# Patient Record
Sex: Female | Born: 1950 | Race: White | Hispanic: No | Smoking: Former smoker
Health system: Southern US, Community
[De-identification: ages and names within clinical notes are randomized; demographics above are authoritative.]

## PROBLEM LIST (undated history)

## (undated) DIAGNOSIS — C719 Malignant neoplasm of brain, unspecified: Secondary | ICD-10-CM

## (undated) DIAGNOSIS — IMO0001 Reserved for inherently not codable concepts without codable children: Secondary | ICD-10-CM

## (undated) DIAGNOSIS — C801 Malignant (primary) neoplasm, unspecified: Secondary | ICD-10-CM

## (undated) DIAGNOSIS — F319 Bipolar disorder, unspecified: Secondary | ICD-10-CM

## (undated) DIAGNOSIS — IMO0002 Reserved for concepts with insufficient information to code with codable children: Secondary | ICD-10-CM

## (undated) DIAGNOSIS — R569 Unspecified convulsions: Secondary | ICD-10-CM

## (undated) HISTORY — DX: Reserved for inherently not codable concepts without codable children: IMO0001

## (undated) HISTORY — DX: Reserved for concepts with insufficient information to code with codable children: IMO0002

---

## 2001-03-09 ENCOUNTER — Emergency Department (HOSPITAL_COMMUNITY): Admission: EM | Admit: 2001-03-09 | Discharge: 2001-03-09 | Payer: Self-pay | Admitting: Emergency Medicine

## 2004-08-21 ENCOUNTER — Emergency Department (HOSPITAL_COMMUNITY): Admission: EM | Admit: 2004-08-21 | Discharge: 2004-08-21 | Payer: Self-pay | Admitting: Emergency Medicine

## 2004-11-14 ENCOUNTER — Emergency Department (HOSPITAL_COMMUNITY): Admission: EM | Admit: 2004-11-14 | Discharge: 2004-11-14 | Payer: Self-pay | Admitting: Emergency Medicine

## 2004-11-16 ENCOUNTER — Emergency Department (HOSPITAL_COMMUNITY): Admission: EM | Admit: 2004-11-16 | Discharge: 2004-11-17 | Payer: Self-pay | Admitting: Emergency Medicine

## 2004-11-19 ENCOUNTER — Emergency Department (HOSPITAL_COMMUNITY): Admission: EM | Admit: 2004-11-19 | Discharge: 2004-11-19 | Payer: Self-pay | Admitting: *Deleted

## 2004-11-29 ENCOUNTER — Ambulatory Visit: Payer: Self-pay | Admitting: Internal Medicine

## 2004-11-30 ENCOUNTER — Ambulatory Visit: Payer: Self-pay | Admitting: Internal Medicine

## 2004-12-03 ENCOUNTER — Ambulatory Visit: Payer: Self-pay | Admitting: *Deleted

## 2004-12-16 ENCOUNTER — Emergency Department (HOSPITAL_COMMUNITY): Admission: EM | Admit: 2004-12-16 | Discharge: 2004-12-16 | Payer: Self-pay | Admitting: Emergency Medicine

## 2004-12-17 ENCOUNTER — Emergency Department (HOSPITAL_COMMUNITY): Admission: EM | Admit: 2004-12-17 | Discharge: 2004-12-17 | Payer: Self-pay | Admitting: Emergency Medicine

## 2004-12-28 ENCOUNTER — Ambulatory Visit: Payer: Self-pay | Admitting: Internal Medicine

## 2005-02-12 ENCOUNTER — Ambulatory Visit (HOSPITAL_COMMUNITY): Admission: RE | Admit: 2005-02-12 | Discharge: 2005-02-12 | Payer: Self-pay | Admitting: *Deleted

## 2006-08-20 ENCOUNTER — Ambulatory Visit: Payer: Self-pay | Admitting: Internal Medicine

## 2006-12-16 ENCOUNTER — Encounter: Admission: RE | Admit: 2006-12-16 | Discharge: 2006-12-16 | Payer: Self-pay | Admitting: Sports Medicine

## 2006-12-17 ENCOUNTER — Encounter (INDEPENDENT_AMBULATORY_CARE_PROVIDER_SITE_OTHER): Payer: Self-pay | Admitting: Sports Medicine

## 2006-12-17 ENCOUNTER — Encounter (INDEPENDENT_AMBULATORY_CARE_PROVIDER_SITE_OTHER): Payer: Self-pay | Admitting: *Deleted

## 2006-12-19 ENCOUNTER — Ambulatory Visit: Payer: Self-pay | Admitting: Sports Medicine

## 2006-12-19 DIAGNOSIS — F319 Bipolar disorder, unspecified: Secondary | ICD-10-CM | POA: Insufficient documentation

## 2006-12-19 DIAGNOSIS — C3490 Malignant neoplasm of unspecified part of unspecified bronchus or lung: Secondary | ICD-10-CM

## 2006-12-19 DIAGNOSIS — F17200 Nicotine dependence, unspecified, uncomplicated: Secondary | ICD-10-CM

## 2006-12-19 DIAGNOSIS — I10 Essential (primary) hypertension: Secondary | ICD-10-CM | POA: Insufficient documentation

## 2006-12-19 LAB — CONVERTED CEMR LAB
ALT: 15 units/L (ref 0–35)
AST: 15 units/L (ref 0–37)
Albumin: 4.1 g/dL (ref 3.5–5.2)
Alkaline Phosphatase: 94 units/L (ref 39–117)
BUN: 13 mg/dL (ref 6–23)
Chloride: 104 meq/L (ref 96–112)
Glucose, Bld: 107 mg/dL — ABNORMAL HIGH (ref 70–99)
Sodium: 139 meq/L (ref 135–145)
TSH: 1.361 microintl units/mL (ref 0.350–5.50)
Total Bilirubin: 0.6 mg/dL (ref 0.3–1.2)
Total Protein: 8 g/dL (ref 6.0–8.3)

## 2006-12-26 ENCOUNTER — Ambulatory Visit: Payer: Self-pay | Admitting: Internal Medicine

## 2007-01-02 ENCOUNTER — Ambulatory Visit: Payer: Self-pay | Admitting: Internal Medicine

## 2007-01-02 ENCOUNTER — Ambulatory Visit: Admission: RE | Admit: 2007-01-02 | Discharge: 2007-01-02 | Payer: Self-pay | Admitting: Internal Medicine

## 2007-01-02 ENCOUNTER — Encounter (INDEPENDENT_AMBULATORY_CARE_PROVIDER_SITE_OTHER): Payer: Self-pay | Admitting: Specialist

## 2007-01-06 ENCOUNTER — Telehealth (INDEPENDENT_AMBULATORY_CARE_PROVIDER_SITE_OTHER): Payer: Self-pay | Admitting: *Deleted

## 2007-01-08 ENCOUNTER — Ambulatory Visit: Payer: Self-pay | Admitting: Hematology & Oncology

## 2007-01-13 LAB — CBC WITH DIFFERENTIAL/PLATELET
BASO%: 0.3 % (ref 0.0–2.0)
Basophils Absolute: 0 10*3/uL (ref 0.0–0.1)
EOS%: 1.2 % (ref 0.0–7.0)
HCT: 45.5 % (ref 34.8–46.6)
HGB: 15.8 g/dL (ref 11.6–15.9)
MCH: 28.4 pg (ref 26.0–34.0)
MCHC: 34.8 g/dL (ref 32.0–36.0)
MCV: 81.4 fL (ref 81.0–101.0)
MONO%: 5.2 % (ref 0.0–13.0)
NEUT%: 62.7 % (ref 39.6–76.8)
RDW: 14.8 % — ABNORMAL HIGH (ref 11.3–14.5)
lymph#: 2.4 10*3/uL (ref 0.9–3.3)

## 2007-01-13 LAB — COMPREHENSIVE METABOLIC PANEL
ALT: 17 U/L (ref 0–35)
AST: 15 U/L (ref 0–37)
Alkaline Phosphatase: 96 U/L (ref 39–117)
BUN: 14 mg/dL (ref 6–23)
Chloride: 109 mEq/L (ref 96–112)
Creatinine, Ser: 0.81 mg/dL (ref 0.40–1.20)

## 2007-01-22 ENCOUNTER — Ambulatory Visit (HOSPITAL_COMMUNITY): Admission: RE | Admit: 2007-01-22 | Discharge: 2007-01-22 | Payer: Self-pay | Admitting: Hematology & Oncology

## 2007-02-10 ENCOUNTER — Ambulatory Visit (HOSPITAL_COMMUNITY): Admission: RE | Admit: 2007-02-10 | Discharge: 2007-02-10 | Payer: Self-pay | Admitting: Hematology & Oncology

## 2007-02-13 ENCOUNTER — Ambulatory Visit: Admission: RE | Admit: 2007-02-13 | Discharge: 2007-04-30 | Payer: Self-pay | Admitting: Radiation Oncology

## 2007-02-24 ENCOUNTER — Ambulatory Visit: Payer: Self-pay | Admitting: Hematology & Oncology

## 2007-02-24 LAB — CBC WITH DIFFERENTIAL/PLATELET
Basophils Absolute: 0 10*3/uL (ref 0.0–0.1)
EOS%: 0.5 % (ref 0.0–7.0)
Eosinophils Absolute: 0.1 10*3/uL (ref 0.0–0.5)
HCT: 36.6 % (ref 34.8–46.6)
HGB: 12.5 g/dL (ref 11.6–15.9)
LYMPH%: 18.3 % (ref 14.0–48.0)
MCH: 28 pg (ref 26.0–34.0)
MCV: 81.7 fL (ref 81.0–101.0)
MONO%: 1.2 % (ref 0.0–13.0)
NEUT#: 9.3 10*3/uL — ABNORMAL HIGH (ref 1.5–6.5)
NEUT%: 79.9 % — ABNORMAL HIGH (ref 39.6–76.8)
Platelets: 120 10*3/uL — ABNORMAL LOW (ref 145–400)

## 2007-02-24 LAB — COMPREHENSIVE METABOLIC PANEL
Albumin: 3.7 g/dL (ref 3.5–5.2)
Alkaline Phosphatase: 96 U/L (ref 39–117)
BUN: 13 mg/dL (ref 6–23)
CO2: 26 mEq/L (ref 19–32)
Calcium: 8.9 mg/dL (ref 8.4–10.5)
Glucose, Bld: 85 mg/dL (ref 70–99)
Potassium: 4.1 mEq/L (ref 3.5–5.3)

## 2007-03-04 ENCOUNTER — Encounter: Payer: Self-pay | Admitting: Family Medicine

## 2007-03-04 LAB — CBC WITH DIFFERENTIAL/PLATELET
Basophils Absolute: 0.1 10*3/uL (ref 0.0–0.1)
Eosinophils Absolute: 0 10*3/uL (ref 0.0–0.5)
HGB: 13.7 g/dL (ref 11.6–15.9)
MCV: 79.8 fL — ABNORMAL LOW (ref 81.0–101.0)
MONO#: 1.4 10*3/uL — ABNORMAL HIGH (ref 0.1–0.9)
NEUT#: 8.8 10*3/uL — ABNORMAL HIGH (ref 1.5–6.5)
RDW: 12.6 % (ref 11.3–14.5)
WBC: 13.7 10*3/uL — ABNORMAL HIGH (ref 3.9–10.0)
lymph#: 3.3 10*3/uL (ref 0.9–3.3)

## 2007-03-10 LAB — CBC WITH DIFFERENTIAL/PLATELET
Basophils Absolute: 0.1 10*3/uL (ref 0.0–0.1)
Eosinophils Absolute: 0 10*3/uL (ref 0.0–0.5)
HCT: 38.7 % (ref 34.8–46.6)
HGB: 13.2 g/dL (ref 11.6–15.9)
LYMPH%: 9.2 % — ABNORMAL LOW (ref 14.0–48.0)
MCV: 82.1 fL (ref 81.0–101.0)
MONO#: 0.2 10*3/uL (ref 0.1–0.9)
MONO%: 0.7 % (ref 0.0–13.0)
NEUT#: 21.2 10*3/uL — ABNORMAL HIGH (ref 1.5–6.5)
Platelets: 373 10*3/uL (ref 145–400)
WBC: 23.6 10*3/uL — ABNORMAL HIGH (ref 3.9–10.0)

## 2007-03-16 ENCOUNTER — Encounter: Payer: Self-pay | Admitting: Family Medicine

## 2007-03-17 LAB — CBC WITH DIFFERENTIAL/PLATELET
BASO%: 0.1 % (ref 0.0–2.0)
Eosinophils Absolute: 0 10*3/uL (ref 0.0–0.5)
HCT: 34.6 % — ABNORMAL LOW (ref 34.8–46.6)
LYMPH%: 11 % — ABNORMAL LOW (ref 14.0–48.0)
MCHC: 34.3 g/dL (ref 32.0–36.0)
MCV: 83.2 fL (ref 81.0–101.0)
MONO#: 0.8 10*3/uL (ref 0.1–0.9)
MONO%: 6.2 % (ref 0.0–13.0)
NEUT#: 11.2 10*3/uL — ABNORMAL HIGH (ref 1.5–6.5)
NEUT%: 82.4 % — ABNORMAL HIGH (ref 39.6–76.8)
Platelets: 150 10*3/uL (ref 145–400)
RBC: 4.15 10*6/uL (ref 3.70–5.32)

## 2007-03-25 ENCOUNTER — Ambulatory Visit: Payer: Self-pay | Admitting: Hematology & Oncology

## 2007-03-25 LAB — COMPREHENSIVE METABOLIC PANEL
AST: 13 U/L (ref 0–37)
Albumin: 3.5 g/dL (ref 3.5–5.2)
Alkaline Phosphatase: 88 U/L (ref 39–117)
BUN: 9 mg/dL (ref 6–23)
Calcium: 8.9 mg/dL (ref 8.4–10.5)
Chloride: 99 mEq/L (ref 96–112)
Glucose, Bld: 147 mg/dL — ABNORMAL HIGH (ref 70–99)
Potassium: 4.4 mEq/L (ref 3.5–5.3)
Sodium: 135 mEq/L (ref 135–145)
Total Protein: 6.9 g/dL (ref 6.0–8.3)

## 2007-03-25 LAB — CBC WITH DIFFERENTIAL/PLATELET
Basophils Absolute: 0.1 10*3/uL (ref 0.0–0.1)
Eosinophils Absolute: 0.2 10*3/uL (ref 0.0–0.5)
HGB: 12.8 g/dL (ref 11.6–15.9)
MCV: 83 fL (ref 81.0–101.0)
NEUT#: 5.8 10*3/uL (ref 1.5–6.5)
RDW: 13.7 % (ref 11.3–14.5)
lymph#: 1 10*3/uL (ref 0.9–3.3)

## 2007-03-31 LAB — CBC WITH DIFFERENTIAL/PLATELET
BASO%: 0.1 % (ref 0.0–2.0)
EOS%: 0.5 % (ref 0.0–7.0)
MCH: 29 pg (ref 26.0–34.0)
MCV: 82.6 fL (ref 81.0–101.0)
MONO%: 0.9 % (ref 0.0–13.0)
RBC: 4.18 10*6/uL (ref 3.70–5.32)
RDW: 16.6 % — ABNORMAL HIGH (ref 11.3–14.5)
lymph#: 0.7 10*3/uL — ABNORMAL LOW (ref 0.9–3.3)

## 2007-04-15 LAB — COMPREHENSIVE METABOLIC PANEL
Albumin: 3.8 g/dL (ref 3.5–5.2)
Alkaline Phosphatase: 102 U/L (ref 39–117)
CO2: 23 mEq/L (ref 19–32)
Calcium: 9.1 mg/dL (ref 8.4–10.5)
Chloride: 103 mEq/L (ref 96–112)
Glucose, Bld: 106 mg/dL — ABNORMAL HIGH (ref 70–99)
Potassium: 4.3 mEq/L (ref 3.5–5.3)
Sodium: 137 mEq/L (ref 135–145)
Total Protein: 6.7 g/dL (ref 6.0–8.3)

## 2007-04-15 LAB — CBC WITH DIFFERENTIAL/PLATELET
Eosinophils Absolute: 0.1 10*3/uL (ref 0.0–0.5)
HGB: 11.9 g/dL (ref 11.6–15.9)
MONO#: 0.5 10*3/uL (ref 0.1–0.9)
MONO%: 11.7 % (ref 0.0–13.0)
NEUT#: 2.8 10*3/uL (ref 1.5–6.5)
RBC: 4.06 10*6/uL (ref 3.70–5.32)
RDW: 15.5 % — ABNORMAL HIGH (ref 11.3–14.5)
WBC: 4.2 10*3/uL (ref 3.9–10.0)
lymph#: 0.8 10*3/uL — ABNORMAL LOW (ref 0.9–3.3)

## 2007-04-21 ENCOUNTER — Encounter: Payer: Self-pay | Admitting: Family Medicine

## 2007-06-08 ENCOUNTER — Ambulatory Visit: Payer: Self-pay | Admitting: Hematology & Oncology

## 2007-06-10 LAB — COMPREHENSIVE METABOLIC PANEL
AST: 11 U/L (ref 0–37)
Albumin: 4 g/dL (ref 3.5–5.2)
Alkaline Phosphatase: 91 U/L (ref 39–117)
Potassium: 4.1 mEq/L (ref 3.5–5.3)
Sodium: 135 mEq/L (ref 135–145)
Total Protein: 7 g/dL (ref 6.0–8.3)

## 2007-06-10 LAB — CBC WITH DIFFERENTIAL/PLATELET
EOS%: 3.6 % (ref 0.0–7.0)
Eosinophils Absolute: 0.2 10*3/uL (ref 0.0–0.5)
MCH: 30.9 pg (ref 26.0–34.0)
MCV: 87.9 fL (ref 81.0–101.0)
MONO%: 8.2 % (ref 0.0–13.0)
NEUT#: 3.4 10*3/uL (ref 1.5–6.5)
RBC: 4.05 10*6/uL (ref 3.70–5.32)
RDW: 16.5 % — ABNORMAL HIGH (ref 11.3–14.5)

## 2007-06-11 ENCOUNTER — Ambulatory Visit (HOSPITAL_COMMUNITY): Admission: RE | Admit: 2007-06-11 | Discharge: 2007-06-11 | Payer: Self-pay | Admitting: Hematology & Oncology

## 2007-06-29 ENCOUNTER — Encounter: Payer: Self-pay | Admitting: Family Medicine

## 2007-07-07 ENCOUNTER — Ambulatory Visit: Admission: RE | Admit: 2007-07-07 | Discharge: 2007-08-18 | Payer: Self-pay | Admitting: Radiation Oncology

## 2007-07-08 ENCOUNTER — Encounter: Payer: Self-pay | Admitting: Family Medicine

## 2007-08-18 ENCOUNTER — Ambulatory Visit: Payer: Self-pay | Admitting: Hematology & Oncology

## 2007-08-20 ENCOUNTER — Encounter: Payer: Self-pay | Admitting: Family Medicine

## 2007-08-20 LAB — COMPREHENSIVE METABOLIC PANEL
Albumin: 3.9 g/dL (ref 3.5–5.2)
BUN: 13 mg/dL (ref 6–23)
CO2: 25 mEq/L (ref 19–32)
Calcium: 8.8 mg/dL (ref 8.4–10.5)
Chloride: 103 mEq/L (ref 96–112)
Glucose, Bld: 101 mg/dL — ABNORMAL HIGH (ref 70–99)
Potassium: 4.3 mEq/L (ref 3.5–5.3)
Sodium: 140 mEq/L (ref 135–145)
Total Protein: 6.9 g/dL (ref 6.0–8.3)

## 2007-08-20 LAB — CBC WITH DIFFERENTIAL/PLATELET
Basophils Absolute: 0 10*3/uL (ref 0.0–0.1)
Eosinophils Absolute: 0.1 10*3/uL (ref 0.0–0.5)
HGB: 12.9 g/dL (ref 11.6–15.9)
MCV: 83.8 fL (ref 81.0–101.0)
MONO#: 0.4 10*3/uL (ref 0.1–0.9)
MONO%: 7 % (ref 0.0–13.0)
NEUT#: 3.8 10*3/uL (ref 1.5–6.5)
RDW: 14.3 % (ref 11.3–14.5)
WBC: 5.1 10*3/uL (ref 3.9–10.0)

## 2007-09-14 ENCOUNTER — Encounter: Payer: Self-pay | Admitting: Family Medicine

## 2007-09-15 ENCOUNTER — Ambulatory Visit (HOSPITAL_COMMUNITY): Admission: RE | Admit: 2007-09-15 | Discharge: 2007-09-15 | Payer: Self-pay | Admitting: Hematology & Oncology

## 2007-09-29 ENCOUNTER — Ambulatory Visit: Payer: Self-pay | Admitting: Hematology & Oncology

## 2007-10-01 LAB — CBC WITH DIFFERENTIAL/PLATELET
Basophils Absolute: 0 10*3/uL (ref 0.0–0.1)
Eosinophils Absolute: 0.1 10*3/uL (ref 0.0–0.5)
HGB: 13.1 g/dL (ref 11.6–15.9)
MONO%: 7.2 % (ref 0.0–13.0)
NEUT#: 5.2 10*3/uL (ref 1.5–6.5)
RBC: 4.53 10*6/uL (ref 3.70–5.32)
RDW: 15 % — ABNORMAL HIGH (ref 11.3–14.5)
WBC: 6.9 10*3/uL (ref 3.9–10.0)
lymph#: 1.1 10*3/uL (ref 0.9–3.3)

## 2007-10-07 ENCOUNTER — Encounter: Payer: Self-pay | Admitting: Family Medicine

## 2007-12-09 ENCOUNTER — Ambulatory Visit: Payer: Self-pay | Admitting: Hematology & Oncology

## 2007-12-11 LAB — CBC WITH DIFFERENTIAL/PLATELET
BASO%: 0.4 % (ref 0.0–2.0)
EOS%: 1.3 % (ref 0.0–7.0)
HGB: 13.6 g/dL (ref 11.6–15.9)
MCH: 29.3 pg (ref 26.0–34.0)
MCHC: 34.8 g/dL (ref 32.0–36.0)
MCV: 84.2 fL (ref 81.0–101.0)
MONO%: 6.5 % (ref 0.0–13.0)
RBC: 4.65 10*6/uL (ref 3.70–5.32)
RDW: 14.1 % (ref 11.3–14.5)
lymph#: 1.4 10*3/uL (ref 0.9–3.3)

## 2007-12-11 LAB — COMPREHENSIVE METABOLIC PANEL
ALT: 17 U/L (ref 0–35)
AST: 14 U/L (ref 0–37)
Albumin: 3.7 g/dL (ref 3.5–5.2)
Alkaline Phosphatase: 86 U/L (ref 39–117)
Calcium: 9 mg/dL (ref 8.4–10.5)
Chloride: 106 mEq/L (ref 96–112)
Potassium: 4.1 mEq/L (ref 3.5–5.3)
Sodium: 136 mEq/L (ref 135–145)

## 2007-12-14 ENCOUNTER — Ambulatory Visit (HOSPITAL_COMMUNITY): Admission: RE | Admit: 2007-12-14 | Discharge: 2007-12-14 | Payer: Self-pay | Admitting: Hematology & Oncology

## 2008-03-15 ENCOUNTER — Ambulatory Visit (HOSPITAL_COMMUNITY): Admission: RE | Admit: 2008-03-15 | Discharge: 2008-03-15 | Payer: Self-pay | Admitting: Hematology & Oncology

## 2008-03-16 ENCOUNTER — Ambulatory Visit: Payer: Self-pay | Admitting: Hematology & Oncology

## 2008-03-21 LAB — COMPREHENSIVE METABOLIC PANEL
ALT: 10 U/L (ref 0–35)
Alkaline Phosphatase: 75 U/L (ref 39–117)
Creatinine, Ser: 0.84 mg/dL (ref 0.40–1.20)
Sodium: 135 mEq/L (ref 135–145)
Total Bilirubin: 0.4 mg/dL (ref 0.3–1.2)
Total Protein: 6.8 g/dL (ref 6.0–8.3)

## 2008-03-21 LAB — CBC WITH DIFFERENTIAL/PLATELET
BASO%: 0.4 % (ref 0.0–2.0)
HCT: 40 % (ref 34.8–46.6)
LYMPH%: 15.4 % (ref 14.0–48.0)
MCH: 29.8 pg (ref 26.0–34.0)
MCHC: 35 g/dL (ref 32.0–36.0)
MCV: 85 fL (ref 81.0–101.0)
MONO%: 5.9 % (ref 0.0–13.0)
NEUT%: 77.5 % — ABNORMAL HIGH (ref 39.6–76.8)
Platelets: 271 10*3/uL (ref 145–400)
RBC: 4.71 10*6/uL (ref 3.70–5.32)

## 2008-06-20 ENCOUNTER — Ambulatory Visit: Payer: Self-pay | Admitting: Hematology & Oncology

## 2008-06-29 ENCOUNTER — Ambulatory Visit (HOSPITAL_COMMUNITY): Admission: RE | Admit: 2008-06-29 | Discharge: 2008-06-29 | Payer: Self-pay | Admitting: Hematology & Oncology

## 2008-06-29 LAB — CBC WITH DIFFERENTIAL/PLATELET
BASO%: 0.4 % (ref 0.0–2.0)
EOS%: 0.6 % (ref 0.0–7.0)
LYMPH%: 23.1 % (ref 14.0–48.0)
MCH: 29.6 pg (ref 26.0–34.0)
MCHC: 34.6 g/dL (ref 32.0–36.0)
MONO#: 0.4 10*3/uL (ref 0.1–0.9)
Platelets: 246 10*3/uL (ref 145–400)
RBC: 4.83 10*6/uL (ref 3.70–5.32)
WBC: 6.4 10*3/uL (ref 3.9–10.0)

## 2008-06-29 LAB — COMPREHENSIVE METABOLIC PANEL
ALT: 11 U/L (ref 0–35)
AST: 14 U/L (ref 0–37)
Alkaline Phosphatase: 76 U/L (ref 39–117)
CO2: 26 mEq/L (ref 19–32)
Sodium: 138 mEq/L (ref 135–145)
Total Bilirubin: 0.7 mg/dL (ref 0.3–1.2)
Total Protein: 7 g/dL (ref 6.0–8.3)

## 2008-07-08 ENCOUNTER — Ambulatory Visit: Payer: Self-pay | Admitting: Hematology & Oncology

## 2008-07-26 ENCOUNTER — Ambulatory Visit (HOSPITAL_COMMUNITY): Admission: RE | Admit: 2008-07-26 | Discharge: 2008-07-26 | Payer: Self-pay | Admitting: Hematology & Oncology

## 2008-08-18 ENCOUNTER — Emergency Department (HOSPITAL_COMMUNITY): Admission: EM | Admit: 2008-08-18 | Discharge: 2008-08-18 | Payer: Self-pay | Admitting: Emergency Medicine

## 2008-11-03 ENCOUNTER — Ambulatory Visit: Payer: Self-pay | Admitting: Hematology & Oncology

## 2008-11-08 ENCOUNTER — Ambulatory Visit: Payer: Self-pay | Admitting: Hematology & Oncology

## 2008-11-08 ENCOUNTER — Ambulatory Visit (HOSPITAL_COMMUNITY): Admission: RE | Admit: 2008-11-08 | Discharge: 2008-11-08 | Payer: Self-pay | Admitting: Hematology & Oncology

## 2010-02-19 ENCOUNTER — Ambulatory Visit: Payer: Self-pay | Admitting: Internal Medicine

## 2010-02-28 ENCOUNTER — Ambulatory Visit (HOSPITAL_COMMUNITY): Admission: RE | Admit: 2010-02-28 | Discharge: 2010-02-28 | Payer: Self-pay | Admitting: Cardiology

## 2010-03-15 ENCOUNTER — Encounter: Admission: RE | Admit: 2010-03-15 | Discharge: 2010-03-15 | Payer: Self-pay | Admitting: Internal Medicine

## 2010-04-12 ENCOUNTER — Ambulatory Visit: Payer: Self-pay | Admitting: Internal Medicine

## 2010-04-12 LAB — CONVERTED CEMR LAB
BUN: 18 mg/dL (ref 6–23)
CO2: 22 meq/L (ref 19–32)
Calcium: 9.1 mg/dL (ref 8.4–10.5)
Chloride: 104 meq/L (ref 96–112)
Creatinine, Ser: 0.84 mg/dL (ref 0.40–1.20)

## 2010-09-30 ENCOUNTER — Encounter: Payer: Self-pay | Admitting: Internal Medicine

## 2010-09-30 ENCOUNTER — Encounter: Payer: Self-pay | Admitting: Cardiology

## 2010-09-30 ENCOUNTER — Encounter: Payer: Self-pay | Admitting: Hematology & Oncology

## 2011-01-25 NOTE — Assessment & Plan Note (Signed)
[facility]                             PULMONARY OFFICE NOTE   [name], COGAR                        MRN:          [id number]  DATE:12/26/2006                            DOB:          1951/02/21    REFERRING PHYSICIAN:  Melina Fiddler, MD   This is a consultation requested by Dr. Melina Fiddler.   HISTORY:  A 60 year old white female active smoker with a chronic cough  dating back years associated with recent voice loss to the point where  she is completely hoarse.  She fell and hit her back several weeks ago  and then developed painful coughing, note she was coughing prior to  hitting her back, and was seen for a chest x-ray, which showed a left  upper lobe density, which on chest CT scan indeed suggested bronchogenic  carcinoma and was, therefore, seen at Dr. Hoy Register request.   She was given a round of antibiotics and states she is feeling a little  bit better, but still has a very congested cough.  Her sputum is mucoid  with no purulent or bloody component.  She denies any overt sinus or  reflux symptoms.  She has lost maybe 20 pounds since the onset of her  illness.  Denies any unusual pains, exertional, pleuritic, or otherwise,  orthopnea, PND, or leg swelling.   PAST MEDICAL HISTORY:  Significant for sleep apnea and a remote tubal  ligation.   ALLERGIES:  TETRACYCLINE, POLYCILLIN.   MEDICATIONS:  Depakote, sleep aid, a few other medicines.  She also  uses albuterol p.r.n., but says it does not help.   SOCIAL HISTORY:  She smokes a pack per day and has done so most of her  adult life.  She has no unusual travel, pet, or hobby exposure.   FAMILY HISTORY:  Recorded in detail and significant for the fact that  her brother had emphysema and was a smoker.   REVIEW OF SYSTEMS:  Otherwise, unremarkable with no cancer in direct  relatives.  Taken in detail in the worksheet and negative, except as  outlined above.   PHYSICAL  EXAMINATION:  This is a depressed-appearing ambulatory white  female with a somewhat peculiar affect and attitude.  VITAL SIGNS:  Stable vital signs.  HEENT:  She has no teeth.  Oropharynx is clear.  She smells heavily of  cigarettes.  NECK:  Supple without cervical adenopathy or tenderness. Trachea is  midline.  No thyromegaly.  LUNGS:  She is quite hoarse with localized rhonchi to the right upper  lobe anteriorly more than posteriorly.  CARDIAC:  Regular rate and rhythm without murmur, gallop, or rub, or  increase in P2.  Negative Hoover sign.  ABDOMEN:  Soft and benign.  EXTREMITIES:  Warm without calf tenderness, cyanosis, clubbing, or  edema.   CT scan of the chest was reviewed on E-chart from April 11.  It  indicates a left hilar mass with possible postobstructive pneumonia  involving the anterior aspect of the left upper lobe medially.   ASSESSMENT:  Bronchogenic carcinoma until proven otherwise with  involvement of the AP window causing new-onset hoarseness.  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   RECOMMENDATIONS:  Proceed to bronchoscopy as soon as possible.  Tentatively, I have scheduled this for December 30, 2006 after discussing  risks, benefits, and alternatives to the patient.   Obviously, she needs to commit to stop smoking sooner rather than later,  but based on her somewhat peculiar affect and attitude, and the fact  that she is on Depakote, I did not discuss this specific issue of her  care today.     Charlaine Dalton. Sherene Sires, MD, [facility]  Electronically Signed    MBW/MedQ  DD: 12/26/2006  DT: 12/27/2006  Job #: [id number]   cc:   Melina Fiddler, MD

## 2011-01-25 NOTE — Op Note (Signed)
NAMEFRANCEEN, ERISMAN NO.:  [id number]   MEDICAL RECORD NO.:  [id number]          PATIENT TYPE:  AMB   LOCATION:  CARD                         FACILITY:  [facility]   PHYSICIAN:  Casimiro Needle B. Sherene Sires, MD, FCCPDATE OF BIRTH:  Nov 18, 1950   DATE OF PROCEDURE:  01/04/2007  DATE OF DISCHARGE:  01/02/2007                               OPERATIVE REPORT   PROCEDURE:  Fiberoptic bronchoscopy with a Wang biopsy and endobronchial  biopsy of the left upper lobe orifice.   Date of procedure: 01/02/2007   HISTORY AND INDICATIONS:  Please see dictated pulmonary consultation  note available in the echart record.  The patient agreed to procedure  after full discussion of risks, benefits, alternatives.   REFERRING PHYSICIAN:  Melina Fiddler, M.D.   DESCRIPTION OF PROCEDURE:  The procedure was performed in the  bronchoscopy suite with continuous monitoring by surface ECG and  oximetry.  She received a total of 5 mg of IV Versed and 50 grams IV  Demerol throughout the procedure for adequate sedation and cough  suppression.  Initially she received 1% lidocaine by updraft nebulizer.   Using a standard flexible fiberoptic bronchoscope, the right naris was  easily cannulated good visualization entire pharynx and larynx.  The  left true vocal cord was sluggish but appeared to adduct phonation to  the midline but not as briskly as the right.   Using additional 1% lidocaine as needed.  The entire tracheobronchial  tree explored bilaterally following findings.  Trachea and carina and  all major airways were normal bilaterally to the subsegmental level with  the exception of the very distal aspect of the left mainstem bronchus.  There was marked swelling in air divider between the left upper lobe and  left lower lobe especially along the lateral aspect but no significant  obstruction of the major airways was seen.  This mass appeared to extend  into the most anterior portion of the upper lobe  with partial  obstruction of the anterior segment of the upper lobe.   PROCEDURE:  Using a Wang technique two adequate biopsies were obtained.  Additionally, two endobronchial forceps biopsies were obtained.   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   RECOMMENDATIONS:  Await tissue confirmation.   The patient will be observed until she is alert and saturating well on  room air with follow-up as outpatient arranged.      [name]. Sherene Sires, MD, [facility]  Electronically Signed     MBW/MEDQ  D:  01/04/2007  T:  01/04/2007  Job:  [id number]   cc:   Melina Fiddler, MD

## 2011-06-10 LAB — GLUCOSE, CAPILLARY: Glucose-Capillary: 88

## 2011-06-11 LAB — CBC
HCT: 41.9
MCV: 86.7
Platelets: 254
RBC: 4.83
WBC: 5.8

## 2011-06-14 LAB — BASIC METABOLIC PANEL
BUN: 12 mg/dL (ref 6–23)
CO2: 33 mEq/L — ABNORMAL HIGH (ref 19–32)
Calcium: 9.6 mg/dL (ref 8.4–10.5)
Creatinine, Ser: 0.85 mg/dL (ref 0.4–1.2)
GFR calc Af Amer: >60 mL/min (ref >60)
Glucose, Bld: 134 mg/dL — ABNORMAL HIGH (ref 70–99)

## 2011-06-14 LAB — CBC
MCHC: 33.8 g/dL (ref 30.0–36.0)
MCV: 87.2 fL (ref 78.0–100.0)
Platelets: 270 10*3/uL (ref 150–400)
RDW: 14 % (ref 11.5–15.5)

## 2011-06-14 LAB — DIFFERENTIAL
Basophils Absolute: 0 10*3/uL (ref 0.0–0.1)
Basophils Relative: 0 % (ref 0–1)
Eosinophils Absolute: 0 10*3/uL (ref 0.0–0.7)
Neutro Abs: 5.3 10*3/uL (ref 1.7–7.7)
Neutrophils Relative %: 78 % — ABNORMAL HIGH (ref 43–77)

## 2011-06-27 LAB — CBC
MCHC: 34.4
Platelets: 352
RBC: 5.73 — ABNORMAL HIGH
WBC: 9.6

## 2014-06-17 ENCOUNTER — Emergency Department (HOSPITAL_COMMUNITY): Payer: Medicaid Other

## 2014-06-17 ENCOUNTER — Encounter (HOSPITAL_COMMUNITY): Payer: Self-pay | Admitting: Emergency Medicine

## 2014-06-17 ENCOUNTER — Inpatient Hospital Stay (HOSPITAL_COMMUNITY)
Admission: EM | Admit: 2014-06-17 | Discharge: 2014-06-28 | DRG: 025 | Disposition: A | Discharge status: Rehabilitation Facility | Payer: Medicaid Other | Attending: Neurosurgery | Admitting: Neurosurgery

## 2014-06-17 DIAGNOSIS — R1310 Dysphagia, unspecified: Secondary | ICD-10-CM | POA: Diagnosis present

## 2014-06-17 DIAGNOSIS — F319 Bipolar disorder, unspecified: Secondary | ICD-10-CM | POA: Diagnosis present

## 2014-06-17 DIAGNOSIS — D496 Neoplasm of unspecified behavior of brain: Secondary | ICD-10-CM

## 2014-06-17 DIAGNOSIS — Z85118 Personal history of other malignant neoplasm of bronchus and lung: Secondary | ICD-10-CM

## 2014-06-17 DIAGNOSIS — C7931 Secondary malignant neoplasm of brain: Principal | ICD-10-CM | POA: Diagnosis present

## 2014-06-17 DIAGNOSIS — Z9181 History of falling: Secondary | ICD-10-CM

## 2014-06-17 DIAGNOSIS — G936 Cerebral edema: Secondary | ICD-10-CM | POA: Diagnosis present

## 2014-06-17 DIAGNOSIS — Z923 Personal history of irradiation: Secondary | ICD-10-CM

## 2014-06-17 DIAGNOSIS — E119 Type 2 diabetes mellitus without complications: Secondary | ICD-10-CM | POA: Diagnosis present

## 2014-06-17 DIAGNOSIS — F17210 Nicotine dependence, cigarettes, uncomplicated: Secondary | ICD-10-CM | POA: Diagnosis present

## 2014-06-17 DIAGNOSIS — R471 Dysarthria and anarthria: Secondary | ICD-10-CM | POA: Diagnosis present

## 2014-06-17 DIAGNOSIS — I619 Nontraumatic intracerebral hemorrhage, unspecified: Secondary | ICD-10-CM

## 2014-06-17 DIAGNOSIS — W19XXXA Unspecified fall, initial encounter: Secondary | ICD-10-CM | POA: Diagnosis present

## 2014-06-17 HISTORY — DX: Bipolar disorder, unspecified: F31.9

## 2014-06-17 HISTORY — DX: Malignant (primary) neoplasm, unspecified: C80.1

## 2014-06-17 LAB — URINE MICROSCOPIC-ADD ON

## 2014-06-17 LAB — CBC WITH DIFFERENTIAL/PLATELET
Basophils Absolute: 0 10*3/uL (ref 0.0–0.1)
Basophils Relative: 0 % (ref 0–1)
EOS ABS: 0.1 10*3/uL (ref 0.0–0.7)
EOS PCT: 1 % (ref 0–5)
HCT: 43.5 % (ref 36.0–46.0)
HEMOGLOBIN: 15 g/dL (ref 12.0–15.0)
LYMPHS ABS: 2 10*3/uL (ref 0.7–4.0)
Lymphocytes Relative: 27 % (ref 12–46)
MCH: 29.5 pg (ref 26.0–34.0)
MCHC: 34.5 g/dL (ref 30.0–36.0)
MCV: 85.5 fL (ref 78.0–100.0)
MONOS PCT: 9 % (ref 3–12)
Monocytes Absolute: 0.7 10*3/uL (ref 0.1–1.0)
Neutro Abs: 4.5 10*3/uL (ref 1.7–7.7)
Neutrophils Relative %: 63 % (ref 43–77)
PLATELETS: 224 10*3/uL (ref 150–400)
RBC: 5.09 MIL/uL (ref 3.87–5.11)
RDW: 13.3 % (ref 11.5–15.5)
WBC: 7.2 10*3/uL (ref 4.0–10.5)

## 2014-06-17 LAB — URINALYSIS, ROUTINE W REFLEX MICROSCOPIC
BILIRUBIN URINE: NEGATIVE
Glucose, UA: NEGATIVE mg/dL
Ketones, ur: NEGATIVE mg/dL
Leukocytes, UA: NEGATIVE
Nitrite: NEGATIVE
Protein, ur: NEGATIVE mg/dL
SPECIFIC GRAVITY, URINE: 1.005 (ref 1.005–1.030)
UROBILINOGEN UA: 1 mg/dL (ref 0.0–1.0)
pH: 6 (ref 5.0–8.0)

## 2014-06-17 LAB — I-STAT CHEM 8, ED
BUN: 8 mg/dL (ref 6–23)
CALCIUM ION: 1.11 mmol/L — AB (ref 1.13–1.30)
CREATININE: 0.8 mg/dL (ref 0.50–1.10)
Chloride: 99 mEq/L (ref 96–112)
GLUCOSE: 95 mg/dL (ref 70–99)
HCT: 48 % — ABNORMAL HIGH (ref 36.0–46.0)
HEMOGLOBIN: 16.3 g/dL — AB (ref 12.0–15.0)
Potassium: 4.1 mEq/L (ref 3.7–5.3)
Sodium: 135 mEq/L — ABNORMAL LOW (ref 137–147)
TCO2: 29 mmol/L (ref 0–100)

## 2014-06-17 LAB — RAPID URINE DRUG SCREEN, HOSP PERFORMED
Amphetamines: NOT DETECTED
BARBITURATES: NOT DETECTED
Benzodiazepines: NOT DETECTED
Cocaine: NOT DETECTED
Opiates: NOT DETECTED
TETRAHYDROCANNABINOL: NOT DETECTED

## 2014-06-17 NOTE — ED Notes (Signed)
Pt and family state pt has had increased falling in last 2 weeks, pt states she has been tripping and falling, c/o pain to buttocks.

## 2014-06-17 NOTE — ED Notes (Signed)
MD at bedside. 

## 2014-06-18 ENCOUNTER — Inpatient Hospital Stay (HOSPITAL_COMMUNITY): Payer: Medicaid Other

## 2014-06-18 ENCOUNTER — Encounter (HOSPITAL_COMMUNITY): Payer: Self-pay | Admitting: Emergency Medicine

## 2014-06-18 DIAGNOSIS — I619 Nontraumatic intracerebral hemorrhage, unspecified: Secondary | ICD-10-CM | POA: Diagnosis present

## 2014-06-18 DIAGNOSIS — Z923 Personal history of irradiation: Secondary | ICD-10-CM | POA: Diagnosis not present

## 2014-06-18 DIAGNOSIS — R471 Dysarthria and anarthria: Secondary | ICD-10-CM | POA: Diagnosis present

## 2014-06-18 DIAGNOSIS — F319 Bipolar disorder, unspecified: Secondary | ICD-10-CM | POA: Diagnosis present

## 2014-06-18 DIAGNOSIS — R1310 Dysphagia, unspecified: Secondary | ICD-10-CM | POA: Diagnosis present

## 2014-06-18 DIAGNOSIS — F329 Major depressive disorder, single episode, unspecified: Secondary | ICD-10-CM | POA: Diagnosis not present

## 2014-06-18 DIAGNOSIS — C7931 Secondary malignant neoplasm of brain: Secondary | ICD-10-CM | POA: Diagnosis present

## 2014-06-18 DIAGNOSIS — F17210 Nicotine dependence, cigarettes, uncomplicated: Secondary | ICD-10-CM | POA: Diagnosis present

## 2014-06-18 DIAGNOSIS — Z85118 Personal history of other malignant neoplasm of bronchus and lung: Secondary | ICD-10-CM | POA: Diagnosis not present

## 2014-06-18 DIAGNOSIS — Z9181 History of falling: Secondary | ICD-10-CM | POA: Diagnosis not present

## 2014-06-18 DIAGNOSIS — Z720 Tobacco use: Secondary | ICD-10-CM | POA: Diagnosis not present

## 2014-06-18 DIAGNOSIS — D496 Neoplasm of unspecified behavior of brain: Secondary | ICD-10-CM | POA: Diagnosis present

## 2014-06-18 DIAGNOSIS — W19XXXA Unspecified fall, initial encounter: Secondary | ICD-10-CM | POA: Diagnosis present

## 2014-06-18 DIAGNOSIS — E119 Type 2 diabetes mellitus without complications: Secondary | ICD-10-CM | POA: Diagnosis present

## 2014-06-18 DIAGNOSIS — G936 Cerebral edema: Secondary | ICD-10-CM | POA: Diagnosis present

## 2014-06-18 LAB — VALPROIC ACID LEVEL: Valproic Acid Lvl: 30.7 ug/mL — ABNORMAL LOW (ref 50.0–100.0)

## 2014-06-18 LAB — GLUCOSE, CAPILLARY: Glucose-Capillary: 108 mg/dL — ABNORMAL HIGH (ref 70–99)

## 2014-06-18 LAB — MRSA PCR SCREENING: MRSA BY PCR: NEGATIVE

## 2014-06-18 MED ORDER — LEVETIRACETAM IN NACL 1000 MG/100ML IV SOLN
1000.0000 mg | Freq: Once | INTRAVENOUS | Status: AC
  Administered 2014-06-18: 1000 mg via INTRAVENOUS
  Filled 2014-06-18: qty 100

## 2014-06-18 MED ORDER — ONDANSETRON HCL 4 MG/2ML IJ SOLN: 4.0000 mg | Freq: Four times a day (QID) | INTRAMUSCULAR | Status: DC | PRN

## 2014-06-18 MED ORDER — GADOBENATE DIMEGLUMINE 529 MG/ML IV SOLN
10.0000 mL | Freq: Once | INTRAVENOUS | Status: AC | PRN
  Administered 2014-06-18: 10 mL via INTRAVENOUS

## 2014-06-18 MED ORDER — DEXAMETHASONE SODIUM PHOSPHATE 10 MG/ML IJ SOLN
10.0000 mg | Freq: Four times a day (QID) | INTRAMUSCULAR | Status: DC
  Administered 2014-06-18 – 2014-06-23 (×23): 10 mg via INTRAVENOUS
  Filled 2014-06-18 (×25): qty 1

## 2014-06-18 MED ORDER — ONDANSETRON HCL 4 MG PO TABS: 4.0000 mg | ORAL_TABLET | Freq: Four times a day (QID) | ORAL | Status: DC | PRN

## 2014-06-18 MED ORDER — LEVETIRACETAM 500 MG PO TABS
500.0000 mg | ORAL_TABLET | Freq: Two times a day (BID) | ORAL | Status: DC
  Administered 2014-06-18 – 2014-06-22 (×10): 500 mg via ORAL
  Filled 2014-06-18 (×14): qty 1

## 2014-06-18 MED ORDER — HYDROCODONE-ACETAMINOPHEN 5-325 MG PO TABS
1.0000 | ORAL_TABLET | ORAL | Status: DC | PRN
  Administered 2014-06-20: 2 via ORAL
  Filled 2014-06-18: qty 2

## 2014-06-18 MED ORDER — IOHEXOL 300 MG/ML  SOLN
25.0000 mL | INTRAMUSCULAR | Status: AC
  Administered 2014-06-18 (×2): 25 mL via ORAL

## 2014-06-18 MED ORDER — ALBUTEROL SULFATE (2.5 MG/3ML) 0.083% IN NEBU
5.0000 mg | INHALATION_SOLUTION | Freq: Once | RESPIRATORY_TRACT | Status: AC
  Administered 2014-06-18: 5 mg via RESPIRATORY_TRACT
  Filled 2014-06-18: qty 6

## 2014-06-18 MED ORDER — ACETAMINOPHEN 650 MG RE SUPP: 650.0000 mg | Freq: Four times a day (QID) | RECTAL | Status: DC | PRN

## 2014-06-18 MED ORDER — IOHEXOL 300 MG/ML  SOLN
100.0000 mL | Freq: Once | INTRAMUSCULAR | Status: AC | PRN
  Administered 2014-06-18: 100 mL via INTRAVENOUS

## 2014-06-18 MED ORDER — ACETAMINOPHEN 325 MG PO TABS: 650.0000 mg | ORAL_TABLET | Freq: Four times a day (QID) | ORAL | Status: DC | PRN

## 2014-06-18 MED ORDER — DOCUSATE SODIUM 100 MG PO CAPS
100.0000 mg | ORAL_CAPSULE | Freq: Two times a day (BID) | ORAL | Status: DC
  Administered 2014-06-18 – 2014-06-22 (×10): 100 mg via ORAL
  Filled 2014-06-18 (×14): qty 1

## 2014-06-18 MED ORDER — PANTOPRAZOLE SODIUM 40 MG PO TBEC
40.0000 mg | DELAYED_RELEASE_TABLET | Freq: Every day | ORAL | Status: DC
  Administered 2014-06-18 – 2014-06-22 (×5): 40 mg via ORAL
  Filled 2014-06-18 (×7): qty 1

## 2014-06-18 MED ORDER — DEXAMETHASONE SODIUM PHOSPHATE 10 MG/ML IJ SOLN
10.0000 mg | Freq: Once | INTRAMUSCULAR | Status: AC
  Administered 2014-06-18: 10 mg via INTRAVENOUS
  Filled 2014-06-18: qty 1

## 2014-06-18 MED ORDER — LACTATED RINGERS IV SOLN: INTRAVENOUS | Status: DC

## 2014-06-18 NOTE — ED Provider Notes (Signed)
CSN: [id number]     Arrival date & time 06/17/14  2108 History   First MD Initiated Contact with Patient 06/17/14 2310     Chief Complaint  Patient presents with  . Fall     (Consider location/radiation/quality/duration/timing/severity/associated sxs/prior Treatment) Patient is a 63 y.o. female presenting with fall. The history is provided by the patient. No language interpreter was used.  Fall This is a recurrent problem. The current episode started more than 1 week ago. The problem occurs every several days. The problem has not changed since onset.Pertinent negatives include no chest pain, no abdominal pain, no headaches and no shortness of breath. Nothing aggravates the symptoms. Nothing relieves the symptoms. She has tried nothing for the symptoms. The treatment provided no relief.    Past Medical History  Diagnosis Date  . Bipolar disorder   . Cancer    History reviewed. No pertinent past surgical history. History reviewed. No pertinent family history. History  Substance Use Topics  . Smoking status: Current Every Day Smoker  . Smokeless tobacco: Not on file  . Alcohol Use: No   OB History   Grav Para Term Preterm Abortions TAB SAB Ect Mult Living                 Review of Systems  Constitutional: Negative for fever.  HENT: Negative for drooling.   Respiratory: Negative for shortness of breath.   Cardiovascular: Negative for chest pain.  Gastrointestinal: Negative for abdominal pain.  Genitourinary: Negative for flank pain.  Neurological: Negative for headaches.  All other systems reviewed and are negative.     Allergies  Review of patient's allergies indicates no known allergies.  Home Medications   Prior to Admission medications   Medication Sig Start Date End Date Taking? Authorizing Provider  metFORMIN (GLUCOPHAGE) 1000 MG tablet Take 1,000 mg by mouth 2 (two) times daily with a meal.    Yes Historical Provider, MD  risperiDONE (RISPERDAL) 2 MG tablet  Take 4 mg by mouth at bedtime.   Yes Historical Provider, MD   BP 138/77  Pulse 82  Temp(Src) 97.9 F (36.6 C) (Oral)  Resp 19  Ht 5\' 1"  (1.549 m)  Wt 117 lb (53.071 kg)  BMI 22.12 kg/m2  SpO2 96% Physical Exam  Constitutional: She appears well-developed and well-nourished. No distress.  HENT:  Head: Normocephalic and atraumatic.  Right Ear: External ear normal.  Left Ear: External ear normal.  Mouth/Throat: Oropharynx is clear and moist. No oropharyngeal exudate.  Eyes: Conjunctivae and EOM are normal. Pupils are equal, round, and reactive to light.  Neck: Normal range of motion. Neck supple.  Cardiovascular: Normal rate, regular rhythm and intact distal pulses.   Pulmonary/Chest: She has decreased breath sounds. She has no rales.  Abdominal: Soft. Bowel sounds are normal. There is no tenderness. There is no rebound and no guarding.  Musculoskeletal: Normal range of motion. She exhibits no edema.  Neurological: She is alert. She has normal reflexes. No cranial nerve deficit.  Skin: Skin is warm and dry.  Psychiatric: She has a normal mood and affect.    ED Course  Procedures (including critical care time) Labs Review Labs Reviewed  URINALYSIS, ROUTINE W REFLEX MICROSCOPIC - Abnormal; Notable for the following:    Hgb urine dipstick TRACE (*)    All other components within normal limits  I-STAT CHEM 8, ED - Abnormal; Notable for the following:    Sodium 135 (*)    Calcium, Ion 1.11 (*)  Hemoglobin 16.3 (*)    HCT 48.0 (*)    All other components within normal limits  CBC WITH DIFFERENTIAL  URINE RAPID DRUG SCREEN (HOSP PERFORMED)  URINE MICROSCOPIC-ADD ON  VALPROIC ACID LEVEL    Imaging Review Dg Chest 2 View  06/18/2014   CLINICAL DATA:  Multiple falls in the past 2 weeks, with pain in the buttocks. Current history of smoking.  EXAM: CHEST  2 VIEW  COMPARISON:  Chest radiograph performed 08/18/2008, and CT of the chest performed 11/08/2008  FINDINGS: There is  more prominent focal soft tissue density at the prior site of postradiation change, in the left suprahilar region. Though this may simply be chronic in nature, recurrent mass is a concern. The right lung appears clear. No pleural effusion or pneumothorax is seen.  The cardiomediastinal silhouette is normal in size. No acute osseous abnormalities are identified.  IMPRESSION: More prominent focal soft tissue density at the prior site of postradiation change at the left suprahilar region. Though this may simply be chronic in nature, recurrent mass is a concern. PET/CT could be considered further evaluation, as deemed clinically appropriate.   Electronically Signed   By: Garald Balding M.D.   On: 06/18/2014 00:07   Ct Head Wo Contrast  06/18/2014   CLINICAL DATA:  Slurred speech. Increased falling for 2 weeks. Initial valuation.  EXAM: CT HEAD WITHOUT CONTRAST  TECHNIQUE: Contiguous axial images were obtained from the base of the skull through the vertex without intravenous contrast.  COMPARISON:  08/18/2008.  FINDINGS: Left frontal petechial hemorrhages are noted. Diffuse left frontal white matter lucency noted, this is new. This could represent edema. Underlying tumor and/or ischemia could present in this fashion. Diffuse mild cerebral and cerebellar atrophy is present. No mass effect or midline shift. Tiny punctate calcified density noted in the left frontal region. No acute bony abnormality. Metallic density noted right facial region.  IMPRESSION: 1. Punctate petechial type hemorrhage noted within the left frontal lobe.  2. Left frontal parietal diffuse white matter edema. Underlying tumor or ischemia cannot be excluded. No significant mass effect.  These results were called by telephone at the time of interpretation on 06/18/2014 at 12:30 am to Dr. Maggie Schwalbe , who verbally acknowledged these results.   Electronically Signed   By: Marcello Moores  Register   On: 06/18/2014 00:33     EKG  Interpretation None      MDM   Final diagnoses:  None     Date: 06/18/2014  Rate: 82  Rhythm: normal sinus rhythm  QRS Axis: normal  Intervals: normal  ST/T Wave abnormalities: normal  Conduction Disutrbances: none  Narrative Interpretation: unremarkable     Medications  lactated ringers infusion (not administered)  dexamethasone (DECADRON) injection 10 mg (not administered)  levETIRAcetam (KEPPRA) tablet 500 mg (not administered)  pantoprazole (PROTONIX) EC tablet 40 mg (not administered)  acetaminophen (TYLENOL) tablet 650 mg (not administered)    Or  acetaminophen (TYLENOL) suppository 650 mg (not administered)  HYDROcodone-acetaminophen (NORCO/VICODIN) 5-325 MG per tablet 1-2 tablet (not administered)  docusate sodium (COLACE) capsule 100 mg (not administered)  ondansetron (ZOFRAN) tablet 4 mg (not administered)    Or  ondansetron (ZOFRAN) injection 4 mg (not administered)  albuterol (PROVENTIL) (2.5 MG/3ML) 0.083% nebulizer solution 5 mg (5 mg Nebulization Given 06/18/14 0025)  dexamethasone (DECADRON) injection 10 mg (10 mg Intravenous Given 06/18/14 0042)  levETIRAcetam (KEPPRA) IVPB 1000 mg/100 mL premix (1,000 mg Intravenous Given 06/18/14 0043)     Case d/w Dr.  Jenkins of neurosurgery.  Transfer to [place], MD 06/18/14 [phone]

## 2014-06-18 NOTE — ED Notes (Signed)
[facility] ETA 10 minutes

## 2014-06-18 NOTE — Progress Notes (Signed)
Nutrition Brief Note  Patient identified on the Malnutrition Screening Tool (MST) Report  Wt Readings from Last 15 Encounters:  06/18/14 117 lb 11.6 oz (53.4 kg)  12/19/06 178 lb 4.8 oz (80.876 kg)    Body mass index is 22.26 kg/(m^2). Patient meets criteria for normal based on current BMI.   Pt reports she has a good appetite and has been eating well here while hospitalized and prior to admission at home. Pt reports her weight has been stable.  Current diet order is regular, patient is consuming approximately 80-90% of meals at this time. Labs and medications reviewed.   No nutrition interventions warranted at this time. If nutrition issues arise, please consult RD.   Kallie Locks, MS, RD, Provisional LDN Pager # [phone] After hours/ weekend pager # [phone]

## 2014-06-18 NOTE — H&P (Signed)
Subjective: The patient is a 63 year old white female who by report has been using weight, falling, and had a decreased mental status. The patient was brought to the [facility] long emergency department where she was evaluated by Dr. Nicholes Stairs. The evaluation included a head CT which demonstrated a left frontal lesion. A neurosurgical consultation was requested. The patient was transferred to [facility] for further neurosurgical care.  Presently there is no family members available. The patient is not able to give a history. The patient has a history of small cell carcinoma of the lung, bipolar disorder, etc.   Past Medical History  Diagnosis Date  . Bipolar disorder   . Cancer     small call lung cancer    History reviewed. No pertinent past surgical history.  No Known Allergies  History  Substance Use Topics  . Smoking status: Current Every Day Smoker -- 1.50 packs/day  . Smokeless tobacco: Not on file  . Alcohol Use: No    History reviewed. No pertinent family history. Prior to Admission medications   Medication Sig Start Date End Date Taking? Authorizing Provider  metFORMIN (GLUCOPHAGE) 1000 MG tablet Take 1,000 mg by mouth 2 (two) times daily with a meal.    Yes Historical Provider, MD  risperiDONE (RISPERDAL) 2 MG tablet Take 4 mg by mouth at bedtime.   Yes Historical Provider, MD     Review of Systems  Positive ROS: Unobtainable  .  Objective: Vital signs in last 24 hours: Temp:  [97.9 F (36.6 C)-98.4 F (36.9 C)] 98 F (36.7 C) (10/10 0244) Pulse Rate:  [75-82] 76 (10/10 0400) Resp:  [14-19] 15 (10/10 0400) BP: (115-142)/(60-77) 119/72 mmHg (10/10 0400) SpO2:  [93 %-100 %] 98 % (10/10 0400) Weight:  [53.071 kg (117 lb)-53.4 kg (117 lb 11.6 oz)] 53.4 kg (117 lb 11.6 oz) (10/10 0244)  Physical exam:  Gen.: A pleasant, mildly somnolent and confused 63 year old white female in no apparent distress  HEENT: Normocephalic, atraumatic, pupils are equal round  reactive light. Alopecia  Neck: Supple  Thorax: Symmetric  Abdomen: Soft  Extremities: Unremarkable  Neurologic exam: The patient is somnolent but easily arousable. The patient is not able to fully cooperate with the exam She will answer simple questions occasionally. The best I can tell the patient's strength is grossly normal in her bilateral biceps, hand grip, gastrocnemius. Cerebellar and sensory exams were unable to be tested.  I have reviewed the patient's head CT performed at [facility]. The patient appears to have a left frontal brain lesion with vasogenic edema. There is a possibility of a small hemorrhage within this lesion. 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 When the family arrives I will find out who her oncologist is and asked them to see her. Assuming that this is a small cell metastasis this may be best treated with radiation. She has been started on  Decadron and Keppra. I will get a CT of the chest, abdomen, and pelvis and a brain MRI.   Emanuell Morina D 06/18/2014 5:25 AM

## 2014-06-19 LAB — GLUCOSE, CAPILLARY
Glucose-Capillary: 202 mg/dL — ABNORMAL HIGH (ref 70–99)
Glucose-Capillary: 158 mg/dL — ABNORMAL HIGH (ref 70–99)

## 2014-06-19 MED ORDER — INSULIN ASPART 100 UNIT/ML ~~LOC~~ SOLN: 0.0000 [IU] | Freq: Three times a day (TID) | SUBCUTANEOUS | Status: DC

## 2014-06-19 MED ORDER — METFORMIN HCL 500 MG PO TABS
1000.0000 mg | ORAL_TABLET | Freq: Two times a day (BID) | ORAL | Status: DC
  Administered 2014-06-21 – 2014-06-28 (×12): 1000 mg via ORAL
  Filled 2014-06-19 (×15): qty 2

## 2014-06-19 MED ORDER — RISPERIDONE 2 MG PO TABS
4.0000 mg | ORAL_TABLET | Freq: Every day | ORAL | Status: DC
  Administered 2014-06-19 – 2014-06-27 (×7): 4 mg via ORAL
  Filled 2014-06-19: qty 8
  Filled 2014-06-19: qty 2
  Filled 2014-06-19 (×2): qty 8
  Filled 2014-06-19 (×3): qty 2
  Filled 2014-06-19 (×3): qty 8
  Filled 2014-06-19 (×2): qty 2

## 2014-06-19 MED ORDER — INSULIN ASPART 100 UNIT/ML ~~LOC~~ SOLN: 0.0000 [IU] | SUBCUTANEOUS | Status: DC

## 2014-06-19 MED ORDER — INSULIN ASPART 100 UNIT/ML ~~LOC~~ SOLN
0.0000 [IU] | Freq: Three times a day (TID) | SUBCUTANEOUS | Status: DC
  Administered 2014-06-19: 5 [IU] via SUBCUTANEOUS
  Administered 2014-06-20: 2 [IU] via SUBCUTANEOUS
  Administered 2014-06-20 – 2014-06-21 (×2): 3 [IU] via SUBCUTANEOUS
  Administered 2014-06-22 (×2): 2 [IU] via SUBCUTANEOUS
  Administered 2014-06-24: 5 [IU] via SUBCUTANEOUS
  Administered 2014-06-25 (×2): 2 [IU] via SUBCUTANEOUS
  Administered 2014-06-25: 3 [IU] via SUBCUTANEOUS
  Administered 2014-06-26: 2 [IU] via SUBCUTANEOUS
  Administered 2014-06-27: 3 [IU] via SUBCUTANEOUS
  Administered 2014-06-27: 2 [IU] via SUBCUTANEOUS

## 2014-06-19 MED ORDER — INSULIN ASPART 100 UNIT/ML ~~LOC~~ SOLN
0.0000 [IU] | Freq: Three times a day (TID) | SUBCUTANEOUS | Status: DC
Start: 2014-06-19 — End: 2014-06-19

## 2014-06-19 MED ORDER — METFORMIN HCL 500 MG PO TABS: 1000.0000 mg | ORAL_TABLET | Freq: Two times a day (BID) | ORAL | Status: DC

## 2014-06-19 MED ORDER — METFORMIN HCL 500 MG PO TABS
1000.0000 mg | ORAL_TABLET | Freq: Two times a day (BID) | ORAL | Status: DC
  Filled 2014-06-19 (×2): qty 2

## 2014-06-19 NOTE — Plan of Care (Signed)
Problem: Phase I Progression Outcomes Goal: Voiding-avoid urinary catheter unless indicated Outcome: Completed/Met Date Met:  06/19/14 Pt is incontinent; She is checked every two hours

## 2014-06-19 NOTE — Progress Notes (Signed)
Patient ID: [name], female   DOB: January 04, 1951, 63 y.o.   MRN: [id number] Subjective:  The patient is alert and pleasant. She is in no apparent distress. She has no complaints. I spoke with her brother. Evidently she has not spoken with her children in years.  Objective: Vital signs in last 24 hours: Temp:  [97.8 F (36.6 C)-98.3 F (36.8 C)] 97.9 F (36.6 C) (10/11 0814) Pulse Rate:  [57-86] 58 (10/11 0700) Resp:  [13-25] 15 (10/11 0700) BP: (72-172)/(44-140) 105/58 mmHg (10/11 0700) SpO2:  [93 %-100 %] 93 % (10/11 0700) Weight:  [55.2 kg (121 lb 11.1 oz)] 55.2 kg (121 lb 11.1 oz) (10/11 0600)  Intake/Output from previous day: 10/10 0701 - 10/11 0700 In: 1200 [P.O.:1050; I.V.:150] Out: -  Intake/Output this shift:    Physical exam patient is alert and oriented x3. She is moving all 4 extremities well. Her speech is normal. She is a bit mentally slow but I don't know what her baseline is.  Lab Results:  Recent Labs  06/17/14 2304 06/17/14 2316  WBC 7.2  --   HGB 15.0 16.3*  HCT 43.5 48.0*  PLT 224  --    BMET  Recent Labs  06/17/14 2316  NA 135*  K 4.1  CL 99  GLUCOSE 95  BUN 8  CREATININE 0.80    Studies/Results: Dg Chest 2 View  06/18/2014   CLINICAL DATA:  Multiple falls in the past 2 weeks, with pain in the buttocks. Current history of smoking.  EXAM: CHEST  2 VIEW  COMPARISON:  Chest radiograph performed 08/18/2008, and CT of the chest performed 11/08/2008  FINDINGS: There is more prominent focal soft tissue density at the prior site of postradiation change, in the left suprahilar region. Though this may simply be chronic in nature, recurrent mass is a concern. The right lung appears clear. No pleural effusion or pneumothorax is seen.  The cardiomediastinal silhouette is normal in size. No acute osseous abnormalities are identified.  IMPRESSION: More prominent focal soft tissue density at the prior site of postradiation change at the left suprahilar region.  Though this may simply be chronic in nature, recurrent mass is a concern. PET/CT could be considered further evaluation, as deemed clinically appropriate.   Electronically Signed   By: Garald Balding M.D.   On: 06/18/2014 00:07   Ct Head Wo Contrast  06/18/2014   CLINICAL DATA:  Slurred speech. Increased falling for 2 weeks. Initial valuation.  EXAM: CT HEAD WITHOUT CONTRAST  TECHNIQUE: Contiguous axial images were obtained from the base of the skull through the vertex without intravenous contrast.  COMPARISON:  08/18/2008.  FINDINGS: Left frontal petechial hemorrhages are noted. Diffuse left frontal white matter lucency noted, this is new. This could represent edema. Underlying tumor and/or ischemia could present in this fashion. Diffuse mild cerebral and cerebellar atrophy is present. No mass effect or midline shift. Tiny punctate calcified density noted in the left frontal region. No acute bony abnormality. Metallic density noted right facial region.  IMPRESSION: 1. Punctate petechial type hemorrhage noted within the left frontal lobe.  2. Left frontal parietal diffuse white matter edema. Underlying tumor or ischemia cannot be excluded. No significant mass effect.  These results were called by telephone at the time of interpretation on 06/18/2014 at 12:30 am to Dr. Maggie Schwalbe , who verbally acknowledged these results.   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  EXAM: CT CHEST, ABDOMEN, AND PELVIS WITH CONTRAST  TECHNIQUE: Multidetector CT imaging of the chest, abdomen and pelvis was performed following the standard protocol during bolus administration of intravenous contrast.  CONTRAST:  136mL OMNIPAQUE IOHEXOL 300 MG/ML  SOLN  COMPARISON:  Chest CT on 11/08/2008 and PET-CT on 06/29/2008  FINDINGS: CT CHEST FINDINGS  Mediastinum/Hilar Regions: Mild  mediastinal soft tissue density in the AP window remains stable measuring 13 x 15 mm on image 17 compared to 13 x 17 mm previously. No new or increased lymphadenopathy identified.  Other Thoracic Lymphadenopathy:  None.  Lungs: Left upper lobe pleural-parenchymal scarring and volume loss again demonstrated, consistent post treatment changes. Emphysema again noted. A sub-solid nodule measuring 10 mm is seen in the right upper lobe on image 17 which was not seen on previous study. This is nonspecific and could be due to infectious, inflammatory, or neoplastic etiologies. No evidence of central endobronchial obstruction.  Pleura:  No evidence of effusion or mass.  Vascular/Cardiac:  No acute findings identified.  Musculoskeletal:  No suspicious bone lesions identified.  Other:  None.  CT ABDOMEN AND PELVIS FINDINGS  Hepatobiliary: No masses or other significant abnormality identified.  Pancreas: No mass, inflammatory changes, or other parenchymal abnormality identified.  Spleen:  Within normal limits in size and appearance.  Adrenal Glands:  No mass identified.  Kidneys/Urinary Tract: No masses identified. Tiny left renal cyst noted. No evidence of hydronephrosis.  Stomach/Bowel/Peritoneum: No evidence of wall thickening, mass, or obstruction.  Vascular/Lymphatic: No pathologically enlarged lymph nodes identified. No other significant abnormality identified.  Reproductive:  No mass or other significant abnormality identified.  Other:  None.  Musculoskeletal:  No suspicious bone lesions identified.  IMPRESSION: Post treatment changes in left hemithorax. No definite evidence of recurrent or metastatic carcinoma within the chest, abdomen, or pelvis.  New 10 mm ground-glass opacity in the right upper lobe, which is nonspecific. Differential diagnosis includes infectious inflammatory etiologies, as well as low-grade adenocarcinoma. Initial follow-up by chest CT without contrast is recommended in 3 months to confirm  persistence. This recommendation follows the consensus statement: Recommendations for the Management of Subsolid Pulmonary Nodules Detected at CT: A Statement from the [place] as published in Radiology 2013; 266:304-317.   Electronically Signed   By: Earle Gell M.D.   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  EXAM: CT CHEST, ABDOMEN, AND PELVIS WITH CONTRAST  TECHNIQUE: Multidetector CT imaging of the chest, abdomen and pelvis was performed following the standard protocol during bolus administration of intravenous contrast.  CONTRAST:  147mL OMNIPAQUE IOHEXOL 300 MG/ML  SOLN  COMPARISON:  Chest CT on 11/08/2008 and PET-CT on 06/29/2008  FINDINGS: CT CHEST FINDINGS  Mediastinum/Hilar Regions: Mild mediastinal soft tissue density in the AP window remains stable measuring 13 x 15 mm on image 17 compared to 13 x 17 mm previously. No new or increased lymphadenopathy identified.  Other Thoracic Lymphadenopathy:  None.  Lungs: Left upper lobe pleural-parenchymal scarring and volume loss again demonstrated, consistent post treatment changes. Emphysema again noted. A sub-solid nodule measuring 10 mm is seen in the right upper lobe on image 17 which was not seen on previous study. This is nonspecific and could be due to infectious, inflammatory, or neoplastic etiologies. No evidence of central endobronchial obstruction.  Pleura:  No evidence of effusion or mass.  Vascular/Cardiac:  No acute findings identified.  Musculoskeletal:  No suspicious bone lesions identified.  Other:  None.  CT ABDOMEN AND PELVIS FINDINGS  Hepatobiliary: No masses or other significant abnormality identified.  Pancreas: No mass, inflammatory changes, or other parenchymal abnormality identified.  Spleen:  Within normal limits in size and appearance.  Adrenal Glands:  No mass identified.  Kidneys/Urinary Tract: No masses identified.  Tiny left renal cyst noted. No evidence of hydronephrosis.  Stomach/Bowel/Peritoneum: No evidence of wall thickening, mass, or obstruction.  Vascular/Lymphatic: No pathologically enlarged lymph nodes identified. No other significant abnormality identified.  Reproductive:  No mass or other significant abnormality identified.  Other:  None.  Musculoskeletal:  No suspicious bone lesions identified.  IMPRESSION: Post treatment changes in left hemithorax. No definite evidence of recurrent or metastatic carcinoma within the chest, abdomen, or pelvis.  New 10 mm ground-glass opacity in the right upper lobe, which is nonspecific. Differential diagnosis includes infectious inflammatory etiologies, as well as low-grade adenocarcinoma. Initial follow-up by chest CT without contrast is recommended in 3 months to confirm persistence. This recommendation follows the consensus statement: Recommendations for the Management of Subsolid Pulmonary Nodules Detected at CT: A Statement from the [place] as published in Radiology 2013; 266:304-317.   Electronically Signed   By: Earle Gell M.D.   On: 06/18/2014 13:21    Assessment/Plan: Left frontal tumor: The patient has a history of small cell lung cancer treated in 2009 by Dr. Marin Olp and the radiation doctors.   I have discussed the situation with the patient and her brother. I will asked the oncologist and radiation oncologist to see the patient tomorrow. If they are confident that this is a recurrence of her small cell lung cancer and she has not had the maximal amount of radiation therapy to her brain, then we can treat this with radiation. If we are unsure of the diagnosis or she has had the maximal amount of radiation already, then she will the craniotomy. I've answered all the patient's, and her brothers, questions. I will discharge her to the floor as she is doing well.  LOS: 2 days     Feras Gardella D 06/19/2014, 8:51 AM

## 2014-06-20 ENCOUNTER — Ambulatory Visit
Admit: 2014-06-20 | Discharge: 2014-06-20 | Disposition: A | Discharge status: Home / Self Care | Payer: Medicaid Other | Attending: Radiation Oncology | Admitting: Radiation Oncology

## 2014-06-20 ENCOUNTER — Encounter: Payer: Self-pay | Admitting: Radiation Oncology

## 2014-06-20 DIAGNOSIS — D496 Neoplasm of unspecified behavior of brain: Secondary | ICD-10-CM

## 2014-06-20 LAB — GLUCOSE, CAPILLARY
GLUCOSE-CAPILLARY: 136 mg/dL — AB (ref 70–99)
GLUCOSE-CAPILLARY: 200 mg/dL — AB (ref 70–99)
Glucose-Capillary: 121 mg/dL — ABNORMAL HIGH (ref 70–99)
Glucose-Capillary: 126 mg/dL — ABNORMAL HIGH (ref 70–99)

## 2014-06-20 NOTE — Progress Notes (Signed)
Patient ID: [name], female   DOB: September 02, 1951, 63 y.o.   MRN: [id number] Subjective:  The patient is alert and pleasant and in no apparent distress. She has no complaints.  Objective: Vital signs in last 24 hours: Temp:  [97.6 F (36.4 C)-98.4 F (36.9 C)] 97.7 F (36.5 C) (10/12 1340) Pulse Rate:  [50-73] 63 (10/12 1340) Resp:  [14-20] 16 (10/12 1340) BP: (105-132)/(49-78) 124/61 mmHg (10/12 1340) SpO2:  [92 %-100 %] 100 % (10/12 1340)  Intake/Output from previous day: 10/11 0701 - 10/12 0700 In: 120 [P.O.:120] Out: -  Intake/Output this shift: Total I/O In: 240 [P.O.:240] Out: -   Physical exam the patient is alert and oriented. She is moving all 4 extremities. Her speech is normal.  Lab Results:  Recent Labs  06/17/14 2304 06/17/14 2316  WBC 7.2  --   HGB 15.0 16.3*  HCT 43.5 48.0*  PLT 224  --    BMET  Recent Labs  06/17/14 2316  NA 135*  K 4.1  CL 99  GLUCOSE 95  BUN 8  CREATININE 0.80    Studies/Results: No results found.  Assessment/Plan: Left brain tumor: I discussed the situation again with the patient. I will asked Dr. Marin Olp and the radiation oncologist to see the patient.  LOS: 3 days     Arch Methot D 06/20/2014, 4:53 PM

## 2014-06-20 NOTE — Evaluation (Signed)
Occupational Therapy Evaluation Patient Details Name: [name] MRN: [id number] DOB: [birth year] Today's Date: 06/20/2014    History of Present Illness The patient is a 63 year old white female who by report has been losing weight, falling, and had a decreased mental status. The patient was brought to the [facility] long emergency department where she was evaluated by Dr. Nicholes Stairs. The evaluation included a head CT which demonstrated a left frontal lesion. A neurosurgical consultation was requested. The patient was transferred to [facility] for further neurosurgical care. Pt  presents with left frontal lobe tumor.    Clinical Impression   Pt admitted with above. Unsure of pt's PLOF. Recommending SNF unless family can provide adequate supervision/assistance at home.     Follow Up Recommendations  SNF;Supervision/Assistance - 24 hour    Equipment Recommendations  3 in 1 bedside comode    Recommendations for Other Services Speech consult     Precautions / Restrictions Precautions Precautions: Fall Restrictions Weight Bearing Restrictions: No      Mobility Bed Mobility Overal bed mobility: Needs Assistance Bed Mobility: Supine to Sit;Sit to Supine     Supine to sit: Supervision Sit to supine: Min assist   General bed mobility comments: assist with LE when returning to bed. Assist to scoot HOB.  Transfers Overall transfer level: Needs assistance Equipment used: Rolling walker (2 wheeled) Transfers: Sit to/from Stand Sit to Stand: Min guard;Min assist         General transfer comment: cues for forward weight shift and hand placement.         ADL Overall ADL's : Needs assistance/impaired     Grooming: Brushing hair;Wash/dry face;Minimal assistance;Standing   Upper Body Bathing: Standing;Maximal assistance (decreased balance and assist washing left armpit)           Lower Body Dressing: Maximal assistance;Sit to/from stand   Toilet Transfer: Moderate  assistance;Ambulation;BSC (bed)           Functional mobility during ADLs: Moderate assistance General ADL Comments: Pt with decreased balance at sink while performing ADLs requiring Min-Mod A. Pt with decreased sitting balance sitting EOB trying to donn socks. Pt brushing neck when told to brush her hair. Pt with apparent cognitive deficits.      Vision                     Perception     Praxis      Pertinent Vitals/Pain Pain Assessment: No/denies pain     Hand Dominance     Extremity/Trunk Assessment Upper Extremity Assessment Upper Extremity Assessment: RUE deficits/detail RUE Deficits / Details: approximately 150 degrees AROM shoulder flexion   Lower Extremity Assessment Lower Extremity Assessment: Defer to PT evaluation       Communication Communication Communication: Expressive difficulties   Cognition Arousal/Alertness: Awake/alert Behavior During Therapy: WFL for tasks assessed/performed Overall Cognitive Status: No family/caregiver present to determine baseline cognitive functioning Area of Impairment: Following commands;Problem solving       Following Commands: Follows one step commands inconsistently     Problem Solving: Slow processing     General Comments       Exercises       Shoulder Instructions      Home Living Family/patient expects to be discharged to:: Private residence Living Arrangements: Other relatives Available Help at Discharge: Family Type of Home: House Home Access: Stairs to enter [employer] of Steps: unsure   Home Layout: One level     Bathroom Shower/Tub: Gaffer  Home Equipment: [place] - 2 wheels;Wheelchair - manual          Prior Functioning/Environment          Comments: unsure, pt having expressive difficulties and decreased cogntion    OT Diagnosis: Generalized weakness;Cognitive deficits   OT Problem List: Decreased strength;Decreased activity  tolerance;Impaired balance (sitting and/or standing);Decreased knowledge of use of DME or AE;Decreased knowledge of precautions;Impaired vision/perception;Decreased cognition   OT Treatment/Interventions: Self-care/ADL training;Therapeutic exercise;DME and/or AE instruction;Cognitive remediation/compensation;Therapeutic activities;Visual/perceptual remediation/compensation;Patient/family education;Balance training    OT Goals(Current goals can be found in the care plan section) Acute Rehab OT Goals Patient Stated Goal: not stated OT Goal Formulation: Patient unable to participate in goal setting Time For Goal Achievement: 06/27/14 Potential to Achieve Goals: Good ADL Goals Pt Will Perform Grooming: standing;with supervision Pt Will Perform Lower Body Dressing: with set-up;with supervision;sit to/from stand Pt Will Transfer to Toilet: with supervision;ambulating;bedside commode Pt Will Perform Toileting - Clothing Manipulation and hygiene: with supervision;sit to/from stand  OT Frequency: Min 2X/week   Barriers to D/C:            Co-evaluation              End of Session Equipment Utilized During Treatment: Gait belt  Activity Tolerance: Patient tolerated treatment well Patient left: in bed;with call bell/phone within reach;with bed alarm set   Time: 0943-1006 OT Time Calculation (min): 23 min Charges:  OT General Charges $OT Visit: 1 Procedure OT Evaluation $Initial OT Evaluation Tier I: 1 Procedure OT Treatments $Self Care/Home Management : 8-22 mins G-CodesBenito Mccreedy OTR/L [phone] 06/20/2014, 12:07 PM

## 2014-06-20 NOTE — Evaluation (Signed)
Physical Therapy Evaluation Patient Details Name: [name] MRN: [id number] DOB: May 16, 1951 Today's Date: 06/20/2014   History of Present Illness  The patient is a 63 year old white female who by report has been losing weight, falling, and had a decreased mental status. The patient was brought to the [facility] long emergency department where she was evaluated by Dr. Nicholes Stairs. The evaluation included a head CT which demonstrated a left frontal lesion. A neurosurgical consultation was requested. The patient was transferred to [facility] for further neurosurgical care. Pt  presents with left frontal lobe tumor.   Clinical Impression  Pt presents with dependencies in mobility affecting her independence. Pt was oriented x2 and aware of current situation. Pt did present with some expressive difficulty and was inconsistent with following 1 step commands. Pt was able to ambulate in the room with +1 assist. Pt reported she lives with her brother and his child. Pt will need 24 hour assistance for return home. Pt reported her family could provide support. Pt does have some cognitive impairments, but unclear of her baseline. Recommend continued acute PT for improved independence with mobility. Recommend d/c home with family proving 24 hour assistance or SNF placement if family unable to provide Supervision.     Follow Up Recommendations Home health PT;SNF    Equipment Recommendations  Other (comment) (confirm with family pt has a rw )    Recommendations for Other Services OT consult;Speech consult     Precautions / Restrictions Precautions Precautions: Fall Restrictions Weight Bearing Restrictions: No      Mobility  Bed Mobility Overal bed mobility: Needs Assistance Bed Mobility: Supine to Sit     Supine to sit: Min guard     General bed mobility comments: hob elevated and use of bed rails. Verbal cues for technique  Transfers Overall transfer level: Needs assistance Equipment  used: Rolling walker (2 wheeled) Transfers: Sit to/from Stand Sit to Stand: Min assist         General transfer comment: cues for forward weight shift and hand placement.  Ambulation/Gait Ambulation/Gait assistance: Min assist Ambulation Distance (Feet): 20 Feet Assistive device: Rolling walker (2 wheeled) Gait Pattern/deviations: Step-to pattern;Decreased step length - right;Decreased stance time - left;Decreased weight shift to left;Trunk flexed   Gait velocity interpretation: Below normal speed for age/gender General Gait Details: Pt was able to improve step length with facilatation to weight shift to left. Pt fatigued quickly with gait.  Stairs            Wheelchair Mobility    Modified Rankin (Stroke Patients Only)       Balance Overall balance assessment: Needs assistance Sitting-balance support: Bilateral upper extremity supported;Feet supported Sitting balance-Leahy Scale: Fair     Standing balance support: Bilateral upper extremity supported Standing balance-Leahy Scale: Poor                               Pertinent Vitals/Pain Pain Assessment: No/denies pain    Home Living Family/patient expects to be discharged to:: Private residence Living Arrangements: Other relatives Available Help at Discharge: Family Type of Home: House Home Access: Stairs to enter   Technical brewer of Steps: unsure Home Layout: One level Home Equipment: Environmental consultant - 2 wheels;Wheelchair - manual      Prior Function           Comments: unsure, pt having expressive difficulties and decreased cogntion     Hand Dominance  Extremity/Trunk Assessment   Upper Extremity Assessment: Defer to OT evaluation           Lower Extremity Assessment: Generalized weakness         Communication   Communication: Expressive difficulties  Cognition Arousal/Alertness: Awake/alert Behavior During Therapy: WFL for tasks assessed/performed Overall  Cognitive Status: No family/caregiver present to determine baseline cognitive functioning                      General Comments      Exercises        Assessment/Plan    PT Assessment Patient needs continued PT services  PT Diagnosis Difficulty walking;Generalized weakness   PT Problem List Decreased strength;Decreased activity tolerance;Decreased balance;Decreased mobility;Decreased knowledge of use of DME;Decreased safety awareness  PT Treatment Interventions DME instruction;Gait training;Stair training;Therapeutic activities;Therapeutic exercise;Balance training;Patient/family education   PT Goals (Current goals can be found in the Care Plan section) Acute Rehab PT Goals Patient Stated Goal: To walk PT Goal Formulation: With patient Time For Goal Achievement: 07/04/14 Potential to Achieve Goals: Good    Frequency Min 3X/week   Barriers to discharge        Co-evaluation               End of Session Equipment Utilized During Treatment: Gait belt Activity Tolerance: Patient limited by fatigue Patient left: in bed;with call bell/phone within reach;with bed alarm set Nurse Communication: Mobility status         Time: 5537-4827 PT Time Calculation (min): 25 min   Charges:   PT Evaluation $Initial PT Evaluation Tier I: 1 Procedure PT Treatments $Gait Training: 8-22 mins   PT G Codes:          Lelon Mast 06/20/2014, 9:33 AM

## 2014-06-20 NOTE — Clinical Documentation Improvement (Signed)
Presents with new frontal tumor; possible metastasis from small cell lung cancer.   CT of head reveals: "Left frontal petechial hemorrhages are noted. Diffuse left frontal white matter lucency noted, this is new. This could represent edema".   Patient being treated with IV Decadron 10mg  Q6H  CDI and Coders can't code from Radiology Reports; it needs to be documented.  Please clarify if you feel cerebral edema has ruled in or ruled out and document findings in next progress note and discharge summary if applicable.  Thank You, Zoila Shutter ,RN Clinical Documentation Specialist:  [place] Information [employer]

## 2014-06-20 NOTE — Progress Notes (Signed)
UR complete.  Zarahi Fuerst RN, MSN 

## 2014-06-20 NOTE — Progress Notes (Signed)
Radiation Oncology         [phone] ________________________________   Inpatient Re-Evaluation Note  Name: [name] MRN: [id number]  Date: 06/20/2014  DOB: 1951/06/13  CC:No primary provider on file.  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 female who is seen out of the courtesy of Dr. Newman Pies for an opinion concerning radiation therapy for what appears to be tumor presenting in the left frontal brain. The patient has a prior history of limited stage small cell lung cancer. Patient was diagnosed in April 2008. She received chemotherapy,  chest consolidation radiation therapy and prophylactic cranial irradiation. The patient did well until recently when she presented with weight loss frequent falling and decreased mental status. A CT scan of the head revealed a left frontal lesion with some areas of hemorrhage, but no discrete mass.  The patient was seen in consultation by neurosurgery,  given the patient's prior history of small cell lung cancer, it was felt that surgical intervention may not be the optimal choice for treatment. Radiation therapy is been consulted for further evaluation and consideration for additional treatment.  PREVIOUS RADIATION THERAPY: the patient received 59.4 gray directed at the left central chest, treatments extending from 03/05/2007 through 04/21/2007.   the patient also receive prophylactic cranial irradiation totally 32.4 gray in 18 fractions directed at the whole brain. Patient was treated from 07/15/2007 through 08/10/2007.  PAST MEDICAL HISTORY:  has a past medical history of Bipolar disorder and Cancer.    PAST SURGICAL HISTORY:History reviewed. No pertinent past surgical history.  FAMILY HISTORY: family history is not on file.  SOCIAL  HISTORY:  reports that she has been smoking.  She does not have any smokeless tobacco history on file. She reports that she does not drink alcohol or use illicit drugs.  ALLERGIES: Review of patient's allergies indicates no known allergies.  MEDICATIONS:  No current facility-administered medications for this encounter.   No current outpatient prescriptions on file.   Facility-Administered Medications Ordered in Other Encounters  Medication Dose Route Frequency Provider Last Rate Last Dose  . acetaminophen (TYLENOL) tablet 650 mg  650 mg Oral Q6H PRN Newman Pies, MD       Or  . acetaminophen (TYLENOL) suppository 650 mg  650 mg Rectal Q6H PRN Newman Pies, MD      . dexamethasone (DECADRON) injection 10 mg  10 mg Intravenous 4 times per day Newman Pies, MD   10 mg at 06/21/14 1747  . docusate sodium (COLACE) capsule 100 mg  100 mg Oral BID Newman Pies, MD   100 mg at 06/21/14 1220  . HYDROcodone-acetaminophen (NORCO/VICODIN) 5-325 MG per tablet 1-2 tablet  1-2 tablet Oral Q4H PRN Newman Pies, MD   2 tablet at 06/20/14 2102  . insulin aspart (novoLOG) injection 0-15 Units  0-15 Units Subcutaneous TID WC Erline Levine, MD   3 Units at 06/21/14 1338  . levETIRAcetam (KEPPRA) tablet 500 mg  500 mg Oral BID Newman Pies, MD   500 mg at 06/21/14 1220  . metFORMIN (GLUCOPHAGE) tablet 1,000 mg  1,000 mg Oral BID WC Newman Pies, MD   1,000 mg at 06/21/14 1746  . ondansetron (ZOFRAN) tablet 4 mg  4 mg Oral Q6H PRN Newman Pies, MD       Or  .  ondansetron (ZOFRAN) injection 4 mg  4 mg Intravenous Q6H PRN Newman Pies, MD      . pantoprazole (PROTONIX) EC tablet 40 mg  40 mg Oral Daily Newman Pies, MD   40 mg at 06/21/14 1220  . risperiDONE (RISPERDAL) tablet 4 mg  4 mg Oral QHS Newman Pies, MD   4 mg at 06/20/14 2100        PHYSICAL EXAM: pt not examined  LABORATORY DATA:  Lab Results  Component Value Date   WBC 7.2 06/17/2014   HGB 16.3* 06/17/2014   HCT  48.0* 06/17/2014   MCV 85.5 06/17/2014   PLT 224 06/17/2014   NEUTROABS 4.5 06/17/2014   Lab Results  Component Value Date   NA 135* 06/17/2014   K 4.1 06/17/2014   CL 99 06/17/2014   CO2 22 04/12/2010   GLUCOSE 95 06/17/2014   CREATININE 0.80 06/17/2014   CALCIUM 9.1 04/12/2010      RADIOGRAPHY: Dg Chest 2 View  06/18/2014   CLINICAL DATA:  Multiple falls in the past 2 weeks, with pain in the buttocks. Current history of smoking.  EXAM: CHEST  2 VIEW  COMPARISON:  Chest radiograph performed 08/18/2008, and CT of the chest performed 11/08/2008  FINDINGS: There is more prominent focal soft tissue density at the prior site of postradiation change, in the left suprahilar region. Though this may simply be chronic in nature, recurrent mass is a concern. The right lung appears clear. No pleural effusion or pneumothorax is seen.  The cardiomediastinal silhouette is normal in size. No acute osseous abnormalities are identified.  IMPRESSION: More prominent focal soft tissue density at the prior site of postradiation change at the left suprahilar region. Though this may simply be chronic in nature, recurrent mass is a concern. PET/CT could be considered further evaluation, as deemed clinically appropriate.   Electronically Signed   By: Garald Balding M.D.   On: 06/18/2014 00:07   Ct Head Wo Contrast  06/18/2014   CLINICAL DATA:  Slurred speech. Increased falling for 2 weeks. Initial valuation.  EXAM: CT HEAD WITHOUT CONTRAST  TECHNIQUE: Contiguous axial images were obtained from the base of the skull through the vertex without intravenous contrast.  COMPARISON:  08/18/2008.  FINDINGS: Left frontal petechial hemorrhages are noted. Diffuse left frontal white matter lucency noted, this is new. This could represent edema. Underlying tumor and/or ischemia could present in this fashion. Diffuse mild cerebral and cerebellar atrophy is present. No mass effect or midline shift. Tiny punctate calcified density noted in the  left frontal region. No acute bony abnormality. Metallic density noted right facial region.  IMPRESSION: 1. Punctate petechial type hemorrhage noted within the left frontal lobe.  2. Left frontal parietal diffuse white matter edema. Underlying tumor or ischemia cannot be excluded. No significant mass effect.  These results were called by telephone at the time of interpretation on 06/18/2014 at 12:30 am to Dr. Maggie Schwalbe , who verbally acknowledged these results.   Electronically Signed   By: Marcello Moores  Register   On: 06/18/2014 00:33   Ct Chest W Contrast/Ct Abdomen Pelvis W Contrast  06/18/2014   CLINICAL DATA:  Followup small cell carcinoma of left lung. New brain metastasis.  EXAM: CT CHEST, ABDOMEN, AND PELVIS WITH CONTRAST  TECHNIQUE: Multidetector CT imaging of the chest, abdomen and pelvis was performed following the standard protocol during bolus administration of intravenous contrast.  CONTRAST:  199mL OMNIPAQUE IOHEXOL 300 MG/ML  SOLN  COMPARISON:  Chest CT on 11/08/2008  and PET-CT on 06/29/2008  FINDINGS: CT CHEST FINDINGS  Mediastinum/Hilar Regions: Mild mediastinal soft tissue density in the AP window remains stable measuring 13 x 15 mm on image 17 compared to 13 x 17 mm previously. No new or increased lymphadenopathy identified.  Other Thoracic Lymphadenopathy:  None.  Lungs: Left upper lobe pleural-parenchymal scarring and volume loss again demonstrated, consistent post treatment changes. Emphysema again noted. A sub-solid nodule measuring 10 mm is seen in the right upper lobe on image 17 which was not seen on previous study. This is nonspecific and could be due to infectious, inflammatory, or neoplastic etiologies. No evidence of central endobronchial obstruction.  Pleura:  No evidence of effusion or mass.  Vascular/Cardiac:  No acute findings identified.  Musculoskeletal:  No suspicious bone lesions identified.  Other:  None.  CT ABDOMEN AND PELVIS FINDINGS  Hepatobiliary: No masses or  other significant abnormality identified.  Pancreas: No mass, inflammatory changes, or other parenchymal abnormality identified.  Spleen:  Within normal limits in size and appearance.  Adrenal Glands:  No mass identified.  Kidneys/Urinary Tract: No masses identified. Tiny left renal cyst noted. No evidence of hydronephrosis.  Stomach/Bowel/Peritoneum: No evidence of wall thickening, mass, or obstruction.  Vascular/Lymphatic: No pathologically enlarged lymph nodes identified. No other significant abnormality identified.  Reproductive:  No mass or other significant abnormality identified.  Other:  None.  Musculoskeletal:  No suspicious bone lesions identified.  IMPRESSION: Post treatment changes in left hemithorax. No definite evidence of recurrent or metastatic carcinoma within the chest, abdomen, or pelvis.  New 10 mm ground-glass opacity in the right upper lobe, which is nonspecific. Differential diagnosis includes infectious inflammatory etiologies, as well as low-grade adenocarcinoma. Initial follow-up by chest CT without contrast is recommended in 3 months to confirm persistence. This recommendation follows the consensus statement: Recommendations for the Management of Subsolid Pulmonary Nodules Detected at CT: A Statement from the [employer] as published in Radiology 2013; 266:304-317.   Electronically Signed   By: Earle Gell M.D.   On: 06/18/2014 13:21   MRI Brain 06/22/14  CLINICAL DATA: History of left lung small cell lung cancer. Recent  weight loss, falls, and decreased mental status. Left frontal lesion  on head CT concerning for metastasis.  EXAM:  MRI HEAD WITHOUT AND WITH CONTRAST  TECHNIQUE:  Multiplanar, multiecho pulse sequences of the brain and surrounding  structures were obtained without and with intravenous contrast.  CONTRAST: 88mL MULTIHANCE GADOBENATE DIMEGLUMINE 529 MG/ML IV SOLN  COMPARISON: Head CT 06/17/2014  FINDINGS:  Stealth protocol was performed for surgical  planning.  Examination is limited by extensive magnetic susceptibility artifact  related to metallic foreign body in the soft tissues below the right  central skull base deep to the parotid gland. Postcontrast images  are also severely degraded by motion artifact.  Diffusion imaging was not performed, limiting evaluation for  infarct. There is extensive vasogenic edema throughout the left  frontal and left parietal lobes. Heterogeneous, predominantly  peripherally enhancing mass in the high paramedian left frontal lobe  demonstrates areas of cystic change/necrosis and measures  approximately 3.5 x 2.6 x 2.3 cm with associated susceptibility  artifact which may reflect blood products and/or calcification.  Enhancing mass in the posterior medial left parietal lobe measures  1.7 x 1.6 x 1.4 cm, also with evidence of hemorrhage and/or  calcification.  There is no midline shift. No gross extra-axial fluid collection is  identified. There is mild cerebral atrophy.  IMPRESSION:  Severely limited examination due  to susceptibility artifact and  motion. Enhancing masses in the left frontal and left parietal  lobes, concerning for metastases.       IMPRESSION: Limited stage small cell lung cancer with history of remission for several years. Patient now has a lesion in the left frontal brain with diffuse white matter edema on Head Ct. This could be secondary to tumor or ischemia. Recent CT scans of the chest abdomen and pelvis show no obvious systemic recurrence. MRI today shows extensive vasogenic edema in the left frontal and parietal lobes. Patient also appears to have 2 separate lesions in the left frontal and left parietal area consistent with metastasis.  PLAN:  The patient's MRI although suboptimal is consistent with metastatic disease.  It would be quite unusual however to have a recurrence of small cell lung cancer this far out from initial diagnosis and treatment (7 years). The patient may  require a biopsy or possibly surgery to further characterize these lesions and their management. Will await further input from neurosurgery and medical oncology.   ------------------------------------------------  Blair Promise, PhD, MD

## 2014-06-21 ENCOUNTER — Inpatient Hospital Stay (HOSPITAL_COMMUNITY): Payer: Medicaid Other

## 2014-06-21 ENCOUNTER — Other Ambulatory Visit (HOSPITAL_COMMUNITY): Payer: Self-pay | Admitting: Neurosurgery

## 2014-06-21 DIAGNOSIS — Z720 Tobacco use: Secondary | ICD-10-CM

## 2014-06-21 DIAGNOSIS — Z85118 Personal history of other malignant neoplasm of bronchus and lung: Secondary | ICD-10-CM

## 2014-06-21 DIAGNOSIS — F329 Major depressive disorder, single episode, unspecified: Secondary | ICD-10-CM

## 2014-06-21 DIAGNOSIS — I619 Nontraumatic intracerebral hemorrhage, unspecified: Secondary | ICD-10-CM

## 2014-06-21 DIAGNOSIS — Z9889 Other specified postprocedural states: Secondary | ICD-10-CM

## 2014-06-21 DIAGNOSIS — R4781 Slurred speech: Secondary | ICD-10-CM

## 2014-06-21 LAB — GLUCOSE, CAPILLARY
GLUCOSE-CAPILLARY: 109 mg/dL — AB (ref 70–99)
GLUCOSE-CAPILLARY: 165 mg/dL — AB (ref 70–99)
GLUCOSE-CAPILLARY: 104 mg/dL — AB (ref 70–99)
GLUCOSE-CAPILLARY: 124 mg/dL — AB (ref 70–99)

## 2014-06-21 MED ORDER — GADOBENATE DIMEGLUMINE 529 MG/ML IV SOLN
15.0000 mL | Freq: Once | INTRAVENOUS | Status: AC | PRN
  Administered 2014-06-21: 12 mL via INTRAVENOUS

## 2014-06-21 NOTE — Progress Notes (Signed)
Patient ID: [name], female   DOB: Mar 12, 1951, 63 y.o.   MRN: [id number] Subjective:  The patient is alert and pleasant. She is mildly confused. I don't know what her baseline is.  Objective: Vital signs in last 24 hours: Temp:  [97.2 F (36.2 C)-97.9 F (36.6 C)] 97.6 F (36.4 C) (10/13 0700) Pulse Rate:  [56-70] 61 (10/13 0700) Resp:  [16-20] 18 (10/13 0700) BP: (107-136)/(61-81) 134/69 mmHg (10/13 0700) SpO2:  [99 %-100 %] 99 % (10/13 0700)  Intake/Output from previous day: 10/12 0701 - 10/13 0700 In: 240 [P.O.:240] Out: -  Intake/Output this shift:    Physical exam the patient is alert and oriented x2. She is moving all 4 extremities well.  Lab Results: No results found for this basename: WBC, HGB, HCT, PLT,  in the last 72 hours BMET No results found for this basename: NA, K, CL, CO2, GLUCOSE, BUN, CREATININE, CALCIUM,  in the last 72 hours  Studies/Results: No results found.  Assessment/Plan: Left brain tumor: I appreciate Dr. Dicie Beam help. It looks like she will need surgery. I'll take a look at the schedule and figure out when.  LOS: 4 days     [name] D 06/21/2014, 8:16 AM

## 2014-06-21 NOTE — Consult Note (Cosign Needed)
[name], Marshall NO.:  [id number]  MEDICAL RECORD NO.:  [id number]  LOCATION:  [id number]                        FACILITY:  [facility]  PHYSICIAN:  Volanda Napoleon, M.D.  DATE OF BIRTH:  [birth year]  DATE OF CONSULTATION:  06/21/2014 DATE OF DISCHARGE:                                CONSULTATION   REFERRING PHYSICIAN:  Ophelia Charter, MD.  REASON FOR CONSULTATION: 1. History of limited stage small cell lung cancer. 2. Possible CNS recurrence.  HISTORY OF PRESENT ILLNESS:  The patient is a very nice 63 year old white female.  I have not seen her for quite a while.  [name] has a history of limited stage small cell lung cancer.  [name] presented with a mass in left upper lung.  This was back in May 2008.  [name] had a biopsy, which showed a small cell lung cancer.  [name] had limited stage disease.  [name] was treated with radiation chemotherapy.  [name] got 4 cycles of cisplatin/etoposide.  [name] got radiation therapy with this.  [name] also received prophylactic cranial radiation therapy.  [name] continued to smoke.  [name] had her prophylactic cranial radiation therapy back in December 2008.  [name] has had no evidence of recurrent disease to date.  The last scans that I did on her were back in 2010.  [name] subsequently was admitted initially to [facility].  This was on October 10.  [name] had fall and had decreased mental status.  [name] had a CT of the head, which showed a left frontal lesion.  [name] was seen by Neurosurgery.  The CT of the head showed left frontoparietal white matter edema.  Underlying "tumor or ischemia" could not be excluded.  [name] was transferred over to [facility].  [name] was attempted to have an MRI of the brain done.  Again, this could not be done because of agitation.  [name] does have some history of psychiatric illnesses.  [name] did have a CT of the chest, abdomen, and pelvis.  This was negative for any obvious metastatic disease.  [name] had been seen by  Radiation Oncology.  Her lab work when [name] came in showed a white cell count of 7.2, hemoglobin 15, hematocrit 43.5, platelet count 224.  Her sodium was 135, potassium 4.1, BUN 8, creatinine 0.8.  [name] had no obvious seizures.  We were asked to see her.  [name] really could not give much in the way of history.  [name] does have some dysarthria.  There was no obvious seizures.  [name] has had no cough. There is no hemoptysis.  [name] still is smoking.  PAST MEDICAL HISTORY:  Remarkable for: 1. Bipolar disorder. 2. Noninsulin dependent diabetes.  ALLERGIES:  None.  ADMISSION MEDICATIONS: 1. Metformin 1000 mg p.o. b.i.d. 2. Risperdal 4 mg p.o. at bedtime.  SOCIAL HISTORY:  Remarkable for tobacco use.  [name] probably has greater than 100-pack-year history of tobacco use.  I think at one point, [name] used to smoke 3 packs a day.  There is no alcohol use.  FAMILY HISTORY:  Noncontributory.  PHYSICAL EXAMINATION:  GENERAL:  This is a fairly well-developed and well-nourished white female, in no obvious distress. VITAL  SIGNS:  Temperature of 97.6, pulse 61, blood pressure 134/69. HEAD AND NECK:  Shows no ocular or oral lesions.  There are no palpable cervical or supraclavicular lymph nodes. LUNGS:  Clear. CARDIAC:  Regular rate and rhythm with no murmurs, rubs, or bruits. ABDOMEN:  Soft.  [name] has good bowel sounds.  There is no fluid wave. There is no palpable abdominal mass.  There is no palpable liver or spleen tip. EXTREMITIES:  Show some weakness, maybe over on the right side.  [name] has decent range of motion of the extremities. NEUROLOGICAL:  Shows some dysarthria.  Again, there may be some weakness over on the right side.  IMPRESSION:  The patient is a very nice 63 year old white female. Again, I am not seeing her probably for about 6 years.  [name] had limited stage small cell lung cancer.  This was back in 2008.  [name] had aggressive chemo and radiation therapy concurrently.  [name] then  had prophylactic cranial radiation therapy.  I would find it incredibly hard to believe that [name] would have CNS recurrence without any systemic recurrence.  I think if [name] has a CNS lesion, this could easily be another type of primary. Unfortunately, I think that [name] is going to need to have a biopsy done. I cannot see any other way of trying to figure out what is going on with her.  Again, I would think that it would be incredibly rare to have a recurrence of small-cell lung cancer about 7 years after the treatment. I think this would be particularly the case since [name] already had prophylactic cranial radiation therapy and also has no evidence of systemic recurrence.  Again, I think that [name] is going to need a biopsy.  I think [name] may need to have her MRI done under sedation because of her bipolar disorder.  I cannot see any type of lab work that we need to do to try to sort out what might be going on here.  It was nice to see her again.  I wished that it was under different circumstances. We will follow along.     Volanda Napoleon, M.D.     PRE/MEDQ  D:  06/21/2014  T:  06/21/2014  Job:  [id number]

## 2014-06-21 NOTE — Consult Note (Signed)
#   [id number] is the consult note. I would be incredibly shocked if she had recurrent small cell lung cancer. She had her initial diagnosis back 7 years ago. She had a limited stage disease. She had concurrent chemoradiation therapy. She never had prophylactic cranial irradiation therapy.  Again, I think that the only way that we are going to prove that she has malignancy is with a biopsy. She really cannot do the MRI of the brain. She may need to be under general anesthesia to do the MRI of the brain.  I cannot think of any lab work that we can do to try to help with his depression.  If she has malignancy, this could be a different kind of malignancy. She is still smoking.  She has no evidence of systemic disease by the CT scans.  It was nice to see her again.  We will follow along and try to help out if possible.  Pete E.

## 2014-06-21 NOTE — Progress Notes (Signed)
Physical Therapy Treatment Patient Details Name: [name] MRN: [id number] DOB: [birth year] Today's Date: 06/21/2014    History of Present Illness The patient is a 63 year old white female who by report has been losing weight, falling, and had a decreased mental status. The patient was brought to the [facility] long emergency department where she was evaluated by Dr. Nicholes Stairs. The evaluation included a head CT which demonstrated a left frontal lesion. A neurosurgical consultation was requested. The patient was transferred to [facility] for further neurosurgical care. Pt  presents with left frontal lobe tumor.     PT Comments    *Pt ambulated 28' with RW and min assist for balance. She had difficulty communicating verbally, seemed to have word finding difficulty. 24* assist recommended upon DC. **  Follow Up Recommendations  Home health PT;SNF (needs 24* assist, HHPT if family able to provide this, SNF if not)     Equipment Recommendations  Other (comment) (confirm with family pt has a rw )    Recommendations for Other Services OT consult;Speech consult     Precautions / Restrictions Precautions Precautions: Fall Restrictions Weight Bearing Restrictions: No    Mobility  Bed Mobility Overal bed mobility: Needs Assistance Bed Mobility: Supine to Sit     Supine to sit: Min assist Sit to supine: Min assist   General bed mobility comments: hob elevated and use of bed rails. Verbal cues for technique. Assist to raise trunk for OOB, then for BLEs to get into bed.   Transfers Overall transfer level: Needs assistance Equipment used: Rolling walker (2 wheeled) Transfers: Sit to/from Stand Sit to Stand: Min assist         General transfer comment: cues for forward weight shift and hand placement.  Ambulation/Gait Ambulation/Gait assistance: Min assist Ambulation Distance (Feet): 40 Feet Assistive device: Rolling walker (2 wheeled) Gait Pattern/deviations: Step-to  pattern;Decreased step length - left;Decreased step length - right   Gait velocity interpretation: Below normal speed for age/gender General Gait Details: Pt appeared to fatigue quickly with walking. She had difficulty communicating verbally, but indicated she wanted to return to bed. Min A for balance especially with turns. Manual and verbal cues for safety with RW.    Stairs            Wheelchair Mobility    Modified Rankin (Stroke Patients Only)       Balance     Sitting balance-Leahy Scale: Fair       Standing balance-Leahy Scale: Poor                      Cognition Arousal/Alertness: Awake/alert Behavior During Therapy: WFL for tasks assessed/performed;Flat affect Overall Cognitive Status: No family/caregiver present to determine baseline cognitive functioning Area of Impairment: Following commands;Problem solving       Following Commands: Follows one step commands inconsistently     Problem Solving: Slow processing General Comments: seems to have word finding difficulty    Exercises      General Comments        Pertinent Vitals/Pain Pain Assessment: No/denies pain    Home Living                      Prior Function            PT Goals (current goals can now be found in the care plan section) Acute Rehab PT Goals Patient Stated Goal: To walk PT Goal Formulation: With patient Time For Goal Achievement:  07/04/14 Potential to Achieve Goals: Good Progress towards PT goals: Progressing toward goals    Frequency  Min 3X/week    PT Plan Current plan remains appropriate    Co-evaluation             End of Session Equipment Utilized During Treatment: Gait belt Activity Tolerance: Patient limited by fatigue Patient left: in bed;with call bell/phone within reach;with bed alarm set     Time: 1351-1408 PT Time Calculation (min): 17 min  Charges:  $Gait Training: 8-22 mins                    G Codes:      Philomena Doheny 06/21/2014, 2:19 PM [phone]

## 2014-06-22 ENCOUNTER — Encounter (HOSPITAL_COMMUNITY): Payer: Self-pay | Admitting: Radiology

## 2014-06-22 ENCOUNTER — Inpatient Hospital Stay (HOSPITAL_COMMUNITY): Payer: Medicaid Other

## 2014-06-22 LAB — GLUCOSE, CAPILLARY
Glucose-Capillary: 108 mg/dL — ABNORMAL HIGH (ref 70–99)
Glucose-Capillary: 121 mg/dL — ABNORMAL HIGH (ref 70–99)
Glucose-Capillary: 150 mg/dL — ABNORMAL HIGH (ref 70–99)
Glucose-Capillary: 123 mg/dL — ABNORMAL HIGH (ref 70–99)

## 2014-06-22 LAB — PROTIME-INR
INR: 1.07 (ref 0.00–1.49)
Prothrombin Time: 13.9 seconds (ref 11.6–15.2)

## 2014-06-22 MED ORDER — LACTATED RINGERS IV BOLUS (SEPSIS): 1000.0000 mL | Freq: Once | INTRAVENOUS | Status: DC

## 2014-06-22 MED ORDER — IOHEXOL 300 MG/ML  SOLN
100.0000 mL | Freq: Once | INTRAMUSCULAR | Status: AC | PRN
  Administered 2014-06-22: 100 mL via INTRAVENOUS

## 2014-06-22 NOTE — Progress Notes (Signed)
Occupational Therapy Treatment Patient Details Name: [name] MRN: [id number] DOB: 1951-07-04 Today's Date: 06/22/2014    History of present illness The patient is a 63 year old white female who by report has been losing weight, falling, and had a decreased mental status. The patient was brought to the [facility] long emergency department where she was evaluated by Dr. Nicholes Stairs. The evaluation included a head CT which demonstrated a left frontal lesion. A neurosurgical consultation was requested. The patient was transferred to [facility] for further neurosurgical care. Pt  presents with left frontal lobe tumor.    OT comments  Pt willing to participate in OT session. Continue to recommend SNF for d/c unless family available to provide necessary assistance.   Follow Up Recommendations  SNF;Supervision/Assistance - 24 hour    Equipment Recommendations  3 in 1 bedside comode    Recommendations for Other Services Speech consult    Precautions / Restrictions Precautions Precautions: Fall Restrictions Weight Bearing Restrictions: No       Mobility Bed Mobility Overal bed mobility: Needs Assistance Bed Mobility: Supine to Sit     Supine to sit: Mod assist     General bed mobility comments: assistance with trunk and to scoot to EOB.  Transfers Overall transfer level: Needs assistance Equipment used: Rolling walker (2 wheeled) Transfers: Sit to/from Stand Sit to Stand: Min assist;Mod assist              Balance Overall balance assessment: Needs assistance         Standing balance support: During functional activity;Single extremity supported;No upper extremity supported Standing balance-Leahy Scale: Poor Standing balance comment: performed ADLs at sink and pt requiring Min-Max A for balance. Cues for positioning of feet. Pt leaning to right.                   ADL Overall ADL's : Needs assistance/impaired     Grooming: Wash/dry face;Brushing  hair;Applying deodorant;Maximal assistance;Standing   Upper Body Bathing: Moderate assistance;Maximal assistance;Standing   Lower Body Bathing: Sit to/from stand;Maximal assistance   Upper Body Dressing : Minimal assistance;Sitting (gown)   Lower Body Dressing: Sitting/lateral leans;Moderate assistance (socks)   Toilet Transfer: Moderate assistance;Ambulation;Minimal assistance;RW;BSC           Functional mobility during ADLs: Moderate assistance;Rolling walker General ADL Comments: Pt with decreased balance at sink requiring Min-Max A. Pt attempting to put deodorant in mouth. Pt with slow processing and difficulty following commands. Pt incontinent of urine when OT arrived, so washed up at sink and changed gown.       Vision                     Perception     Praxis      Cognition   Behavior During Therapy: [facility] for tasks assessed/performed;Flat affect Overall Cognitive Status: No family/caregiver present to determine baseline cognitive functioning Area of Impairment: Following commands;Safety/judgement;Problem solving      Following Commands: Follows one step commands inconsistently     Problem Solving: Slow processing      Extremity/Trunk Assessment               Exercises     Shoulder Instructions       General Comments      Pertinent Vitals/ Pain       Pain Assessment: No/denies pain  Home Living  Prior Functioning/Environment              Frequency Min 2X/week     Progress Toward Goals  OT Goals(current goals can now be found in the care plan section)  Progress towards OT goals: Progressing toward goals  Acute Rehab OT Goals Patient Stated Goal: not stated OT Goal Formulation: Patient unable to participate in goal setting Time For Goal Achievement: 06/27/14 Potential to Achieve Goals: Good ADL Goals Pt Will Perform Grooming: standing;with supervision Pt Will  Perform Upper Body Bathing: with supervision;standing;with set-up Pt Will Perform Lower Body Bathing: with supervision;sit to/from stand;with set-up Pt Will Perform Lower Body Dressing: with set-up;with supervision;sit to/from stand Pt Will Transfer to Toilet: with supervision;ambulating;bedside commode Pt Will Perform Toileting - Clothing Manipulation and hygiene: with supervision;sit to/from stand  Plan Discharge plan remains appropriate    Co-evaluation                 End of Session Equipment Utilized During Treatment: Gait belt;Rolling walker   Activity Tolerance Patient tolerated treatment well   Patient Left in chair;with call bell/phone within reach;with chair alarm set   Nurse Communication Other (comment) (cognition; incontinent)        Time: 2025-4270 OT Time Calculation (min): 32 min  Charges: OT General Charges $OT Visit: 1 Procedure OT Treatments $Self Care/Home Management : 23-37 mins  Benito Mccreedy OTR/L [phone] 06/22/2014, 3:39 PM

## 2014-06-22 NOTE — Progress Notes (Signed)
Patient ID: [name], female   DOB: [birth year], 63 y.o.   MRN: [id number] Subjective:  The patient is alert and pleasant. She has no complaints. She asked appropriate questions.  Objective: Vital signs in last 24 hours: Temp:  [97.7 F (36.5 C)-98.2 F (36.8 C)] 97.9 F (36.6 C) (10/14 0632) Pulse Rate:  [52-67] 56 (10/14 0632) Resp:  [16-18] 18 (10/14 0632) BP: (106-154)/(60-90) 116/80 mmHg (10/14 0632) SpO2:  [98 %-100 %] 100 % (10/14 0632)  Intake/Output from previous day: 10/13 0701 - 10/14 0700 In: 360 [P.O.:360] Out: -  Intake/Output this shift:    Physical exam patient is alert and oriented x3. Her pupils are equal. Her strength is normal. Her speech is normal.  Lab Results: No results found for this basename: WBC, HGB, HCT, PLT,  in the last 72 hours BMET No results found for this basename: NA, K, CL, CO2, GLUCOSE, BUN, CREATININE, CALCIUM,  in the last 72 hours  Studies/Results: Mr [name] Wo Contrast  06/21/2014   CLINICAL DATA:  History of left lung small cell lung cancer. Recent weight loss, falls, and decreased mental status. Left frontal lesion on head CT concerning for metastasis.  EXAM: MRI HEAD WITHOUT AND WITH CONTRAST  TECHNIQUE: Multiplanar, multiecho pulse sequences of the brain and surrounding structures were obtained without and with intravenous contrast.  CONTRAST:  12mL MULTIHANCE GADOBENATE DIMEGLUMINE 529 MG/ML IV SOLN  COMPARISON:  Head CT 06/17/2014  FINDINGS: Stealth protocol was performed for surgical planning.  Examination is limited by extensive magnetic susceptibility artifact related to metallic foreign body in the soft tissues below the right central skull base deep to the parotid gland. Postcontrast images are also severely degraded by motion artifact.  Diffusion imaging was not performed, limiting evaluation for infarct. There is extensive vasogenic edema throughout the left frontal and left parietal lobes. Heterogeneous, predominantly  peripherally enhancing mass in the high paramedian left frontal lobe demonstrates areas of cystic change/necrosis and measures approximately 3.5 x 2.6 x 2.3 cm with associated susceptibility artifact which may reflect blood products and/or calcification. Enhancing mass in the posterior medial left parietal lobe measures 1.7 x 1.6 x 1.4 cm, also with evidence of hemorrhage and/or calcification.  There is no midline shift. No gross extra-axial fluid collection is identified. There is mild cerebral atrophy.  IMPRESSION: Severely limited examination due to susceptibility artifact and motion. Enhancing masses in the left frontal and left parietal lobes, concerning for metastases.   Electronically Signed   By: Logan Bores   On: 06/21/2014 12:22    Assessment/Plan: Left brain tumor: I have discussed situation with the patient. I have recommended a left craniotomy for resection of the tumor. I have described the surgery to her. We have discussed the risks of surgery including risks of anesthesia, hemorrhage, infection, stroke, seizures, incomplete tumor resection, recurrent tumor growth, medical risk, etc. I have answered all her questions. She has decided to proceed with surgery. We will plan to do this tomorrow afternoon.  LOS: 5 days     [name] D 06/22/2014, 8:51 AM

## 2014-06-22 NOTE — Progress Notes (Signed)
BP 82/52, MD notified, 1 L LR bolus given.  Pt to CT.

## 2014-06-23 ENCOUNTER — Encounter (HOSPITAL_COMMUNITY): Payer: Self-pay | Admitting: Certified Registered Nurse Anesthetist

## 2014-06-23 ENCOUNTER — Encounter (HOSPITAL_COMMUNITY): Payer: Medicaid Other | Admitting: Certified Registered Nurse Anesthetist

## 2014-06-23 ENCOUNTER — Encounter (HOSPITAL_COMMUNITY)
Admission: EM | Disposition: A | Discharge status: Rehabilitation Facility | Payer: Self-pay | Source: Home / Self Care | Attending: Neurosurgery

## 2014-06-23 ENCOUNTER — Inpatient Hospital Stay (HOSPITAL_COMMUNITY): Payer: Medicaid Other | Admitting: Certified Registered Nurse Anesthetist

## 2014-06-23 HISTORY — PX: CRANIOTOMY: SHX93

## 2014-06-23 LAB — GLUCOSE, CAPILLARY
Glucose-Capillary: 106 mg/dL — ABNORMAL HIGH (ref 70–99)
Glucose-Capillary: 107 mg/dL — ABNORMAL HIGH (ref 70–99)
Glucose-Capillary: 116 mg/dL — ABNORMAL HIGH (ref 70–99)

## 2014-06-23 LAB — TYPE AND SCREEN
ABO/RH(D): B POS
Antibody Screen: NEGATIVE

## 2014-06-23 LAB — ABO/RH: ABO/RH(D): B POS

## 2014-06-23 LAB — SURGICAL PCR SCREEN
MRSA, PCR: NEGATIVE
Staphylococcus aureus: NEGATIVE

## 2014-06-23 SURGERY — CRANIOTOMY TUMOR EXCISION
Anesthesia: General | Laterality: Left

## 2014-06-23 MED ORDER — LIDOCAINE HCL (CARDIAC) 20 MG/ML IV SOLN
INTRAVENOUS | Status: AC
  Filled 2014-06-23: qty 5

## 2014-06-23 MED ORDER — ONDANSETRON HCL 4 MG PO TABS: 4.0000 mg | ORAL_TABLET | ORAL | Status: DC | PRN

## 2014-06-23 MED ORDER — DEXAMETHASONE SODIUM PHOSPHATE 10 MG/ML IJ SOLN
INTRAMUSCULAR | Status: AC
  Filled 2014-06-23: qty 1

## 2014-06-23 MED ORDER — PROMETHAZINE HCL 25 MG PO TABS: 12.5000 mg | ORAL_TABLET | ORAL | Status: DC | PRN

## 2014-06-23 MED ORDER — ACETAMINOPHEN 650 MG RE SUPP: 650.0000 mg | RECTAL | Status: DC | PRN

## 2014-06-23 MED ORDER — FENTANYL CITRATE 0.05 MG/ML IJ SOLN: 25.0000 ug | INTRAMUSCULAR | Status: DC | PRN

## 2014-06-23 MED ORDER — PHENYLEPHRINE HCL 10 MG/ML IJ SOLN
10.0000 mg | INTRAMUSCULAR | Status: DC | PRN
  Administered 2014-06-23: 20 ug/min via INTRAVENOUS

## 2014-06-23 MED ORDER — NEOSTIGMINE METHYLSULFATE 10 MG/10ML IV SOLN
INTRAVENOUS | Status: DC | PRN
  Administered 2014-06-23: 4 mg via INTRAVENOUS

## 2014-06-23 MED ORDER — 0.9 % SODIUM CHLORIDE (POUR BTL) OPTIME
TOPICAL | Status: DC | PRN
  Administered 2014-06-23 (×2): 1000 mL

## 2014-06-23 MED ORDER — ESMOLOL HCL 10 MG/ML IV SOLN
INTRAVENOUS | Status: DC | PRN
  Administered 2014-06-23 (×2): 20 mg via INTRAVENOUS

## 2014-06-23 MED ORDER — FENTANYL CITRATE 0.05 MG/ML IJ SOLN
INTRAMUSCULAR | Status: AC
  Filled 2014-06-23: qty 5

## 2014-06-23 MED ORDER — ACETAMINOPHEN 325 MG PO TABS
650.0000 mg | ORAL_TABLET | ORAL | Status: DC | PRN
  Administered 2014-06-25: 650 mg via ORAL

## 2014-06-23 MED ORDER — PANTOPRAZOLE SODIUM 40 MG IV SOLR
40.0000 mg | Freq: Every day | INTRAVENOUS | Status: DC
  Administered 2014-06-23 – 2014-06-26 (×4): 40 mg via INTRAVENOUS
  Filled 2014-06-23 (×5): qty 40

## 2014-06-23 MED ORDER — LIDOCAINE HCL (CARDIAC) 20 MG/ML IV SOLN
INTRAVENOUS | Status: DC | PRN
  Administered 2014-06-23: 60 mg via INTRAVENOUS

## 2014-06-23 MED ORDER — ARTIFICIAL TEARS OP OINT
TOPICAL_OINTMENT | OPHTHALMIC | Status: AC
  Filled 2014-06-23: qty 3.5

## 2014-06-23 MED ORDER — ONDANSETRON HCL 4 MG/2ML IJ SOLN
INTRAMUSCULAR | Status: AC
  Filled 2014-06-23: qty 2

## 2014-06-23 MED ORDER — ALBUTEROL SULFATE HFA 108 (90 BASE) MCG/ACT IN AERS
INHALATION_SPRAY | RESPIRATORY_TRACT | Status: AC
  Filled 2014-06-23: qty 6.7

## 2014-06-23 MED ORDER — LEVETIRACETAM IN NACL 500 MG/100ML IV SOLN
500.0000 mg | Freq: Two times a day (BID) | INTRAVENOUS | Status: DC
  Administered 2014-06-23 – 2014-06-26 (×7): 500 mg via INTRAVENOUS
  Filled 2014-06-23 (×9): qty 100

## 2014-06-23 MED ORDER — CEFAZOLIN SODIUM-DEXTROSE 2-3 GM-% IV SOLR
2.0000 g | Freq: Three times a day (TID) | INTRAVENOUS | Status: AC
  Administered 2014-06-23 – 2014-06-24 (×2): 2 g via INTRAVENOUS
  Filled 2014-06-23 (×3): qty 50

## 2014-06-23 MED ORDER — STERILE WATER FOR INJECTION IJ SOLN
INTRAMUSCULAR | Status: AC
  Filled 2014-06-23: qty 10

## 2014-06-23 MED ORDER — FENTANYL CITRATE 0.05 MG/ML IJ SOLN
INTRAMUSCULAR | Status: AC
  Administered 2014-06-23: 50 ug via INTRAVENOUS
  Filled 2014-06-23: qty 2

## 2014-06-23 MED ORDER — ROCURONIUM BROMIDE 100 MG/10ML IV SOLN
INTRAVENOUS | Status: DC | PRN
  Administered 2014-06-23: 50 mg via INTRAVENOUS

## 2014-06-23 MED ORDER — CEFAZOLIN SODIUM-DEXTROSE 2-3 GM-% IV SOLR
INTRAVENOUS | Status: AC
  Administered 2014-06-23: 2 g via INTRAVENOUS
  Filled 2014-06-23: qty 50

## 2014-06-23 MED ORDER — DEXAMETHASONE SODIUM PHOSPHATE 4 MG/ML IJ SOLN
4.0000 mg | Freq: Three times a day (TID) | INTRAMUSCULAR | Status: DC
  Administered 2014-06-27: 4 mg via INTRAVENOUS
  Filled 2014-06-23: qty 1

## 2014-06-23 MED ORDER — BACITRACIN 50000 UNITS IM SOLR
INTRAMUSCULAR | Status: DC | PRN
  Administered 2014-06-23: 18:00:00

## 2014-06-23 MED ORDER — MORPHINE SULFATE 2 MG/ML IJ SOLN
1.0000 mg | INTRAMUSCULAR | Status: DC | PRN
  Administered 2014-06-23 – 2014-06-24 (×2): 2 mg via INTRAVENOUS
  Filled 2014-06-23 (×3): qty 1

## 2014-06-23 MED ORDER — SODIUM CHLORIDE 0.9 % IV SOLN
INTRAVENOUS | Status: DC | PRN
  Administered 2014-06-23 (×2): via INTRAVENOUS

## 2014-06-23 MED ORDER — HYDROCODONE-ACETAMINOPHEN 5-325 MG PO TABS
1.0000 | ORAL_TABLET | ORAL | Status: DC | PRN
Start: 2014-06-23 — End: 2014-06-28

## 2014-06-23 MED ORDER — ARTIFICIAL TEARS OP OINT
TOPICAL_OINTMENT | OPHTHALMIC | Status: DC | PRN
  Administered 2014-06-23: 1 via OPHTHALMIC

## 2014-06-23 MED ORDER — DEXAMETHASONE SODIUM PHOSPHATE 4 MG/ML IJ SOLN
4.0000 mg | Freq: Four times a day (QID) | INTRAMUSCULAR | Status: AC
  Administered 2014-06-26 (×4): 4 mg via INTRAVENOUS
  Filled 2014-06-23 (×5): qty 1

## 2014-06-23 MED ORDER — LABETALOL HCL 5 MG/ML IV SOLN
10.0000 mg | INTRAVENOUS | Status: DC | PRN
  Administered 2014-06-24: 10 mg via INTRAVENOUS
  Filled 2014-06-23: qty 4

## 2014-06-23 MED ORDER — REMIFENTANIL HCL 1 MG IV SOLR
0.0125 ug/kg/min | INTRAVENOUS | Status: DC
  Administered 2014-06-23: .2 ug/kg/min via INTRAVENOUS
  Filled 2014-06-23: qty 2000

## 2014-06-23 MED ORDER — ONDANSETRON HCL 4 MG/2ML IJ SOLN
INTRAMUSCULAR | Status: DC | PRN
  Administered 2014-06-23: 4 mg via INTRAVENOUS

## 2014-06-23 MED ORDER — FENTANYL CITRATE 0.05 MG/ML IJ SOLN
25.0000 ug | INTRAMUSCULAR | Status: DC | PRN
  Administered 2014-06-23: 50 ug via INTRAVENOUS

## 2014-06-23 MED ORDER — BUPIVACAINE-EPINEPHRINE 0.5% -1:200000 IJ SOLN
INTRAMUSCULAR | Status: DC | PRN
  Administered 2014-06-23: 10 mL

## 2014-06-23 MED ORDER — PROPOFOL 10 MG/ML IV BOLUS
INTRAVENOUS | Status: AC
  Filled 2014-06-23: qty 20

## 2014-06-23 MED ORDER — POTASSIUM CHLORIDE IN NACL 20-0.9 MEQ/L-% IV SOLN
INTRAVENOUS | Status: DC
  Administered 2014-06-23: 75 mL/h via INTRAVENOUS
  Administered 2014-06-24 – 2014-06-27 (×4): via INTRAVENOUS
  Filled 2014-06-23 (×7): qty 1000

## 2014-06-23 MED ORDER — DOCUSATE SODIUM 100 MG PO CAPS
100.0000 mg | ORAL_CAPSULE | Freq: Two times a day (BID) | ORAL | Status: DC
  Administered 2014-06-25 – 2014-06-28 (×7): 100 mg via ORAL
  Filled 2014-06-23 (×11): qty 1

## 2014-06-23 MED ORDER — PROPOFOL 10 MG/ML IV BOLUS
INTRAVENOUS | Status: DC | PRN
  Administered 2014-06-23 (×2): 50 mg via INTRAVENOUS
  Administered 2014-06-23: 20 mg via INTRAVENOUS
  Administered 2014-06-23: 130 mg via INTRAVENOUS

## 2014-06-23 MED ORDER — ONDANSETRON HCL 4 MG/2ML IJ SOLN: 4.0000 mg | INTRAMUSCULAR | Status: DC | PRN

## 2014-06-23 MED ORDER — DEXAMETHASONE SODIUM PHOSPHATE 10 MG/ML IJ SOLN
6.0000 mg | Freq: Four times a day (QID) | INTRAMUSCULAR | Status: AC
  Administered 2014-06-23 – 2014-06-25 (×8): 6 mg via INTRAVENOUS
  Filled 2014-06-23 (×2): qty 0.6
  Filled 2014-06-23 (×4): qty 1
  Filled 2014-06-23: qty 0.6
  Filled 2014-06-23 (×2): qty 1
  Filled 2014-06-23: qty 0.6
  Filled 2014-06-23: qty 1

## 2014-06-23 MED ORDER — VECURONIUM BROMIDE 10 MG IV SOLR
INTRAVENOUS | Status: DC | PRN
  Administered 2014-06-23 (×3): 2 mg via INTRAVENOUS

## 2014-06-23 MED ORDER — THROMBIN 20000 UNITS EX SOLR
CUTANEOUS | Status: DC | PRN
  Administered 2014-06-23: 18:00:00 via TOPICAL

## 2014-06-23 MED ORDER — CETYLPYRIDINIUM CHLORIDE 0.05 % MT LIQD
7.0000 mL | Freq: Two times a day (BID) | OROMUCOSAL | Status: DC
  Administered 2014-06-23 – 2014-06-28 (×8): 7 mL via OROMUCOSAL

## 2014-06-23 MED ORDER — ROCURONIUM BROMIDE 50 MG/5ML IV SOLN
INTRAVENOUS | Status: AC
  Filled 2014-06-23: qty 1

## 2014-06-23 MED ORDER — GLYCOPYRROLATE 0.2 MG/ML IJ SOLN
INTRAMUSCULAR | Status: DC | PRN
  Administered 2014-06-23: 0.6 mg via INTRAVENOUS

## 2014-06-23 MED ORDER — FENTANYL CITRATE 0.05 MG/ML IJ SOLN
INTRAMUSCULAR | Status: DC | PRN
  Administered 2014-06-23: 100 ug via INTRAVENOUS
  Administered 2014-06-23: 150 ug via INTRAVENOUS

## 2014-06-23 MED ORDER — VECURONIUM BROMIDE 10 MG IV SOLR
INTRAVENOUS | Status: AC
  Filled 2014-06-23: qty 10

## 2014-06-23 MED ORDER — MICROFIBRILLAR COLL HEMOSTAT EX PADS
MEDICATED_PAD | CUTANEOUS | Status: DC | PRN
  Administered 2014-06-23: 1 via TOPICAL

## 2014-06-23 SURGICAL SUPPLY — 87 items
BAG DECANTER FOR FLEXI CONT (MISCELLANEOUS) ×3 IMPLANT
BIT DRILL WIRE PASS 1.3MM (BIT) IMPLANT
BLADE CLIPPER SURG NEURO (BLADE) ×2 IMPLANT
BLADE ULTRA TIP 2M (BLADE) IMPLANT
BRUSH SCRUB EZ PLAIN DRY (MISCELLANEOUS) ×3 IMPLANT
BUR ACORN 6.0 PRECISION (BURR) ×2 IMPLANT
BUR ACORN 6.0MM PRECISION (BURR) ×1
BUR ROUTER D-58 CRANI (BURR) ×2 IMPLANT
CANISTER SUCT 3000ML (MISCELLANEOUS) ×4 IMPLANT
CATH VENTRIC 35X38 W/TROCAR LG (CATHETERS) IMPLANT
CLIP TI MEDIUM 6 (CLIP) IMPLANT
CONT SPEC 4OZ CLIKSEAL STRL BL (MISCELLANEOUS) ×6 IMPLANT
CORDS BIPOLAR (ELECTRODE) ×3 IMPLANT
COVER TABLE BACK 60X90 (DRAPES) IMPLANT
DRAIN SNY WOU 7FLT (WOUND CARE) IMPLANT
DRAPE MICROSCOPE LEICA (MISCELLANEOUS) ×3 IMPLANT
DRAPE NEUROLOGICAL W/INCISE (DRAPES) ×3 IMPLANT
DRAPE SURG 17X23 STRL (DRAPES) ×2 IMPLANT
DRAPE WARM FLUID 44X44 (DRAPE) ×3 IMPLANT
DRILL WIRE PASS 1.3MM (BIT)
DRSG TELFA 3X8 NADH (GAUZE/BANDAGES/DRESSINGS) IMPLANT
ELECT CAUTERY BLADE 6.4 (BLADE) ×3 IMPLANT
ELECT REM PT RETURN 9FT ADLT (ELECTROSURGICAL) ×3
ELECTRODE REM PT RTRN 9FT ADLT (ELECTROSURGICAL) ×1 IMPLANT
EVACUATOR 1/8 PVC DRAIN (DRAIN) IMPLANT
EVACUATOR SILICONE 100CC (DRAIN) IMPLANT
FORCEPS BIPOLAR SPETZLER 8 1.0 (NEUROSURGERY SUPPLIES) ×2 IMPLANT
GAUZE SPONGE 4X4 12PLY STRL (GAUZE/BANDAGES/DRESSINGS) ×2 IMPLANT
GAUZE SPONGE 4X4 16PLY XRAY LF (GAUZE/BANDAGES/DRESSINGS) IMPLANT
GLOVE BIO SURGEON STRL SZ 6.5 (GLOVE) ×2 IMPLANT
GLOVE BIO SURGEON STRL SZ8.5 (GLOVE) ×3 IMPLANT
GLOVE BIO SURGEONS STRL SZ 6.5 (GLOVE) ×2
GLOVE BIOGEL PI IND STRL 7.0 (GLOVE) IMPLANT
GLOVE BIOGEL PI INDICATOR 7.0 (GLOVE) ×2
GLOVE EXAM NITRILE LRG STRL (GLOVE) IMPLANT
GLOVE EXAM NITRILE MD LF STRL (GLOVE) IMPLANT
GLOVE EXAM NITRILE XL STR (GLOVE) IMPLANT
GLOVE EXAM NITRILE XS STR PU (GLOVE) IMPLANT
GLOVE SS BIOGEL STRL SZ 8 (GLOVE) ×1 IMPLANT
GLOVE SUPERSENSE BIOGEL SZ 8 (GLOVE) ×4
GOWN STRL REUS W/ TWL LRG LVL3 (GOWN DISPOSABLE) IMPLANT
GOWN STRL REUS W/ TWL XL LVL3 (GOWN DISPOSABLE) ×1 IMPLANT
GOWN STRL REUS W/TWL LRG LVL3 (GOWN DISPOSABLE) ×3
GOWN STRL REUS W/TWL XL LVL3 (GOWN DISPOSABLE) ×6
HEMOSTAT SURGICEL 2X14 (HEMOSTASIS) ×5 IMPLANT
KIT BASIN OR (CUSTOM PROCEDURE TRAY) ×3 IMPLANT
KIT DRAIN CSF ACCUDRAIN (MISCELLANEOUS) IMPLANT
KIT ROOM TURNOVER OR (KITS) ×3 IMPLANT
MARKER SKIN DUAL TIP RULER LAB (MISCELLANEOUS) IMPLANT
NEEDLE HYPO 22GX1.5 SAFETY (NEEDLE) ×3 IMPLANT
NS IRRIG 1000ML POUR BTL (IV SOLUTION) ×5 IMPLANT
PACK CRANIOTOMY (CUSTOM PROCEDURE TRAY) ×3 IMPLANT
PAD ARMBOARD 7.5X6 YLW CONV (MISCELLANEOUS) ×3 IMPLANT
PAD DRESSING TELFA 3X8 NADH (GAUZE/BANDAGES/DRESSINGS) IMPLANT
PATTIES SURGICAL .25X.25 (GAUZE/BANDAGES/DRESSINGS) IMPLANT
PATTIES SURGICAL .5 X.5 (GAUZE/BANDAGES/DRESSINGS) IMPLANT
PATTIES SURGICAL .5 X3 (DISPOSABLE) IMPLANT
PATTIES SURGICAL 1X1 (DISPOSABLE) IMPLANT
PIN MAYFIELD SKULL DISP (PIN) IMPLANT
PLATE 1.5  2HOLE LNG NEURO (Plate) ×12 IMPLANT
PLATE 1.5 2HOLE LNG NEURO (Plate) IMPLANT
RUBBERBAND STERILE (MISCELLANEOUS) ×6 IMPLANT
SCREW SELF DRILL HT 1.5/4MM (Screw) ×24 IMPLANT
SPECIMEN JAR SMALL (MISCELLANEOUS) IMPLANT
SPONGE NEURO XRAY DETECT 1X3 (DISPOSABLE) IMPLANT
SPONGE SURGIFOAM ABS GEL 100 (HEMOSTASIS) ×3 IMPLANT
STAPLER SKIN PROX WIDE 3.9 (STAPLE) ×3 IMPLANT
STOCKINETTE 6  STRL (DRAPES)
STOCKINETTE 6 STRL (DRAPES) IMPLANT
SUT ETHILON 3 0 FSL (SUTURE) ×2 IMPLANT
SUT ETHILON 3 0 PS 1 (SUTURE) IMPLANT
SUT NURALON 4 0 TR CR/8 (SUTURE) ×8 IMPLANT
SUT PROLENE 6 0 BV (SUTURE) IMPLANT
SUT SILK 0 TIES 10X30 (SUTURE) IMPLANT
SUT VIC AB 2-0 CP2 18 (SUTURE) ×5 IMPLANT
SUT VIC AB 3-0 FS2 27 (SUTURE) ×2 IMPLANT
SUT VICRYL 4-0 PS2 18IN ABS (SUTURE) IMPLANT
SYR 20ML ECCENTRIC (SYRINGE) ×1 IMPLANT
SYR CONTROL 10ML LL (SYRINGE) ×1 IMPLANT
TAPE CLOTH SURG 4X10 WHT LF (GAUZE/BANDAGES/DRESSINGS) ×2 IMPLANT
TOWEL OR 17X24 6PK STRL BLUE (TOWEL DISPOSABLE) ×3 IMPLANT
TOWEL OR 17X26 10 PK STRL BLUE (TOWEL DISPOSABLE) ×3 IMPLANT
TRAY FOLEY CATH 14FRSI W/METER (CATHETERS) ×2 IMPLANT
TUBE CONNECTING 12'X1/4 (SUCTIONS)
TUBE CONNECTING 12X1/4 (SUCTIONS) IMPLANT
UNDERPAD 30X30 INCONTINENT (UNDERPADS AND DIAPERS) ×2 IMPLANT
WATER STERILE IRR 1000ML POUR (IV SOLUTION) ×3 IMPLANT

## 2014-06-23 NOTE — Anesthesia Procedure Notes (Addendum)
Procedure Name: Intubation Date/Time: 06/23/2014 5:00 PM Performed by: Sampson Si E Pre-anesthesia Checklist: Patient identified, Emergency Drugs available, Suction available, Patient being monitored and Timeout performed Patient Re-evaluated:Patient Re-evaluated prior to inductionOxygen Delivery Method: Circle system utilized Preoxygenation: Pre-oxygenation with 100% oxygen Intubation Type: IV induction Ventilation: Mask ventilation without difficulty Laryngoscope Size: Mac and 3 Grade View: Grade I Tube type: Subglottic suction tube Tube size: 7.0 mm Number of attempts: 1 Airway Equipment and Method: Stylet Placement Confirmation: ETT inserted through vocal cords under direct vision,  positive ETCO2 and breath sounds checked- equal and bilateral Secured at: 22 cm Tube secured with: Tape Dental Injury: Teeth and Oropharynx as per pre-operative assessment

## 2014-06-23 NOTE — Progress Notes (Signed)
Patient ID: [name], female   DOB: 09/15/1950, 63 y.o.   MRN: [id number] Subjective:  The patient is alert and pleasant. She is in no apparent distress. She looks well.  Objective: Vital signs in last 24 hours: Temp:  [97.6 F (36.4 C)-98.6 F (37 C)] 97.7 F (36.5 C) (10/15 2003) Pulse Rate:  [56-86] 86 (10/15 2003) Resp:  [16-41] 23 (10/15 2003) BP: (129-171)/(65-98) 129/98 mmHg (10/15 2003) SpO2:  [94 %-100 %] 100 % (10/15 2003)  Intake/Output from previous day: 10/14 0701 - 10/15 0700 In: 320 [P.O.:320] Out: -  Intake/Output this shift: Total I/O In: 1500 [I.V.:1500] Out: 250 [Urine:200; Blood:50]  Physical exam the patient is alert and pleasant. She is moving all 4 extremities well. Her speech is normal.  Lab Results: No results found for this basename: WBC, HGB, HCT, PLT,  in the last 72 hours BMET No results found for this basename: NA, K, CL, CO2, GLUCOSE, BUN, CREATININE, CALCIUM,  in the last 72 hours  Studies/Results: Ct Head W Contrast  06/22/2014   CLINICAL DATA:  Stealth head protocol. Brain lab evaluation for brain tumor.  EXAM: CT HEAD WITH CONTRAST  TECHNIQUE: Contiguous axial images were obtained from the base of the skull through the vertex with intravenous contrast.  CONTRAST:  180mL OMNIPAQUE IOHEXOL 300 MG/ML  SOLN  COMPARISON:  MR head 06/18/2014.  CT head 06/17/2014.  FINDINGS: There is a necrotic LEFT frontal parasagittal mass, associated with punctate calcification, and possible blood products, measuring 24 x 31 mm on image 134 displaying surrounding vasogenic edema, consistent with a metastasis. There is an enhancing, less necrotic LEFT occipital mass measuring 16 x 14 mm as seen on image 103 also with surrounding edema. This too is concerning for metastasis.  IMPRESSION: LEFT hemisphere mass lesions as described. Good general agreement with prior studies.   Electronically Signed   By: Rolla Flatten M.D.   On: 06/22/2014 12:12    Assessment/Plan: The  patient is doing well.  LOS: 6 days     Vermon Grays D 06/23/2014, 8:07 PM

## 2014-06-23 NOTE — Progress Notes (Signed)
eLink Physician-Brief Progress Note Patient Name: [name] DOB: 02-09-1951 MRN: [id number]   Date of Service  06/23/2014  HPI/Events of Note  63 year old white female who's had a history of small cell lung cancer approximate 7 years ago, tobacco abuse, bipolar disorder that was brought to [facility] ER with mental status changes. She was worked up with a brain CT which demonstrated a left frontal mass. Further workup included a CT of the chest abdomen and pelvis which was negative. Attempts were made to get a brain MRI but she has some sort of metallic object which cause a lot of artifact on the MRI. A head CT demonstrated a left frontal and a left parietal occipital mass. She was transferred to [facility] and seen by neurosurgery who discussed surgical option with the patient and family.  Patient had the following procedure performed: Left frontal craniotomy for gross total resection of a left frontal brain tumor Left frontal occipital craniotomy for gross total resection of left parietal occipital tumor using a separate incision. Using BrainLab neuro navigation   eICU Interventions  1. Left frontal mass - s\p resection - neurochecks - monitor glucose and blood pressure - seizure\fall precautions  2. Hx of Small cell lung ca - surveillance CT of chest with no masses - continue to monitor  DVT prophylaxis- scds GI prophylaxis - protonix      Intervention Category Evaluation Type: New Patient Evaluation  [name] 06/23/2014, 10:29 PM

## 2014-06-23 NOTE — Progress Notes (Signed)
Subjective:  The patient is alert and pleasant. She is in no apparent distress.  Objective: Vital signs in last 24 hours: Temp:  [97.5 F (36.4 C)-98.6 F (37 C)] 98.3 F (36.8 C) (10/15 1410) Pulse Rate:  [56-70] 61 (10/15 1410) Resp:  [16-18] 18 (10/15 1410) BP: (102-171)/(35-80) 157/71 mmHg (10/15 1410) SpO2:  [94 %-100 %] 94 % (10/15 1410)  Intake/Output from previous day: 10/14 0701 - 10/15 0700 In: 320 [P.O.:320] Out: -  Intake/Output this shift:    Physical exam the patient is alert and oriented x3. Her speech is normal. Her strength is normal.   The patient's head CT performed yesterday with and without contrast demonstrates a left frontal and a left parietal occipital lesion.  Lab Results: No results found for this basename: WBC, HGB, HCT, PLT,  in the last 72 hours BMET No results found for this basename: NA, K, CL, CO2, GLUCOSE, BUN, CREATININE, CALCIUM,  in the last 72 hours  Studies/Results: Ct Head W Contrast  06/22/2014   CLINICAL DATA:  Stealth head protocol. Brain lab evaluation for brain tumor.  EXAM: CT HEAD WITH CONTRAST  TECHNIQUE: Contiguous axial images were obtained from the base of the skull through the vertex with intravenous contrast.  CONTRAST:  140mL OMNIPAQUE IOHEXOL 300 MG/ML  SOLN  COMPARISON:  MR head 06/18/2014.  CT head 06/17/2014.  FINDINGS: There is a necrotic LEFT frontal parasagittal mass, associated with punctate calcification, and possible blood products, measuring 24 x 31 mm on image 134 displaying surrounding vasogenic edema, consistent with a metastasis. There is an enhancing, less necrotic LEFT occipital mass measuring 16 x 14 mm as seen on image 103 also with surrounding edema. This too is concerning for metastasis.  IMPRESSION: LEFT hemisphere mass lesions as described. Good general agreement with prior studies.   Electronically Signed   By: Rolla Flatten M.D.   On: 06/22/2014 12:12    Assessment/Plan: (Tumor is: I have discussed  situation with the patient. We have discussed the various treatment options including craniotomies for resection of these lesions. I have described the surgery to her. We have discussed the risks, benefits, alternatives, and likelihood of achieving our goals with surgery. I have answered all the patient's questions. She has decided to proceed with surgery.  LOS: 6 days     Lemon Whitacre D 06/23/2014, 3:46 PM

## 2014-06-23 NOTE — Progress Notes (Addendum)
Physical Therapy Treatment Patient Details Name: [name] MRN: [id number] DOB: 1950/10/13 Today's Date: 06/23/2014    History of Present Illness The patient is a 63 year old white female who by report has been losing weight, falling, and had a decreased mental status. The patient was brought to the [facility] long emergency department where she was evaluated by Dr. Nicholes Stairs. The evaluation included a head CT which demonstrated a left frontal lesion. A neurosurgical consultation was requested. The patient was transferred to [facility] for further neurosurgical care. Pt  presents with left frontal lobe tumor.     PT Comments    **Pt ambulated 14' with min assist for balance today, this is an increase in overall gait distance. She is unsteady in sitting and standing and required assist to avoid falling several times during PT session. Word finding difficulty persists. Noted plan for L craniotomy for excision of tumor later today.  *  Follow Up Recommendations  SNF;Home health PT (needs 24* assist, HHPT if family able to provide this, SNF if not)     Equipment Recommendations  Other (comment) (confirm with family pt has a rw )    Recommendations for Other Services OT consult;Speech consult     Precautions / Restrictions Precautions Precautions: Fall Restrictions Weight Bearing Restrictions: No    Mobility  Bed Mobility Overal bed mobility: Needs Assistance Bed Mobility: Supine to Sit;Rolling;Sit to Supine Rolling: Mod assist   Supine to sit: Mod assist Sit to supine: Mod assist   General bed mobility comments: assistance with trunk and BLEs  Transfers Overall transfer level: Needs assistance Equipment used: Rolling walker (2 wheeled) Transfers: Sit to/from Stand Sit to Stand: Min assist;Mod assist         General transfer comment: cues for forward weight shift and hand placement, Min A to rise, Mod A for balance upon standing  Ambulation/Gait Ambulation/Gait  assistance: Min assist Ambulation Distance (Feet): 75 Feet   Gait Pattern/deviations: Step-through pattern;Decreased stance time - left;Decreased step length - right   Gait velocity interpretation: Below normal speed for age/gender General Gait Details: Assist to weight shift left, min A for balance especially with turns.    Stairs            Wheelchair Mobility    Modified Rankin (Stroke Patients Only)       Balance   Sitting-balance support: Feet supported Sitting balance-Leahy Scale: Poor Sitting balance - Comments: pt had LOB posteriorly with sitting at EOB, mod assist required Postural control: Posterior lean   Standing balance-Leahy Scale: Zero Standing balance comment: initially upon standing pt had LOB posteriorly and to R requiring max assist to prevent fall                    Cognition Arousal/Alertness: Awake/alert Behavior During Therapy: WFL for tasks assessed/performed;Flat affect Overall Cognitive Status: No family/caregiver present to determine baseline cognitive functioning Area of Impairment: Following commands;Problem solving       Following Commands: Follows one step commands inconsistently     Problem Solving: Slow processing General Comments: word finding difficulty, able to answer yes/no to questions but has difficulty elaborating, slow processing time    Exercises      General Comments        Pertinent Vitals/Pain Pain Assessment: No/denies pain    Home Living                      Prior Function  PT Goals (current goals can now be found in the care plan section) Acute Rehab PT Goals Patient Stated Goal: To walk PT Goal Formulation: With patient Time For Goal Achievement: 07/04/14 Potential to Achieve Goals: Good Progress towards PT goals: Progressing toward goals    Frequency  Min 3X/week    PT Plan Current plan remains appropriate    Co-evaluation             End of Session  Equipment Utilized During Treatment: Gait belt Activity Tolerance: Patient limited by fatigue Patient left: in bed;with call bell/phone within reach;with bed alarm set     Time: 1740-8144 PT Time Calculation (min): 9 min  Charges:  $Gait Training: 8-22 mins                    G Codes:      Philomena Doheny 06/23/2014, 1:45 PM [phone]

## 2014-06-23 NOTE — Op Note (Signed)
Brief history: The patient is a 63 year old white female who's had a history of small cell lung cancer approximate 7 years ago. She presented to the ER with mental status changes. She was worked up with a brain CT which demonstrated a left frontal mass. Further workup included a CT of the chest abdomen and pelvis which was negative. Attempts were made to get a brain MRI but she has some sort of metallic object which cause a lot of artifact on the MRI. A head CT demonstrated a left frontal and a left parietal occipital mass. I discussed situation with the patient and her brother. I discussed the various treatment options with the patient including surgery. I explained the procedure of craniotomies for evacuation of the tumors. I explained the risks, benefits, alternatives, and likelihood of achieving our goals with surgery. I have answered all the patient's questions. She has decided to proceed with surgery.  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 Earle Gell  Assistant: Dr. Erline Levine  Anesthesia: Gen. endotracheal  Estimated blood loss: 125 cc  Specimens: Tumor  Drains: None  Complications: None  Description of procedure: The patient was brought to the operating room by the anesthesia team. General endotracheal anesthesia was induced. I applied the [place] 3 point headrest of the patient's calvarium. The patient's left scalp was then shaved with clippers and prepared with Betadine scrub and Betadine solution.. Sterile drapes were applied. I entered the cordinants into the Brain[employer] nor navigation system. I injected the area to be incised with Marcaine with epinephrine solution.  I then used a scalpel to make a linear incision just to the left of midline and left frontal region. I used Raney clips for wound edge hemostasis. I exposed the underlying calvarium with the periosteal elevators. We used a cerebellar retractor for exposure. I confirmed the location of the craniotomy flap using the Brain[employer] neuro navigation. I then used the high-speed drill to create a burr hole and a craniotomy flap. I incised the dura with a 15 blade scalpel and continued the incision with the Metzenbaum scissors in a cruciate fashion. I tacked epidural edges. I then used bipolar electrocautery and suction to create a small corticotomy. I encountered the tumor and a few millimeters deep. The tumor itself was distinct from the brain. It appeared to be a metastasis. I was able to dissected around the edges of the tumor after I internally debulk it. Using suction and bipolar cautery I was able to get a gross total resection of the left frontal tumor. I obtained hemostasis at the tumor resection cavity using bipolar electrocautery. I irrigated the wound out with bacitracin and saline solution. I lined the tumor resection cavity with Surgicel. I then removed the retractors and reapproximated patient's dura with interrupted 4-0 Nurolon suture. I placed a piece of Gelfoam over the exposed dura and replaced the craniotomy flap with titanium mini plates and screws. I then reapproximated patient's galea with interrupted 2-0 Vicryl suture. I then reapproximated the skin with stainless steel staples.  We now turned attention to the parietal-occipital craniotomy. I made an incision just to left of midline overlying the tumor. I used the Brain[employer] neuro navigation unit to confirm the site of the craniotomy. I created a  burr hole using a high-speed drill and craniotomy flap with a high-speed drill. I elevated the craniotomy flap. I incised the underlying dura in a cruciate fashion. I tacked epidural edges. I used suction  irrigation and bipolar cautery to create a small corticotomy overlying the tumor. We identified the tumor. It was quite small. We obtained a specimen and internally debulk the end using bipolar cautery and suction were able to get a gross total resection of the proximal supple tumor. I then obtained hemostasis in the resection cavity using bipolar electrocautery. I irrigated the resection cavity out with saline and bacitracin solution. I then lined the tumor resection cavity with Surgicel. I then reapproximated patient's dural edges with 4-0 Nurolon suture. I laid a piece of Gelfoam over the exposed dura and replaced the craniotomy flap with titanium mini plates and screws. We then removed the retractor. I reapproximated patient's galea with interrupted 2-0 Vicryl suture. I reapproximated the skin with stainless steel staples. The wounds were then coated with bacitracin ointment. A sterile dressing was applied. The drapes and retractor were removed. By report all sponge, needle, and instrument counts were correct at the end of this case.

## 2014-06-23 NOTE — Anesthesia Preprocedure Evaluation (Addendum)
Anesthesia Evaluation  Patient identified by MRN, date of birth, ID band Patient awake    Reviewed: Allergy & Precautions, H&P , NPO status , Patient's Chart, lab work & pertinent test results  Airway Mallampati: II TM Distance: <3 FB Neck ROM: Full    Dental  (+) Edentulous Upper, Edentulous Lower, Dental Advisory Given   Pulmonary Current Smoker,  breath sounds clear to auscultation        Cardiovascular hypertension, Rhythm:Regular Rate:Normal     Neuro/Psych    GI/Hepatic negative GI ROS, Neg liver ROS,   Endo/Other  negative endocrine ROS  Renal/GU negative Renal ROS     Musculoskeletal   Abdominal   Peds  Hematology   Anesthesia Other Findings   Reproductive/Obstetrics                        Anesthesia Physical Anesthesia Plan  ASA: III  Anesthesia Plan: General   Post-op Pain Management:    Induction: Intravenous  Airway Management Planned: Oral ETT  Additional Equipment: Arterial line  Intra-op Plan:   Post-operative Plan: Possible Post-op intubation/ventilation  Informed Consent: I have reviewed the patients History and Physical, chart, labs and discussed the procedure including the risks, benefits and alternatives for the proposed anesthesia with the patient or authorized representative who has indicated his/her understanding and acceptance.   Dental advisory given  Plan Discussed with: CRNA, Anesthesiologist and Surgeon  Anesthesia Plan Comments:        Anesthesia Quick Evaluation

## 2014-06-23 NOTE — Progress Notes (Signed)
UR complete.  Geraldine Tesar RN, MSN 

## 2014-06-23 NOTE — Anesthesia Postprocedure Evaluation (Signed)
Anesthesia Post Note  Patient: [name]  Procedure(s) Performed: Procedure(s) (LRB): Left craniotomy for tumor resection (Left)  Anesthesia type: general  Patient location: PACU  Post pain: Pain level controlled  Post assessment: Patient's Cardiovascular Status Stable  Last Vitals:  Filed Vitals:   06/23/14 2030  BP: 135/66  Pulse: 65  Temp:   Resp: 10    Post vital signs: Reviewed and stable  Level of consciousness: sedated  Complications: No apparent anesthesia complications

## 2014-06-23 NOTE — Transfer of Care (Signed)
Immediate Anesthesia Transfer of Care Note  Patient: [name]  Procedure(s) Performed: Procedure(s) with comments: Left craniotomy for tumor resection (Left) - Left craniotomy for tumor resection  Patient Location: PACU  Anesthesia Type:General  Level of Consciousness: awake and alert   Airway & Oxygen Therapy: Patient connected to nasal cannula oxygen  Post-op Assessment: Report given to PACU RN, Post -op Vital signs reviewed and stable and Patient moving all extremities X 4  Post vital signs: Reviewed and stable  Complications: No apparent anesthesia complications

## 2014-06-24 ENCOUNTER — Inpatient Hospital Stay (HOSPITAL_COMMUNITY): Payer: Medicaid Other

## 2014-06-24 LAB — GLUCOSE, CAPILLARY
GLUCOSE-CAPILLARY: 101 mg/dL — AB (ref 70–99)
GLUCOSE-CAPILLARY: 115 mg/dL — AB (ref 70–99)
Glucose-Capillary: 210 mg/dL — ABNORMAL HIGH (ref 70–99)
Glucose-Capillary: 242 mg/dL — ABNORMAL HIGH (ref 70–99)

## 2014-06-24 MED ORDER — GADOBENATE DIMEGLUMINE 529 MG/ML IV SOLN
10.0000 mL | Freq: Once | INTRAVENOUS | Status: AC | PRN
  Administered 2014-06-24: 10 mL via INTRAVENOUS

## 2014-06-24 MED ORDER — GADOBENATE DIMEGLUMINE 529 MG/ML IV SOLN: 10.0000 mL | Freq: Once | INTRAVENOUS | Status: DC

## 2014-06-24 NOTE — Addendum Note (Signed)
Addendum created 06/24/14 8335 by Valetta Fuller, CRNA   Modules edited: Anesthesia Events

## 2014-06-24 NOTE — Progress Notes (Signed)
Patient ID: [name], female   DOB: June 27, 1951, 63 y.o.   MRN: [id number] Subjective:  The patient is alert and pleasant. She is mildly confused.  Objective: Vital signs in last 24 hours: Temp:  [97 F (36.1 C)-98.3 F (36.8 C)] 97 F (36.1 C) (10/16 0749) Pulse Rate:  [54-86] 54 (10/16 0700) Resp:  [7-41] 7 (10/16 0700) BP: (125-164)/(60-98) 152/61 mmHg (10/16 0700) SpO2:  [94 %-100 %] 100 % (10/16 0700) Arterial Line BP: (96-169)/(68-121) 98/73 mmHg (10/16 0700) Weight:  [59.1 kg (130 lb 4.7 oz)] 59.1 kg (130 lb 4.7 oz) (10/15 2120)  Intake/Output from previous day: 10/15 0701 - 10/16 0700 In: 2820 [I.V.:2620; IV Piggyback:200] Out: 985 [Urine:935; Blood:50] Intake/Output this shift:    Physical exam patient is alert and pleasant. She is moving all 4 extremities. She is mildly confused. She has a mild dysphagia. Her pupils are equal.  Lab Results: No results found for this basename: WBC, HGB, HCT, PLT,  in the last 72 hours BMET No results found for this basename: NA, K, CL, CO2, GLUCOSE, BUN, CREATININE, CALCIUM,  in the last 72 hours  Studies/Results: Ct Head W Contrast  06/22/2014   CLINICAL DATA:  Stealth head protocol. Brain lab evaluation for brain tumor.  EXAM: CT HEAD WITH CONTRAST  TECHNIQUE: Contiguous axial images were obtained from the base of the skull through the vertex with intravenous contrast.  CONTRAST:  14mL OMNIPAQUE IOHEXOL 300 MG/ML  SOLN  COMPARISON:  MR head 06/18/2014.  CT head 06/17/2014.  FINDINGS: There is a necrotic LEFT frontal parasagittal mass, associated with punctate calcification, and possible blood products, measuring 24 x 31 mm on image 134 displaying surrounding vasogenic edema, consistent with a metastasis. There is an enhancing, less necrotic LEFT occipital mass measuring 16 x 14 mm as seen on image 103 also with surrounding edema. This too is concerning for metastasis.  IMPRESSION: LEFT hemisphere mass lesions as described. Good general  agreement with prior studies.   Electronically Signed   By: Rolla Flatten M.D.   On: 06/22/2014 12:12    Assessment/Plan: Postop day 1: The patient is doing well clinically. We will get a followup brain MRI. We will await pathology. The lesions appear to be metastasis.  LOS: 7 days     Emmy Keng D 06/24/2014, 7:50 AM

## 2014-06-24 NOTE — Progress Notes (Signed)
PT Cancellation Note  Patient Details Name: [name] MRN: [id number] DOB: September 13, 1950   Cancelled Treatment:    Reason Eval/Treat Not Completed: Other (comment) (awaiting update from nsg regarding PT post Op)   Duncan Dull 06/24/2014, 11:26 AM Alben Deeds, PT DPT  [phone]

## 2014-06-24 NOTE — Progress Notes (Signed)
[name] underwent craniotomy with gross resection of 2 tumors. This was done yesterday. I really think Dr. Arnoldo Morale for doing this. She now is in the neuro ICU.  Hopefully there might be some pulmonary path report out today.  Neurologically, she still about the same. Her speech is still somewhat difficult to understand.  It will be fascinating to see what the path report is. Again, I would think it would be very unusual to see recurrent small cell lung cancer owing to the brain without any obvious systemic disease. From what Dr. Arnoldo Morale said, he thought that the tumors appeared to be metastatic. If there were 2 tumors, then one would think that metastatic disease would be likely.   We will continue follow along.  I would think that since we don't see any obvious systemic disease, that radiation therapy will be the treatment of choice. She has been seen by radiation oncology already.  [place] 91:1-2

## 2014-06-25 LAB — GLUCOSE, CAPILLARY
GLUCOSE-CAPILLARY: 147 mg/dL — AB (ref 70–99)
Glucose-Capillary: 165 mg/dL — ABNORMAL HIGH (ref 70–99)
Glucose-Capillary: 143 mg/dL — ABNORMAL HIGH (ref 70–99)
Glucose-Capillary: 137 mg/dL — ABNORMAL HIGH (ref 70–99)

## 2014-06-25 NOTE — Progress Notes (Signed)
No issues overnight.  EXAM:  BP 130/67  Pulse 61  Temp(Src) 98.4 F (36.9 C) (Oral)  Resp 18  Ht 5\' 1"  (1.549 m)  Wt 59.1 kg (130 lb 4.7 oz)  BMI 24.63 kg/m2  SpO2 99%  Awake, alert Expressive aphasia, can say name and repeat. Difficulty with object naming Follows simple 1-step commands Appears to have good strength bilaterally Wounds c/d/i  IMAGING: Postop MRI reviewed with shows minimal blood products in resection cavities. No residual tumor.  IMPRESSION:  63 y.o. female s/p crani for resection of left frontal and parietal mets Neurologically stable  PLAN: - Mobilize with PT/OT - Will likely need placement

## 2014-06-26 LAB — GLUCOSE, CAPILLARY
GLUCOSE-CAPILLARY: 107 mg/dL — AB (ref 70–99)
GLUCOSE-CAPILLARY: 133 mg/dL — AB (ref 70–99)
GLUCOSE-CAPILLARY: 105 mg/dL — AB (ref 70–99)
GLUCOSE-CAPILLARY: 114 mg/dL — AB (ref 70–99)

## 2014-06-26 NOTE — Progress Notes (Signed)
No issues or problems overnight. Exam stable. Afebrile. Vitals are stable.  Status post craniotomy and resection of a metastatic disease. Continue efforts at mobilization. Likely will require skilled nursing facility placement.

## 2014-06-26 NOTE — Progress Notes (Signed)
Physical Therapy RE-EVAL Patient Details Name: [name] MRN: [id number] DOB: 25-Oct-1950 Today's Date: 06/26/2014    History of Present Illness The patient is a 63 year old female admitted to [facility] on 06/17/14 due to recent h/o weight loss, falling, and decreased mental status. Head CT demonstrated a left frontal lesion. A neurosurgical consultation was requested.  The pt  was dx with left frontal lobe tumor.   Pt is now s/p left frontal crani and left frontal occipital crani throgh separate incision to remove brain tumor on 06/23/14.  Pt with other significant PMHx of bipolar disorder.    PT Comments    Pt post-op re-eval preformed.  Pt continues to require assist for all mobility and is at high risk for falls based on her gait pattern and gait speed.  She has significant cognitive and speech deficits and she would benefit from CIR level therapies for the multidisciplinary intense level of therapy before discharging home.  PT recommending CIR consult.  PT will continue to follow acutely.  Goals updated based on re-eval assessment (see below).    Follow Up Recommendations  CIR     Equipment Recommendations  Rolling walker with 5" wheels    Recommendations for Other Services Rehab consult     Precautions / Restrictions Precautions Precautions: Fall Precaution Comments: due to instability in standing.     Mobility  Bed Mobility Overal bed mobility: Needs Assistance Bed Mobility: Supine to Sit;Sit to Supine Rolling: Min assist   Supine to sit: Min assist Sit to supine: Min assist   General bed mobility comments: Min assist to support trunk during transition to sit, assit to help lift right leg only back into the bed when going to supine.  Pt using bed rail for leverage during both transitions.   Transfers Overall transfer level: Needs assistance Equipment used: Rolling walker (2 wheeled) Transfers: Sit to/from Stand Sit to Stand: Min assist         General transfer  comment: Min assist to support trunk during transitions. Pt leaning posteriorly and to the right.   Ambulation/Gait Ambulation/Gait assistance: Min assist Ambulation Distance (Feet): 80 Feet (with one seated rest break) Assistive device: Rolling walker (2 wheeled) Gait Pattern/deviations: Step-through pattern;Decreased step length - right;Decreased dorsiflexion - right;Decreased weight shift to left;Shuffle;Drifts right/left Gait velocity: decreased Gait velocity interpretation: <1.8 ft/sec, indicative of risk for recurrent falls General Gait Details: Pt listing to the right and trunk leaning to the right.  Pt with decreased progression of right foot foward during gait compared to left foot (functionally making the right leg seem weaker, however, during seated MMT both seemed equally weak).           Balance Overall balance assessment: Needs assistance Sitting-balance support: Feet supported;Bilateral upper extremity supported Sitting balance-Leahy Scale: Poor Sitting balance - Comments: LOB posteriorly and when asked, "which way are you leaning" she stated, "forward, I think" Postural control: Posterior lean;Right lateral lean Standing balance support: Bilateral upper extremity supported Standing balance-Leahy Scale: Poor Standing balance comment: needs external support in standing.                     Cognition Arousal/Alertness: Awake/alert Behavior During Therapy: WFL for tasks assessed/performed;Flat affect Overall Cognitive Status: Impaired/Different from baseline Area of Impairment: Orientation;Attention;Memory;Following commands;Safety/judgement;Awareness;Problem solving Orientation Level: Disoriented to;Time;Situation Current Attention Level: Sustained Memory: Decreased short-term memory Following Commands: Follows one step commands with increased time Safety/Judgement: Decreased awareness of safety;Decreased awareness of deficits Awareness: Intellectual Problem  Solving: Slow processing;Difficulty  sequencing;Requires verbal cues;Requires tactile cues General Comments: Pt with expressive difficulties.  Does well with one step commands.  Not aware at all of her deficits in balance, strength, which way she is leaning while walking.        General Comments General comments (skin integrity, edema, etc.): Re-eval completed.  Pt seated MMT 3+ to 4/5 throughout bil lower extremities.  No obvious signs of asymmetry until gait was observed.        Pertinent Vitals/Pain Pain Assessment: No/denies pain           PT Goals (current goals can now be found in the care plan section) Acute Rehab PT Goals Patient Stated Goal: none stated Progress towards PT goals: Progressing toward goals (re-eval completed, goals assessed and updated)    Frequency  Min 3X/week    PT Plan Discharge plan needs to be updated       End of Session Equipment Utilized During Treatment: Gait belt Activity Tolerance: Patient tolerated treatment well Patient left: in bed;with call bell/phone within reach;with bed alarm set     Time: 8757-9728 PT Time Calculation (min): 27 min  Charges:  $Gait Training: 8-22 mins                      [name], PT, DPT [phone]   06/26/2014, 12:37 PM

## 2014-06-27 LAB — GLUCOSE, CAPILLARY
Glucose-Capillary: 90 mg/dL (ref 70–99)
Glucose-Capillary: 181 mg/dL — ABNORMAL HIGH (ref 70–99)
Glucose-Capillary: 147 mg/dL — ABNORMAL HIGH (ref 70–99)
Glucose-Capillary: 186 mg/dL — ABNORMAL HIGH (ref 70–99)

## 2014-06-27 MED ORDER — PANTOPRAZOLE SODIUM 40 MG PO TBEC
40.0000 mg | DELAYED_RELEASE_TABLET | Freq: Every day | ORAL | Status: DC
  Administered 2014-06-27 – 2014-06-28 (×2): 40 mg via ORAL
  Filled 2014-06-27 (×2): qty 1

## 2014-06-27 MED ORDER — LEVETIRACETAM 500 MG PO TABS
500.0000 mg | ORAL_TABLET | Freq: Two times a day (BID) | ORAL | Status: DC
  Administered 2014-06-27 – 2014-06-28 (×3): 500 mg via ORAL
  Filled 2014-06-27 (×3): qty 1

## 2014-06-27 MED ORDER — DEXAMETHASONE 2 MG PO TABS
2.0000 mg | ORAL_TABLET | Freq: Three times a day (TID) | ORAL | Status: DC
  Administered 2014-06-27 – 2014-06-28 (×3): 2 mg via ORAL
  Filled 2014-06-27 (×3): qty 1

## 2014-06-27 NOTE — Progress Notes (Signed)
Patient ID: [name], female   DOB: 06/26/1951, 63 y.o.   MRN: [id number] Subjective:  The patient is alert and pleasant. She has no complaints.  Objective: Vital signs in last 24 hours: Temp:  [97.4 F (36.3 C)-98.6 F (37 C)] 97.7 F (36.5 C) (10/19 0954) Pulse Rate:  [61-80] 80 (10/19 0954) Resp:  [16-18] 18 (10/19 0954) BP: (120-159)/(68-94) 120/73 mmHg (10/19 0954) SpO2:  [100 %] 100 % (10/19 0954)  Intake/Output from previous day: 10/18 0701 - 10/19 0700 In: 600 [P.O.:600] Out: -  Intake/Output this shift:    Physical exam the patient is alert and oriented x3. Her speech is normal. Her strength is normal. Her wounds are healing well.  Lab Results: No results found for this basename: WBC, HGB, HCT, PLT,  in the last 72 hours BMET No results found for this basename: NA, K, CL, CO2, GLUCOSE, BUN, CREATININE, CALCIUM,  in the last 72 hours  Studies/Results: No results found.  Assessment/Plan: Postop day #4: The patient is doing well. We are awaiting pathology. The lesions appeared to be metastasis. I will asked the case managers to discuss with the patient's family where she is going from here, i.e., a sheet going to go back to live with her brother or 2 we need  skilled nursing facility placement.  LOS: 10 days     Camyra Vaeth D 06/27/2014, 9:55 AM

## 2014-06-27 NOTE — Consult Note (Signed)
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 Arnoldo Morale   HPI: [name] is a 63 y.o. right-handed female with history of small cell lung cancer initially diagnosed 7 years ago. Patient lives with her brother and reportedly use a walker prior to admission. Admitted 06/18/2014 with altered mental status and recent falls. Cranial CT scan demonstrated large frontal mass. Workup included CT of chest and abdomen and pelvis which were negative. Attempts were made to get brain MRI that she had some sort of metallic object which caused a lot of artifact on the MRI imaging. If old CT of the head demonstrated large frontal and left parietal-occipital mass. Underwent left frontal craniotomy for gross total resection of frontal brain tumor, left suboccipital craniotomy for gross total resection of left occipital tumor 06/23/2014 per Dr. Arnoldo Morale. Await oncology followup question plan radiation therapy. Patient remains on Decadron protocol. Physical therapy reevaluation completed after craniotomy and noted expressive difficulties and decreasing cognition with recommendations of physical medicine rehabilitation consult.  Review of Systems  Musculoskeletal: Positive for falls.  Neurological: Positive for headaches.  Psychiatric/Behavioral:       Bipolar disorder  All other systems reviewed and are negative.  Past Medical History  Diagnosis Date  . Bipolar disorder   . Cancer     small call lung cancer   History reviewed. No pertinent past surgical history. History reviewed. No pertinent family history. Social History:  reports that she has been smoking.  She does not have any smokeless tobacco history on file. She reports that she does not drink alcohol or use illicit drugs. Allergies: No Known Allergies No prescriptions prior to admission    Home: Home Living Family/patient expects to be  discharged to:: Private residence Living Arrangements: Other relatives Available Help at Discharge: Family Type of Home: House Home Access: Stairs to enter [employer] of Steps: unsure Home Layout: One level Home Equipment: Environmental consultant - 2 wheels;Wheelchair - manual  Functional History: Prior Function Comments: unsure, pt having expressive difficulties and decreased cogntion Functional Status:  Mobility: Bed Mobility Overal bed mobility: Needs Assistance Bed Mobility: Supine to Sit;Sit to Supine Rolling: Min assist Supine to sit: Min assist Sit to supine: Min assist General bed mobility comments: Min assist to support trunk during transition to sit, assit to help lift right leg only back into the bed when going to supine.  Pt using bed rail for leverage during both transitions.  Transfers Overall transfer level: Needs assistance Equipment used: Rolling walker (2 wheeled) Transfers: Sit to/from Stand Sit to Stand: Min assist General transfer comment: Min assist to support trunk during transitions. Pt leaning posteriorly and to the right.  Ambulation/Gait Ambulation/Gait assistance: Min assist Ambulation Distance (Feet): 80 Feet (with one seated rest break) Assistive device: Rolling walker (2 wheeled) Gait Pattern/deviations: Step-through pattern;Decreased step length - right;Decreased dorsiflexion - right;Decreased weight shift to left;Shuffle;Drifts right/left Gait velocity: decreased Gait velocity interpretation: <1.8 ft/sec, indicative of risk for recurrent falls General Gait Details: Pt listing to the right and trunk leaning to the right.  Pt with decreased progression of right foot foward during gait compared to left foot (functionally making the right leg seem weaker, however, during seated MMT both seemed equally weak).     ADL: ADL Overall ADL's : Needs assistance/impaired Grooming: Wash/dry face;Brushing hair;Applying deodorant;Maximal assistance;Standing Upper  Body Bathing: Moderate assistance;Maximal assistance;Standing Lower Body Bathing: Sit to/from stand;Maximal assistance Upper Body Dressing :  Minimal assistance;Sitting (gown) Lower Body Dressing: Sitting/lateral leans;Moderate assistance (socks) Toilet Transfer: Moderate assistance;Ambulation;Minimal assistance;RW;BSC Functional mobility during ADLs: Moderate assistance;Rolling walker General ADL Comments: Pt with decreased balance at sink requiring Min-Max A. Pt attempting to put deodorant in mouth. Pt with slow processing and difficulty following commands. Pt incontinent of urine when OT arrived, so washed up at sink and changed gown.   Cognition: Cognition Overall Cognitive Status: Impaired/Different from baseline Orientation Level: Oriented to person;Oriented to place;Disoriented to time;Disoriented to situation Cognition Arousal/Alertness: Awake/alert Behavior During Therapy: WFL for tasks assessed/performed;Flat affect Overall Cognitive Status: Impaired/Different from baseline Area of Impairment: Orientation;Attention;Memory;Following commands;Safety/judgement;Awareness;Problem solving Orientation Level: Disoriented to;Time;Situation Current Attention Level: Sustained Memory: Decreased short-term memory Following Commands: Follows one step commands with increased time Safety/Judgement: Decreased awareness of safety;Decreased awareness of deficits Awareness: Intellectual Problem Solving: Slow processing;Difficulty sequencing;Requires verbal cues;Requires tactile cues General Comments: Pt with expressive difficulties.  Does well with one step commands.  Not aware at all of her deficits in balance, strength, which way she is leaning while walking.   Blood pressure 120/73, pulse 80, temperature 97.7 F (36.5 C), temperature source Oral, resp. rate 18, height 5\' 1"  (1.549 m), weight 59.1 kg (130 lb 4.7 oz), SpO2 100.00%. Physical Exam  Eyes: EOM are normal.  Neck: Normal range of  motion. Neck supple. No thyromegaly present.  Cardiovascular: Normal rate and regular rhythm.   Respiratory: Effort normal and breath sounds normal. No respiratory distress.  GI: Soft. Bowel sounds are normal. She exhibits no distension.  Neurological:  Patient is alert and makes good eye contact with examiner. She does exhibit some delay in processing. She was able to provide her name, age and date of birth. She has difficulty geographical information. Follows simple commands. She does display some decreased awareness  Skin:  Craniotomy site with staples intact.    Results for orders placed during the hospital encounter of 06/17/14 (from the past 24 hour(s))  GLUCOSE, CAPILLARY     Status: Abnormal   Collection Time    06/26/14  5:01 PM      Result Value Ref Range   Glucose-Capillary 105 (*) 70 - 99 mg/dL   Comment 1 Notify RN     Comment 2 Documented in Chart    GLUCOSE, CAPILLARY     Status: Abnormal   Collection Time    06/26/14  9:08 PM      Result Value Ref Range   Glucose-Capillary 114 (*) 70 - 99 mg/dL   Comment 1 Notify RN     Comment 2 Documented in Chart    GLUCOSE, CAPILLARY     Status: None   Collection Time    06/27/14  8:05 AM      Result Value Ref Range   Glucose-Capillary 90  70 - 99 mg/dL   Comment 1 Notify RN     Comment 2 Documented in Chart    GLUCOSE, CAPILLARY     Status: Abnormal   Collection Time    06/27/14 11:46 AM      Result Value Ref Range   Glucose-Capillary 181 (*) 70 - 99 mg/dL   No results found.  Assessment/Plan: Diagnosis: left frontal and occipital brain tumors s/p resection 1. Does the need for close, 24 hr/day medical supervision in concert with the patient's rehab needs make it unreasonable for this patient to be served in a less intensive setting? Yes 2. Co-Morbidities requiring supervision/potential complications: bipolar disorder, htn, lung cancer 3. Due to bladder management, bowel management, safety,  skin/wound care, disease  management, medication administration, pain management and patient education, does the patient require 24 hr/day rehab nursing? Yes 4. Does the patient require coordinated care of a physician, rehab nurse, PT (1-2 hrs/day, 5 days/week), OT (1-2 hrs/day, 5 days/week) and SLP (1-2 hrs/day, 5 days/week) to address physical and functional deficits in the context of the above medical diagnosis(es)? Yes Addressing deficits in the following areas: balance, endurance, locomotion, strength, transferring, bowel/bladder control, bathing, dressing, feeding, grooming, toileting, cognition and psychosocial support 5. Can the patient actively participate in an intensive therapy program of at least 3 hrs of therapy per day at least 5 days per week? Yes 6. The potential for patient to make measurable gains while on inpatient rehab is good 7. Anticipated functional outcomes upon discharge from inpatient rehab are supervision  with PT, supervision with OT, supervision with SLP. 8. Estimated rehab length of stay to reach the above functional goals is: 7-10 days 9. Does the patient have adequate social supports to accommodate these discharge functional goals? Yes 10. Anticipated D/C setting: Home 11. Anticipated post D/C treatments: HH therapy and Outpatient therapy 12. Overall Rehab/Functional Prognosis: excellent  RECOMMENDATIONS: This patient's condition is appropriate for continued rehabilitative care in the following setting: CIR Patient has agreed to participate in recommended program. Potentially Note that insurance prior authorization may be required for reimbursement for recommended care.  Comment: Rehab Admissions Coordinator to follow up.  Thanks,  Meredith Staggers, MD, Mellody Drown     06/27/2014

## 2014-06-27 NOTE — Evaluation (Addendum)
Occupational Therapy Re-Evaluation Patient Details Name: [name] MRN: [id number] DOB: 1951/07/07 Today's Date: 06/27/2014    History of Present Illness The patient is a 63 year old female admitted to [facility] on 06/17/14 due to recent h/o weight loss, falling, and decreased mental status. Head CT demonstrated a left frontal lesion. A neurosurgical consultation was requested.  The pt  was dx with left frontal lobe tumor.   Pt is now s/p left frontal crani and left frontal occipital crani throgh separate incision to remove brain tumor on 06/23/14.  Pt with other significant PMHx of bipolar disorder.   Clinical Impression   Pt s/p brain tumor removal. Pt still with apparent cognitive deficits. Brother present during session today. Discussed discharge options for pt. Recommending CIR for rehab prior to d/c and feel pt will benefit. Feel pt will benefit from acute OT to increase independence prior to d/c. Updated goals.     Follow Up Recommendations  CIR;Supervision/Assistance - 24 hour    Equipment Recommendations  3 in 1 bedside comode    Recommendations for Other Services Rehab consult     Precautions / Restrictions Precautions Precautions: Fall Precaution Comments: due to instability in standing.  Restrictions Weight Bearing Restrictions: No      Mobility Bed Mobility Overal bed mobility: Needs Assistance Bed Mobility: Supine to Sit     Supine to sit: Mod assist     General bed mobility comments: assist to scoot to EOB. Cues for technique.  Transfers Overall transfer level: Needs assistance Equipment used: Rolling walker (2 wheeled) Transfers: Sit to/from Stand Sit to Stand: Min assist; Mod assist        General transfer comment: cues for technique.    Balance           Standing balance support: During functional activity Standing balance-Leahy Scale: Poor Standing balance comment: needs assist for balance at sink during ADLs.                             ADL Overall ADL's : Needs assistance/impaired Eating/Feeding: Set up;Sitting;Supervision/ safety   Grooming: Applying deodorant;Standing;Sitting;Moderate assistance;Maximal assistance (Min-Max A for balance at sink)   Upper Body Bathing: Maximal assistance;Standing;Moderate assistance   Lower Body Bathing: Maximal assistance;Sit to/from stand   Upper Body Dressing : Set up;Supervision/safety;Sitting (donned gown)   Lower Body Dressing: Sit to/from stand;Maximal assistance   Toilet Transfer: Minimal assistance;Moderate assistance;Ambulation;RW;BSC           Functional mobility during ADLs: Minimal assistance;Moderate assistance;Rolling walker General ADL Comments:  Pt standing at sink and says "ouch" and urinates on floor. Pt performed ADLs at sink. Pt with decreased balance requiring Min-Max A (cues given for foot placement). Pt attempting to put deodorant in mouth. Pt with apparent cognitive deficits.  Pt requiring cues for ADLs and did not complete grooming task (deodorant) and wanted to sit down.       Vision  Visual fields: No apparent deficits (vision difficult to assess due to decreased cognition)                   Perception     Praxis      Pertinent Vitals/Pain Pain Assessment: No/denies pain     Hand Dominance     Extremity/Trunk Assessment Upper Extremity Assessment Upper Extremity Assessment: RUE deficits/detail RUE Deficits / Details: less than full AROM shoulder flexion-brother reports it was weaker prior to admission-approximately 150 degrees   Lower Extremity Assessment Lower Extremity  Assessment: Defer to PT evaluation       Communication Communication Communication: Expressive difficulties   Cognition Arousal/Alertness: Awake/alert Behavior During Therapy: WFL for tasks assessed/performed;Flat affect Overall Cognitive Status: Impaired/Different from baseline Area of Impairment: Orientation;Attention;Following  commands;Safety/judgement;Problem solving Orientation Level: Disoriented to;Time Current Attention Level: Sustained   Following Commands: Follows one step commands inconsistently Safety/Judgement: Decreased awareness of safety   Problem Solving: Slow processing;Requires verbal cues;Requires tactile cues;Decreased initiation;Difficulty sequencing     General Comments       Exercises       Shoulder Instructions      Home Living Family/patient expects to be discharged to:: Private residence Living Arrangements: Other relatives Available Help at Discharge: Family;Available PRN/intermittently Type of Home: House Home Access: Stairs to enter [employer] of Steps: unsure   Home Layout: One level     Bathroom Shower/Tub: Teacher, early years/pre: Standard     Home Equipment: Environmental consultant - 2 wheels;Wheelchair - manual          Prior Functioning/Environment Level of Independence: Needs assistance  Gait / Transfers Assistance Needed: pt with decreased balance/falls last month or so     Comments: pt typically sponge bathes    OT Diagnosis: Generalized weakness;Cognitive deficits   OT Problem List: Decreased strength;Decreased activity tolerance;Impaired balance (sitting and/or standing);Impaired vision/perception;Decreased cognition;Decreased safety awareness;Decreased knowledge of use of DME or AE;Decreased knowledge of precautions   OT Treatment/Interventions: Self-care/ADL training;DME and/or AE instruction;Therapeutic activities;Cognitive remediation/compensation;Visual/perceptual remediation/compensation;Patient/family education;Balance training    OT Goals(Current goals can be found in the care plan section) Acute Rehab OT Goals Patient Stated Goal: none stated OT Goal Formulation: With family Time For Goal Achievement: 07/04/14 Potential to Achieve Goals: Good ADL Goals Pt Will Perform Grooming: with min guard assist;standing Pt Will Perform Lower  Body Bathing: with min guard assist;sit to/from stand Pt Will Perform Lower Body Dressing: sit to/from stand;with min guard assist Pt Will Transfer to Toilet: with min guard assist;ambulating;bedside commode Pt Will Perform Toileting - Clothing Manipulation and hygiene: with min guard assist;sit to/from stand  OT Frequency: Min 2X/week   Barriers to D/C: Decreased caregiver support          Co-evaluation              End of Session Equipment Utilized During Treatment: Gait belt;Rolling walker Nurse Communication: Other (comment) (chair alarm under pt)  Activity Tolerance: Patient tolerated treatment well Patient left: in chair;with call bell/phone within reach;with chair alarm set;with family/visitor present   Time: 1683-7290 OT Time Calculation (min): 36 min Charges:  OT General Charges $OT Visit: 1 Procedure OT Evaluation $OT Re-eval: 1 Procedure OT Treatments $Self Care/Home Management : 8-22 mins G-CodesBenito Mccreedy OTR/L [phone] 06/27/2014, 2:00 PM

## 2014-06-27 NOTE — Evaluation (Signed)
Clinical/Bedside Swallow Evaluation Patient Details  Name: [name] MRN: [id number] Date of Birth: 1951/02/01  Today's Date: 06/27/2014 Time: 3614-4315 SLP Time Calculation (min): 13 min  Past Medical History:  Past Medical History  Diagnosis Date  . Bipolar disorder   . Cancer     small call lung cancer   Past Surgical History: History reviewed. No pertinent past surgical history. HPI:  The patient is a 63 year old female admitted to [facility] on 06/17/14 due to recent h/o weight loss, falling, and decreased mental status. Head CT demonstrated a left frontal lesion. A neurosurgical consultation was requested.  The pt  was dx with left frontal lobe tumor.   Pt is now s/p left frontal crani and left frontal occipital crani throgh separate incision to remove brain tumor on 06/23/14.  Pt with other significant PMHx of bipolar disorder.    Assessment / Plan / Recommendation Clinical Impression  Patient presents with a functional appearing oropharyngeal swallow without overt indication of aspiration. No f/u SLP needs indicated for swallow however noted both cognitive and language deficits at bedside. Recommend cognitive-linguistic evaluation. MD, please order if agree.     Aspiration Risk  Mild    Diet Recommendation Regular;Thin liquid   Liquid Administration via: Cup;Straw Medication Administration: Whole meds with liquid Supervision: Patient able to self feed Compensations: Slow rate;Small sips/bites Postural Changes and/or Swallow Maneuvers: Seated upright 90 degrees    Other  Recommendations Oral Care Recommendations: Oral care BID   Follow Up Recommendations   (TBD)    Frequency and Duration        Pertinent Vitals/Pain n/a        Swallow Study Prior Functional Status  Type of Home: House Available Help at Discharge: Family;Available PRN/intermittently    General HPI: The patient is a 63 year old female admitted to [facility] on 06/17/14 due to recent h/o weight loss,  falling, and decreased mental status. Head CT demonstrated a left frontal lesion. A neurosurgical consultation was requested.  The pt  was dx with left frontal lobe tumor.   Pt is now s/p left frontal crani and left frontal occipital crani throgh separate incision to remove brain tumor on 06/23/14.  Pt with other significant PMHx of bipolar disorder.  Type of Study: Bedside swallow evaluation Previous Swallow Assessment: none noted Diet Prior to this Study: Regular;Thin liquids Temperature Spikes Noted: No Respiratory Status: Room air History of Recent Intubation: No Behavior/Cognition: Alert;Cooperative;Pleasant mood;Confused;Distractible;Requires cueing;Decreased sustained attention Oral Cavity - Dentition: Edentulous Self-Feeding Abilities: Able to feed self Patient Positioning: Upright in bed Baseline Vocal Quality: Clear Volitional Cough: Strong Volitional Swallow: Able to elicit    Oral/Motor/Sensory Function Overall Oral Motor/Sensory Function: Other (comment) (mild discoordination, otherwise WFL)   Ice Chips Ice chips: Not tested   Thin Liquid Thin Liquid: Within functional limits Presentation: Cup;Self Fed;Straw    Nectar Thick Nectar Thick Liquid: Not tested   Honey Thick Honey Thick Liquid: Not tested   Puree Puree: Within functional limits Presentation: Self Fed;Spoon   Solid   GO    Solid: Within functional limits Presentation: [name], [name] [phone]  [name] [name] 06/27/2014,4:19 PM

## 2014-06-27 NOTE — Progress Notes (Signed)
UR complete.  Emira Eubanks RN, MSN 

## 2014-06-27 NOTE — Progress Notes (Signed)
Rehab Admissions Coordinator Note:  Patient was screened by Retta Diones for appropriateness for an Inpatient Acute Rehab Consult.  At this time, we are recommending Inpatient Rehab consult.  Retta Diones 06/27/2014, 9:48 AM  I can be reached at [phone].

## 2014-06-28 ENCOUNTER — Encounter (HOSPITAL_COMMUNITY): Payer: Self-pay | Admitting: Neurosurgery

## 2014-06-28 ENCOUNTER — Inpatient Hospital Stay (HOSPITAL_COMMUNITY)
Admission: RE | Admit: 2014-06-28 | Discharge: 2014-07-15 | DRG: 055 | Disposition: A | Discharge status: Skilled Nursing Facility | Payer: Medicaid Other | Source: Intra-hospital | Attending: Physical Medicine & Rehabilitation | Admitting: Physical Medicine & Rehabilitation

## 2014-06-28 DIAGNOSIS — D496 Neoplasm of unspecified behavior of brain: Secondary | ICD-10-CM | POA: Diagnosis present

## 2014-06-28 DIAGNOSIS — Z923 Personal history of irradiation: Secondary | ICD-10-CM

## 2014-06-28 DIAGNOSIS — Z85118 Personal history of other malignant neoplasm of bronchus and lung: Secondary | ICD-10-CM | POA: Diagnosis not present

## 2014-06-28 DIAGNOSIS — C7931 Secondary malignant neoplasm of brain: Principal | ICD-10-CM | POA: Diagnosis present

## 2014-06-28 DIAGNOSIS — Z9221 Personal history of antineoplastic chemotherapy: Secondary | ICD-10-CM

## 2014-06-28 DIAGNOSIS — C3490 Malignant neoplasm of unspecified part of unspecified bronchus or lung: Secondary | ICD-10-CM

## 2014-06-28 DIAGNOSIS — I10 Essential (primary) hypertension: Secondary | ICD-10-CM

## 2014-06-28 DIAGNOSIS — I619 Nontraumatic intracerebral hemorrhage, unspecified: Secondary | ICD-10-CM

## 2014-06-28 DIAGNOSIS — E1142 Type 2 diabetes mellitus with diabetic polyneuropathy: Secondary | ICD-10-CM | POA: Diagnosis present

## 2014-06-28 DIAGNOSIS — E119 Type 2 diabetes mellitus without complications: Secondary | ICD-10-CM | POA: Insufficient documentation

## 2014-06-28 DIAGNOSIS — R32 Unspecified urinary incontinence: Secondary | ICD-10-CM | POA: Diagnosis present

## 2014-06-28 DIAGNOSIS — R059 Cough, unspecified: Secondary | ICD-10-CM

## 2014-06-28 DIAGNOSIS — F319 Bipolar disorder, unspecified: Secondary | ICD-10-CM

## 2014-06-28 DIAGNOSIS — C799 Secondary malignant neoplasm of unspecified site: Secondary | ICD-10-CM

## 2014-06-28 DIAGNOSIS — R05 Cough: Secondary | ICD-10-CM

## 2014-06-28 LAB — GLUCOSE, CAPILLARY
GLUCOSE-CAPILLARY: 79 mg/dL (ref 70–99)
Glucose-Capillary: 72 mg/dL (ref 70–99)
Glucose-Capillary: 118 mg/dL — ABNORMAL HIGH (ref 70–99)
Glucose-Capillary: 90 mg/dL (ref 70–99)

## 2014-06-28 MED ORDER — ACETAMINOPHEN 325 MG PO TABS
325.0000 mg | ORAL_TABLET | ORAL | Status: DC | PRN
  Filled 2014-06-28: qty 2

## 2014-06-28 MED ORDER — LEVETIRACETAM 500 MG PO TABS: 500.0000 mg | ORAL_TABLET | Freq: Two times a day (BID) | ORAL | Status: DC

## 2014-06-28 MED ORDER — DEXAMETHASONE 2 MG PO TABS: 2.0000 mg | ORAL_TABLET | Freq: Two times a day (BID) | ORAL | Status: DC

## 2014-06-28 MED ORDER — HYDROCODONE-ACETAMINOPHEN 5-325 MG PO TABS
1.0000 | ORAL_TABLET | ORAL | Status: DC | PRN
  Administered 2014-07-04 – 2014-07-13 (×4): 1 via ORAL
  Filled 2014-06-28 (×4): qty 1

## 2014-06-28 MED ORDER — SORBITOL 70 % SOLN
30.0000 mL | Freq: Every day | Status: DC | PRN
  Administered 2014-06-28 – 2014-07-07 (×3): 30 mL via ORAL
  Filled 2014-06-28 (×5): qty 30

## 2014-06-28 MED ORDER — INSULIN ASPART 100 UNIT/ML ~~LOC~~ SOLN
0.0000 [IU] | Freq: Three times a day (TID) | SUBCUTANEOUS | Status: DC
  Administered 2014-06-30: 2 [IU] via SUBCUTANEOUS
  Administered 2014-07-01 – 2014-07-02 (×2): 3 [IU] via SUBCUTANEOUS
  Administered 2014-07-03 – 2014-07-05 (×2): 2 [IU] via SUBCUTANEOUS
  Administered 2014-07-11: 5 [IU] via SUBCUTANEOUS
  Administered 2014-07-13: 3 [IU] via SUBCUTANEOUS
  Administered 2014-07-15: 2 [IU] via SUBCUTANEOUS

## 2014-06-28 MED ORDER — DOCUSATE SODIUM 100 MG PO CAPS
100.0000 mg | ORAL_CAPSULE | Freq: Two times a day (BID) | ORAL | Status: DC
  Administered 2014-06-28 – 2014-06-29 (×2): 100 mg via ORAL
  Filled 2014-06-28 (×4): qty 1

## 2014-06-28 MED ORDER — PANTOPRAZOLE SODIUM 40 MG PO TBEC
40.0000 mg | DELAYED_RELEASE_TABLET | Freq: Every day | ORAL | Status: DC
  Administered 2014-06-29 – 2014-07-15 (×17): 40 mg via ORAL
  Filled 2014-06-28 (×15): qty 1

## 2014-06-28 MED ORDER — ALUM & MAG HYDROXIDE-SIMETH 200-200-20 MG/5ML PO SUSP: 30.0000 mL | Freq: Four times a day (QID) | ORAL | Status: DC | PRN

## 2014-06-28 MED ORDER — LEVETIRACETAM 500 MG PO TABS
500.0000 mg | ORAL_TABLET | Freq: Two times a day (BID) | ORAL | Status: DC
  Administered 2014-06-28 – 2014-06-29 (×2): 500 mg via ORAL
  Filled 2014-06-28 (×4): qty 1

## 2014-06-28 MED ORDER — TRAZODONE HCL 50 MG PO TABS: 50.0000 mg | ORAL_TABLET | Freq: Every evening | ORAL | Status: DC | PRN

## 2014-06-28 MED ORDER — DEXAMETHASONE 2 MG PO TABS: 2.0000 mg | ORAL_TABLET | Freq: Two times a day (BID) | ORAL | Status: AC

## 2014-06-28 MED ORDER — DEXAMETHASONE 2 MG PO TABS
2.0000 mg | ORAL_TABLET | Freq: Two times a day (BID) | ORAL | Status: DC
  Administered 2014-06-28 – 2014-07-02 (×9): 2 mg via ORAL
  Filled 2014-06-28 (×13): qty 1

## 2014-06-28 MED ORDER — RISPERIDONE 2 MG PO TABS
4.0000 mg | ORAL_TABLET | Freq: Every day | ORAL | Status: DC
  Administered 2014-06-28 – 2014-07-14 (×17): 4 mg via ORAL
  Filled 2014-06-28 (×18): qty 2

## 2014-06-28 MED ORDER — DSS 100 MG PO CAPS: 100.0000 mg | ORAL_CAPSULE | Freq: Two times a day (BID) | ORAL | Status: AC

## 2014-06-28 MED ORDER — PANTOPRAZOLE SODIUM 40 MG PO TBEC: 40.0000 mg | DELAYED_RELEASE_TABLET | Freq: Every day | ORAL | Status: AC

## 2014-06-28 MED ORDER — ONDANSETRON HCL 4 MG/2ML IJ SOLN: 4.0000 mg | Freq: Four times a day (QID) | INTRAMUSCULAR | Status: DC | PRN

## 2014-06-28 MED ORDER — HYDROCODONE-ACETAMINOPHEN 5-325 MG PO TABS: 1.0000 | ORAL_TABLET | ORAL | Status: AC | PRN

## 2014-06-28 MED ORDER — ONDANSETRON HCL 4 MG PO TABS
4.0000 mg | ORAL_TABLET | Freq: Four times a day (QID) | ORAL | Status: DC | PRN
Start: 2014-06-28 — End: 2014-07-15

## 2014-06-28 MED ORDER — METFORMIN HCL 500 MG PO TABS
1000.0000 mg | ORAL_TABLET | Freq: Two times a day (BID) | ORAL | Status: DC
Start: 2014-06-29 — End: 2014-07-01
  Administered 2014-06-29 – 2014-07-01 (×5): 1000 mg via ORAL
  Filled 2014-06-28 (×7): qty 2

## 2014-06-28 NOTE — Progress Notes (Signed)
[name] Re[facility] Admission Coordinator Signed Physical Medicine and Rehabilitation PMR Pre-admission Service date: 06/28/2014 1:25 PM  Related encounter: Admission (Current) from 06/17/2014 in [facility]   PMR Admission Coordinator Pre-Admission Assessment  Patient: [name] is an 63 y.o., female  MRN: [id number]  DOB: 19-Jun-1951  Height: 5\' 1"  (154.9 cm)  Weight: 59.1 kg (130 lb 4.7 oz)  Insurance Information  HMO: PPO: PCP: IPA: 80/20: OTHER:  PRIMARY: Medicaid [place] Access Policy#: [id number] p Subscriber: self  CM Name: Phone#: Fax#:  Pre-Cert#: Employer: not employed  Benefits: Phone #: [phone] Name:  Eff. 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 right-handed female with history of diabetes mellitus, small cell lung cancer initially diagnosed 7 years ago and treated with radiation chemotherapy. Patient lives with her brother and reportedly use a walker prior to admission. Admitted 06/18/2014 with altered mental status and recent falls. Cranial CT scan demonstrated large frontal mass. Workup included CT of chest and abdomen and pelvis which were negative. Attempts were made to get brain MRI that she had some sort of metallic object which caused a lot of artifact on the MRI imaging. If old CT of the head demonstrated large frontal and left parietal-occipital mass. Underwent left frontal craniotomy for gross total resection of frontal brain tumor, left suboccipital craniotomy for gross total resection of left occipital tumor  06/23/2014 per Dr. Arnoldo Morale. Keppra added for seizure prophylaxis. Followup MRI postoperatively 06/24/2014 showing no enhancing marginal tissues seen at the site of resection.  Await oncology followup question plan radiation therapy. Patient remains on Decadron protocol and decreased to 2 mg every 12 hours 06/28/2014. Tolerating a regular diet. Physical and occupational therapy reevaluations completed after craniotomy and noted expressive difficulties and decreasing cognition with recommendations of physical medicine rehabilitation consult. Pt. Admitted to CIR on 06/28/14  Total: 8 NIH   Past Medical History    Past Medical History    Diagnosis  Date    .  Bipolar disorder     .  Cancer       small call lung cancer     Family History  family history is not on file.  Prior Rehab/Hospitalizations: brother denies  Current Medications  Current facility-administered medications:0.9 % NaCl with KCl 20 mEq/ L infusion, , Intravenous, Continuous, Newman Pies, MD, Last Rate: 75 mL/hr at 06/27/14 0259; acetaminophen (TYLENOL) suppository 650 mg, 650 mg, Rectal, Q4H PRN, Newman Pies, MD; acetaminophen (TYLENOL) tablet 650 mg, 650 mg, Oral, Q4H PRN, Newman Pies, MD, 650 mg at 06/25/14 0222  antiseptic oral rinse (CPC / CETYLPYRIDINIUM CHLORIDE 0.05%) solution 7 mL, 7 mL, Mouth Rinse, BID, Newman Pies, MD, 7 mL at 06/28/14 0949; dexamethasone (DECADRON) tablet 2 mg, 2 mg, Oral, Q12H, Newman Pies, MD; docusate sodium (COLACE) capsule 100 mg, 100 mg, Oral, BID, Newman Pies, MD, 100 mg at 06/28/14 0949; gadobenate dimeglumine (MULTIHANCE) injection 10 mL, 10 mL, Intravenous, Once, Medication Radiologist, MD  HYDROcodone-acetaminophen (NORCO/VICODIN) 5-325 MG per tablet 1 tablet, 1  tablet, Oral, Q4H PRN, Newman Pies, MD; insulin aspart (novoLOG) injection 0-15 Units, 0-15 Units, Subcutaneous, TID WC, Erline Levine, MD, 2 Units at 06/27/14 1741; labetalol (NORMODYNE,TRANDATE) injection 10-40  mg, 10-40 mg, Intravenous, Q10 min PRN, Newman Pies, MD, 10 mg at 06/24/14 1408  lactated ringers bolus 1,000 mL, 1,000 mL, Intravenous, Once, Newman Pies, MD; levETIRAcetam (KEPPRA) tablet 500 mg, 500 mg, Oral, BID, Newman Pies, MD, 500 mg at 06/28/14 6433; metFORMIN (GLUCOPHAGE) tablet 1,000 mg, 1,000 mg, Oral, BID WC, Newman Pies, MD, 1,000 mg at 06/28/14 0848; morphine 2 MG/ML injection 1-2 mg, 1-2 mg, Intravenous, Q2H PRN, Newman Pies, MD, 2 mg at 06/24/14 0035  ondansetron [facility]) injection 4 mg, 4 mg, Intravenous, Q4H PRN, Newman Pies, MD; ondansetron [facility]) tablet 4 mg, 4 mg, Oral, Q4H PRN, Newman Pies, MD; pantoprazole (PROTONIX) EC tablet 40 mg, 40 mg, Oral, Q1200, Newman Pies, MD, 40 mg at 06/28/14 1206; promethazine (PHENERGAN) tablet 12.5-25 mg, 12.5-25 mg, Oral, Q4H PRN, Newman Pies, MD  risperiDONE (RISPERDAL) tablet 4 mg, 4 mg, Oral, QHS, Newman Pies, MD, 4 mg at 06/27/14 2246  Patients Current Diet: regular diet with thins  Precautions / Restrictions  Precautions  Precautions: Fall  Precaution Comments: due to instability in standing.  Restrictions  Weight Bearing Restrictions: No  Prior Activity Level  Community (5-7x/wk): Per pt's brother, pt. and brother goe out daily to [employer] for their lunch meal. Brother states this may be their only mela for the day, and at other times they have food to eat a second meal of the day  Development worker, international aid / [place] Devices/Equipment: [place]: Environmental consultant - 2 wheels;Wheelchair - manual  Prior Functional Level  Prior Function  Level of Independence: Needs assistance  Gait / Transfers Assistance Needed: pt with decreased balance/falls last month or so  Comments: pt typically sponge bathes  Current Functional Level    Cognition  Overall Cognitive Status: Impaired/Different from baseline  Current Attention Level: Sustained  Orientation Level: Oriented to  person;Disoriented to situation;Oriented to time;Disoriented to place  Following Commands: Follows one step commands inconsistently  Safety/Judgement: Decreased awareness of safety  General Comments: Pt with expressive difficulties. Does well with one step commands. Not aware at all of her deficits in balance, strength, which way she is leaning while walking.    Extremity Assessment  (includes Sensation/Coordination)      ADLs  Overall ADL's : Needs assistance/impaired  Eating/Feeding: Set up;Sitting;Supervision/ safety  Grooming: Applying deodorant;Standing;Sitting;Moderate assistance;Maximal assistance (Min-Max A for balance at sink)  Upper Body Bathing: Maximal assistance;Standing;Moderate assistance  Lower Body Bathing: Maximal assistance;Sit to/from stand  Upper Body Dressing : Set up;Supervision/safety;Sitting (donned gown)  Lower Body Dressing: Sit to/from stand;Maximal assistance  Toilet Transfer: Minimal assistance;Moderate assistance;Ambulation;RW;BSC  Functional mobility during ADLs: Minimal assistance;Moderate assistance;Rolling walker  General ADL Comments: Pt standing at sink and says "ouch" and urinates on floor. Pt performed ADLs at sink. Pt with decreased balance requiring Min-Max A (cues given for foot placement). Pt attempting to put deodorant in mouth. Pt with apparent cognitive deficits.    Mobility  Overal bed mobility: Needs Assistance  Bed Mobility: Supine to Sit  Rolling: Min assist  Supine to sit: Mod assist  Sit to supine: Min assist  General bed mobility comments: assist to scoot to EOB. Cues for technique.    Transfers  Overall transfer level: Needs assistance  Equipment used: Rolling walker (2 wheeled)  Transfers: Sit to/from Stand  Sit to Stand: Min  assist;Mod assist  General transfer comment: cues for technique.    Ambulation / Gait / Stairs / Wheelchair Mobility  Ambulation/Gait  Ambulation/Gait assistance: Fish farm manager (Feet): 80 Feet  (with one seated rest break)  Assistive device: Rolling walker (2 wheeled)  Gait Pattern/deviations: Step-through pattern;Decreased step length - right;Decreased dorsiflexion - right;Decreased weight shift to left;Shuffle;Drifts right/left  Gait velocity: decreased  Gait velocity interpretation: <1.8 ft/sec, indicative of risk for recurrent falls  General Gait Details: Pt listing to the right and trunk leaning to the right. Pt with decreased progression of right foot foward during gait compared to left foot (functionally making the right leg seem weaker, however, during seated MMT both seemed equally weak).    Posture / Balance  Dynamic Sitting Balance  Sitting balance - Comments: LOB posteriorly and when asked, "which way are you leaning" she stated, "forward, I think"    Special needs/care consideration  BiPAP/CPAP no  Continuous Drip IV no  Skin scalp incisions healing and open to air  Bowel mgmt:last documented BM 10/16 per RN; RN has requested that MD address ; pt. incontinent  Bladder mgmt incontinent  Diabetic mgmt ACHS    Previous Home Environment  Living Arrangements: Other relatives  Available Help at Discharge: (brother thinks family can provide 24 hr supervision)  Type of Home: House  Home Layout: One level  Home Access: Stairs to enter  [employer] of Steps: 5-6  Bathroom Shower/Tub: Administrator, Civil Service: [place]: No  Discharge Living Setting  Plans for Discharge Living Setting: Patient's home  Type of Home at Discharge: House  Discharge Home Layout: One level  Discharge Home Access: Stairs to enter  Entrance Stairs-Rails: Right  Entrance Stairs-Number of Steps: 5-6  Discharge Bathroom Shower/Tub: Tub/shower unit  Discharge Bathroom Toilet: Standard  Discharge Bathroom Accessibility: Yes  How Accessible: Accessible via walker;Other (comment) (brother states bathroom small for RW)  Does the patient have any problems obtaining  your medications?: Yes (Describe) (financial)  Social/Family/Support Systems  Contact Information: brother Nurse, learning disability  Anticipated Caregiver: Elsie Ra, brother and Museum/gallery conservator (Phillip's adult granddaughter)  Anticipated Caregiver's Contact Information: Doren Custard [phone]  Ability/Limitations of Caregiver: Doren Custard is on disability for his back  Caregiver Availability: 24/7 (per Doren Custard, will have 24 hour (S))  Discharge Plan Discussed with Primary Caregiver: Yes  Is Caregiver In Agreement with Plan?: Yes  Does Caregiver/Family have Issues with Lodging/Transportation while Pt is in Rehab?: No  Goals/Additional Needs  Patient/Family Goal for Rehab: supervision for PT/OT/SLP  Expected length of stay: 7-10 days  Cultural Considerations: none  Equipment Needs: TBD; brother states tub bench in the homecurrently  Special Service Needs: TBD; per Dr. Antonieta Pert note on 06/28/14, pt. Will likely need radiation to brain and is contacting [place]. For input.  Pt/Family Agrees to Admission and willing to participate: Yes  Program Orientation Provided & Reviewed with Pt/Caregiver Including Roles & Responsibilities: Yes  Decrease burden of Care through IP rehab admission: no  Possible need for SNF placement upon discharge: Not anticipated  Patient Condition: This patient's condition remains as documented in the consult dated 06/27/14, in which the Rehabilitation Physician determined and documented that the patient's condition is appropriate for intensive rehabilitative care in an inpatient rehabilitation facility. Will admit to inpatient rehab today.  Preadmission Screen Completed By: [name] PT , 06/28/2014 1:25 PM  ______________________________________________________________________  Discussed status with Dr. Naaman Plummer on 06/28/14 at 1334 and received telephone approval for admission today.  Admission Coordinator: Domenick Bookbinder time [id number] 06/28/14    Cosigned by: Meredith Staggers, MD [06/28/2014 2:25 PM]

## 2014-06-28 NOTE — H&P (Signed)
Physical Medicine and Rehabilitation Admission H&P    Chief Complaint  Patient presents with  . Fall  : HPI: [name] is a 63 y.o. right-handed female with history of diabetes mellitus, small cell lung cancer initially diagnosed 7 years ago and treated with radiation chemotherapy. Patient lives with her brother and reportedly use a walker prior to admission. Admitted 06/18/2014 with altered mental status and recent falls. Cranial CT scan demonstrated large frontal mass. Workup included CT of chest and abdomen and pelvis which were negative. Attempts were made to get brain MRI that she had some sort of metallic object which caused a lot of artifact on the MRI imaging. If old CT of the head demonstrated large frontal and left parietal-occipital mass. Underwent left frontal craniotomy for gross total resection of frontal brain tumor, left suboccipital craniotomy for gross total resection of left occipital tumor 06/23/2014 per Dr. Arnoldo Morale. Keppra added for seizure prophylaxis. Followup MRI postoperatively 06/24/2014 showing no enhancing marginal tissues seen at the site of resection. Await oncology followup question plan radiation therapy. Patient remains on Decadron protocol and decreased to 2 mg every 12 hours 06/28/2014. Tolerating a regular diet. Physical and occupational therapy reevaluations completed after craniotomy and noted expressive difficulties and decreasing cognition with recommendations of physical medicine rehabilitation consult   ROS Review of Systems  Musculoskeletal: Positive for falls.  Neurological: Positive for headaches.  Psychiatric/Behavioral:  Bipolar disorder  All other systems reviewed and are negative  Past Medical History  Diagnosis Date  . Bipolar disorder   . Cancer     small call lung cancer   History reviewed. No pertinent past surgical history. History reviewed. No pertinent family history. Social History:  reports that she has been smoking.  She  does not have any smokeless tobacco history on file. She reports that she does not drink alcohol or use illicit drugs. Allergies: No Known Allergies No prescriptions prior to admission    Home: Home Living Family/patient expects to be discharged to:: Private residence Living Arrangements: Other relatives Available Help at Discharge: Family;Available PRN/intermittently Type of Home: House Home Access: Stairs to enter [employer] of Steps: unsure Home Layout: One level Home Equipment: Walker - 2 wheels;Wheelchair - manual   Functional History: Prior Function Level of Independence: Needs assistance Gait / Transfers Assistance Needed: pt with decreased balance/falls last month or so Comments: pt typically sponge bathes  Functional Status:  Mobility: Bed Mobility Overal bed mobility: Needs Assistance Bed Mobility: Supine to Sit Rolling: Min assist Supine to sit: Mod assist Sit to supine: Min assist General bed mobility comments: assist to scoot to EOB. Cues for technique. Transfers Overall transfer level: Needs assistance Equipment used: Rolling walker (2 wheeled) Transfers: Sit to/from Stand Sit to Stand: Min assist;Mod assist General transfer comment: cues for technique. Ambulation/Gait Ambulation/Gait assistance: Min assist Ambulation Distance (Feet): 80 Feet (with one seated rest break) Assistive device: Rolling walker (2 wheeled) Gait Pattern/deviations: Step-through pattern;Decreased step length - right;Decreased dorsiflexion - right;Decreased weight shift to left;Shuffle;Drifts right/left Gait velocity: decreased Gait velocity interpretation: <1.8 ft/sec, indicative of risk for recurrent falls General Gait Details: Pt listing to the right and trunk leaning to the right.  Pt with decreased progression of right foot foward during gait compared to left foot (functionally making the right leg seem weaker, however, during seated MMT both seemed equally weak).      ADL: ADL Overall ADL's : Needs assistance/impaired Eating/Feeding: Set up;Sitting;Supervision/ safety Grooming: Applying deodorant;Standing;Sitting;Moderate assistance;Maximal assistance (Min-Max A  for balance at sink) Upper Body Bathing: Maximal assistance;Standing;Moderate assistance Lower Body Bathing: Maximal assistance;Sit to/from stand Upper Body Dressing : Set up;Supervision/safety;Sitting (donned gown) Lower Body Dressing: Sit to/from stand;Maximal assistance Toilet Transfer: Minimal assistance;Moderate assistance;Ambulation;RW;BSC Functional mobility during ADLs: Minimal assistance;Moderate assistance;Rolling walker General ADL Comments:  Pt standing at sink and says "ouch" and urinates on floor. Pt performed ADLs at sink. Pt with decreased balance requiring Min-Max A (cues given for foot placement). Pt attempting to put deodorant in mouth. Pt with apparent cognitive deficits.   Cognition: Cognition Overall Cognitive Status: Impaired/Different from baseline Orientation Level: Oriented to person;Oriented to place;Oriented to situation;Disoriented to time Cognition Arousal/Alertness: Awake/alert Behavior During Therapy: WFL for tasks assessed/performed;Flat affect Overall Cognitive Status: Impaired/Different from baseline Area of Impairment: Orientation;Attention;Following commands;Safety/judgement;Problem solving Orientation Level: Disoriented to;Time Current Attention Level: Sustained Memory: Decreased short-term memory Following Commands: Follows one step commands inconsistently Safety/Judgement: Decreased awareness of safety Awareness: Intellectual Problem Solving: Slow processing;Requires verbal cues;Requires tactile cues;Decreased initiation;Difficulty sequencing General Comments: Pt with expressive difficulties.  Does well with one step commands.  Not aware at all of her deficits in balance, strength, which way she is leaning while walking.   Physical Exam: Blood  pressure 152/71, pulse 78, temperature 97.7 F (36.5 C), temperature source Oral, resp. rate 18, height 5\' 1"  (1.549 m), weight 59.1 kg (130 lb 4.7 oz), SpO2 98.00%. Physical Exam General: no distress. Head shaven on left Eyes: EOM are normal HEENT: edentulous, oral mucosa pink/moist.  Neck: Normal range of motion. Neck supple. No thyromegaly present.  Cardiovascular: Normal rate and regular rhythm. Systolic murmur Respiratory: Effort normal and breath sounds normal. No respiratory distress.  GI: Soft. Bowel sounds are normal. She exhibits no distension.  Neurological:  Patient is alert and makes good eye contact with examiner. She does exhibit some delay in processing, better with cueing. She was able to provide her name, age and date of birth. She has difficulty geographical information. Sometimes echolalic.  Follows simple commands. Visual acuity fair. Seems to scan to all visual fields.  Strength 4/5 bilateral deltoid, bicep, tricep, HI. LE: 3/5 HF, 4- KE and 4- to 4/5 with ADF/APF. Sensation? Diminished RLE----inconsistent. DTR's 1+. Skin:  Craniotomy site with staples intact  Results for orders placed during the hospital encounter of 06/17/14 (from the past 48 hour(s))  GLUCOSE, CAPILLARY     Status: Abnormal   Collection Time    06/26/14 11:54 AM      Result Value Ref Range   Glucose-Capillary 133 (*) 70 - 99 mg/dL  GLUCOSE, CAPILLARY     Status: Abnormal   Collection Time    06/26/14  5:01 PM      Result Value Ref Range   Glucose-Capillary 105 (*) 70 - 99 mg/dL   Comment 1 Notify RN     Comment 2 Documented in Chart    GLUCOSE, CAPILLARY     Status: Abnormal   Collection Time    06/26/14  9:08 PM      Result Value Ref Range   Glucose-Capillary 114 (*) 70 - 99 mg/dL   Comment 1 Notify RN     Comment 2 Documented in Chart    GLUCOSE, CAPILLARY     Status: None   Collection Time    06/27/14  8:05 AM      Result Value Ref Range   Glucose-Capillary 90  70 - 99 mg/dL    Comment 1 Notify RN     Comment 2 Documented in Chart  GLUCOSE, CAPILLARY     Status: Abnormal   Collection Time    06/27/14 11:46 AM      Result Value Ref Range   Glucose-Capillary 181 (*) 70 - 99 mg/dL  GLUCOSE, CAPILLARY     Status: Abnormal   Collection Time    06/27/14  5:12 PM      Result Value Ref Range   Glucose-Capillary 147 (*) 70 - 99 mg/dL  GLUCOSE, CAPILLARY     Status: Abnormal   Collection Time    06/27/14  8:56 PM      Result Value Ref Range   Glucose-Capillary 186 (*) 70 - 99 mg/dL  GLUCOSE, CAPILLARY     Status: None   Collection Time    06/28/14  6:41 AM      Result Value Ref Range   Glucose-Capillary 72  70 - 99 mg/dL   Comment 1 Notify RN     No results found.     Medical Problem List and Plan: 1. 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 2.  DVT Prophylaxis/Anticoagulation: SCDs. Monitor for any signs of DVT 3. Pain Management: Hydrocodone as needed. Monitor with increased mobility 4. Mood/bipolar disorder: Risperdal 4 mg each bedtime. 5. Neuropsych: This patient is not capable of making decisions on her own behalf. 6. Skin/Wound Care: Routine skin checks monitor craniotomy incision 7. Fluids/Electrolytes/Nutrition: Followup chemistries were strict I and O. Provide nutritional supplements as needed 8. Seizure prophylaxis. Keppra 500 mg twice a day 9. Diabetes mellitus. Glucophage 1000 mg twice a day. Check blood sugars a.c. and at bedtime. Monitor closely while on Decadron taper.    Post Admission Physician Evaluation: 1. Functional deficits secondary  to metastatic lung ca to the left frontal and occipital brain, s/p resection.. 2. Patient is admitted to receive collaborative, interdisciplinary care between the physiatrist, rehab nursing staff, and therapy team. 3. Patient's level of medical complexity and substantial therapy needs in context of that medical  necessity cannot be provided at a lesser intensity of care such as a SNF. 4. Patient has experienced substantial functional loss from his/her baseline which was documented above under the "Functional History" and "Functional Status" headings.  Judging by the patient's diagnosis, physical exam, and functional history, the patient has potential for functional progress which will result in measurable gains while on inpatient rehab.  These gains will be of substantial and practical use upon discharge  in facilitating mobility and self-care at the household level. 5. Physiatrist will provide 24 hour management of medical needs as well as oversight of the therapy plan/treatment and provide guidance as appropriate regarding the interaction of the two. 6. 24 hour rehab nursing will assist with bladder management, bowel management, safety, skin/wound care, disease management, medication administration, pain management and patient education  and help integrate therapy concepts, techniques,education, etc. 7. PT will assess and treat for/with: Lower extremity strength, range of motion, stamina, balance, functional mobility, safety, adaptive techniques and equipment, NMR, cognitive perceptual awareness, family education, pain mgt.   Goals are: supervision. 8. OT will assess and treat for/with: ADL's, functional mobility, safety, upper extremity strength, adaptive techniques and equipment, NMR, cognitive perceptual awareness, education, leisure awareness, pain control.   Goals are: supervision. Therapy may proceed with showering this patient. 9. SLP will assess and treat for/with: cognition, communication.  Goals are: supervision. 10. Case Management and Social Worker will assess and treat for psychological issues and discharge planning. 11. Team conference will be  held weekly to assess progress toward goals and to determine barriers to discharge. 12. Patient will receive at least 3 hours of therapy per day at least 5  days per week. 13. ELOS: 10-11 days       14. Prognosis:  excellent     Meredith Staggers, MD, [place] [facility] 06/28/2014   06/28/2014

## 2014-06-28 NOTE — Interval H&P Note (Signed)
[name] was admitted today to Inpatient Rehabilitation with the diagnosis of metastatic lung cancer to the brain.  The patient's history has been reviewed, patient examined, and there is no change in status.  Patient continues to be appropriate for intensive inpatient rehabilitation.  I have reviewed the patient's chart and labs.  Questions were answered to the patient's satisfaction.  Raymond Bhardwaj T 06/28/2014, 8:31 PM

## 2014-06-28 NOTE — Progress Notes (Signed)
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  One would have to think that she has systemic disease somewhere. I think that a PET scan will help [place] with this but we cannot do this as an inpatient.    She will need XRT to the brain.  I will let Rad Onc know this.  Looks like she is going to Inpatient Rehab.  I will continue to follow along.    I would imagine that her continued smoking was the likely "culprit."  Pete E.

## 2014-06-28 NOTE — PMR Pre-admission (Signed)
PMR Admission Coordinator Pre-Admission Assessment  Patient: [name] is an 63 y.o., female MRN: [id number] DOB: 31-Mar-1951 Height: 5\' 1"  (154.9 cm) Weight: 59.1 kg (130 lb 4.7 oz)              Insurance Information HMO:     PPO:      PCP:      IPA:      80/20:      OTHER:  PRIMARY: Medicaid [place] [employer]      Policy#: [id number] p       Subscriber:  self CM Name:       Phone#:      Fax#:  Pre-Cert#:       Employer: not employed Benefits:  Phone #: [phone]     Name:  Eff. Date: 06/17/14      Deduct:       Out of Pocket Max:       Life Max:  CIR:   covered    SNF:  Outpatient:      Co-Pay:  Home Health:       Co-Pay:  DME:      Co-Pay:  Providers:      Emergency Contact Information Contact Information   Name Relation Home Work Mobile   BUMGARDNER,PHILLIP brother   [phone]     Current Medical History  Patient Admitting Diagnosis: left frontal and occipital brain tumors s/p resection  History of Present Illness: [name] is a 63 y.o. right-handed female with history of diabetes mellitus, small cell lung cancer initially diagnosed 7 years ago and treated with radiation chemotherapy. Patient lives with her brother and reportedly use a walker prior to admission. Admitted 06/18/2014 with altered mental status and recent falls. Cranial CT scan demonstrated large frontal mass. Workup included CT of chest and abdomen and pelvis which were negative. Attempts were made to get brain MRI that she had some sort of metallic object which caused a lot of artifact on the MRI imaging. If old CT of the head demonstrated large frontal and left parietal-occipital mass. Underwent left frontal craniotomy for gross total resection of frontal brain tumor, left suboccipital craniotomy for gross total resection of left occipital tumor 06/23/2014 per Dr. Arnoldo Morale. Keppra added for seizure prophylaxis. Followup MRI postoperatively 06/24/2014 showing no enhancing marginal tissues seen at the site of  resection.  Await oncology followup question plan radiation therapy. Patient remains on Decadron protocol and decreased to 2 mg every 12 hours 06/28/2014. Tolerating a regular diet. Physical and occupational therapy reevaluations completed after craniotomy and noted expressive difficulties and decreasing cognition with recommendations of physical medicine rehabilitation consult.  Pt. Admitted to CIR on 06/28/14  Total: 8 NIH     Past Medical History  Past Medical History  Diagnosis Date  . Bipolar disorder   . Cancer     small call lung cancer    Family History  family history is not on file.  Prior Rehab/Hospitalizations: brother denies   Current Medications  Current facility-administered medications:0.9 % NaCl with KCl 20 mEq/ L  infusion, , Intravenous, Continuous, Newman Pies, MD, Last Rate: 75 mL/hr at 06/27/14 0259;  acetaminophen (TYLENOL) suppository 650 mg, 650 mg, Rectal, Q4H PRN, Newman Pies, MD;  acetaminophen (TYLENOL) tablet 650 mg, 650 mg, Oral, Q4H PRN, Newman Pies, MD, 650 mg at 06/25/14 0222 antiseptic oral rinse (CPC / CETYLPYRIDINIUM CHLORIDE 0.05%) solution 7 mL, 7 mL, Mouth Rinse, BID, Newman Pies, MD, 7 mL at 06/28/14 0949;  dexamethasone (DECADRON) tablet 2 mg,  2 mg, Oral, Q12H, Newman Pies, MD;  docusate sodium (COLACE) capsule 100 mg, 100 mg, Oral, BID, Newman Pies, MD, 100 mg at 06/28/14 6010;  gadobenate dimeglumine (MULTIHANCE) injection 10 mL, 10 mL, Intravenous, Once, Medication Radiologist, MD HYDROcodone-acetaminophen (NORCO/VICODIN) 5-325 MG per tablet 1 tablet, 1 tablet, Oral, Q4H PRN, Newman Pies, MD;  insulin aspart (novoLOG) injection 0-15 Units, 0-15 Units, Subcutaneous, TID WC, Erline Levine, MD, 2 Units at 06/27/14 1741;  labetalol (NORMODYNE,TRANDATE) injection 10-40 mg, 10-40 mg, Intravenous, Q10 min PRN, Newman Pies, MD, 10 mg at 06/24/14 1408 lactated ringers bolus 1,000 mL, 1,000 mL, Intravenous, Once, Newman Pies, MD;  levETIRAcetam (KEPPRA) tablet 500 mg, 500 mg, Oral, BID, Newman Pies, MD, 500 mg at 06/28/14 9323;  metFORMIN (GLUCOPHAGE) tablet 1,000 mg, 1,000 mg, Oral, BID WC, Newman Pies, MD, 1,000 mg at 06/28/14 0848;  morphine 2 MG/ML injection 1-2 mg, 1-2 mg, Intravenous, Q2H PRN, Newman Pies, MD, 2 mg at 06/24/14 0035 ondansetron [facility]) injection 4 mg, 4 mg, Intravenous, Q4H PRN, Newman Pies, MD;  ondansetron [facility]) tablet 4 mg, 4 mg, Oral, Q4H PRN, Newman Pies, MD;  pantoprazole (PROTONIX) EC tablet 40 mg, 40 mg, Oral, Q1200, Newman Pies, MD, 40 mg at 06/28/14 1206;  promethazine (PHENERGAN) tablet 12.5-25 mg, 12.5-25 mg, Oral, Q4H PRN, Newman Pies, MD risperiDONE (RISPERDAL) tablet 4 mg, 4 mg, Oral, QHS, Newman Pies, MD, 4 mg at 06/27/14 2246  Patients Current Diet: regular diet with thins  Precautions / Restrictions Precautions Precautions: Fall Precaution Comments: due to instability in standing.  Restrictions Weight Bearing Restrictions: No   Prior Activity Level Community (5-7x/wk): Per pt's brother, pt. and brother goe out daily to [employer] for their lunch meal.  Brother states this may be their only mela for the day, and at other times they have food to eat a second meal of the day Development worker, international aid / [place] Devices/Equipment: Porter: Environmental consultant - 2 wheels;Wheelchair - manual  Prior Functional Level Prior Function Level of Independence: Needs assistance Gait / Transfers Assistance Needed: pt with decreased balance/falls last month or so Comments: pt typically sponge bathes  Current Functional Level Cognition  Overall Cognitive Status: Impaired/Different from baseline Current Attention Level: Sustained Orientation Level: Oriented to person;Disoriented to situation;Oriented to time;Disoriented to place Following Commands: Follows one step commands inconsistently Safety/Judgement: Decreased  awareness of safety General Comments: Pt with expressive difficulties.  Does well with one step commands.  Not aware at all of her deficits in balance, strength, which way she is leaning while walking.     Extremity Assessment (includes Sensation/Coordination)          ADLs  Overall ADL's : Needs assistance/impaired Eating/Feeding: Set up;Sitting;Supervision/ safety Grooming: Applying deodorant;Standing;Sitting;Moderate assistance;Maximal assistance (Min-Max A for balance at sink) Upper Body Bathing: Maximal assistance;Standing;Moderate assistance Lower Body Bathing: Maximal assistance;Sit to/from stand Upper Body Dressing : Set up;Supervision/safety;Sitting (donned gown) Lower Body Dressing: Sit to/from stand;Maximal assistance Toilet Transfer: Minimal assistance;Moderate assistance;Ambulation;RW;BSC Functional mobility during ADLs: Minimal assistance;Moderate assistance;Rolling walker General ADL Comments:  Pt standing at sink and says "ouch" and urinates on floor. Pt performed ADLs at sink. Pt with decreased balance requiring Min-Max A (cues given for foot placement). Pt attempting to put deodorant in mouth. Pt with apparent cognitive deficits.     Mobility  Overal bed mobility: Needs Assistance Bed Mobility: Supine to Sit Rolling: Min assist Supine to sit: Mod assist Sit to supine: Min assist General bed mobility comments: assist to scoot to EOB.  Cues for technique.    Transfers  Overall transfer level: Needs assistance Equipment used: Rolling walker (2 wheeled) Transfers: Sit to/from Stand Sit to Stand: Min assist;Mod assist General transfer comment: cues for technique.    Ambulation / Gait / Stairs / Wheelchair Mobility  Ambulation/Gait Ambulation/Gait assistance: Museum/gallery curator (Feet): 80 Feet (with one seated rest break) Assistive device: Rolling walker (2 wheeled) Gait Pattern/deviations: Step-through pattern;Decreased step length - right;Decreased  dorsiflexion - right;Decreased weight shift to left;Shuffle;Drifts right/left Gait velocity: decreased Gait velocity interpretation: <1.8 ft/sec, indicative of risk for recurrent falls General Gait Details: Pt listing to the right and trunk leaning to the right.  Pt with decreased progression of right foot foward during gait compared to left foot (functionally making the right leg seem weaker, however, during seated MMT both seemed equally weak).     Posture / Balance Dynamic Sitting Balance Sitting balance - Comments: LOB posteriorly and when asked, "which way are you leaning" she stated, "forward, I think"    Special needs/care consideration BiPAP/CPAP  no Continuous Drip IV  no       Skin scalp incisions healing and open to air                           Bowel mgmt:last documented BM 10/16 per RN; RN has requested that MD address ; pt. incontinent Bladder mgmt incontinent Diabetic mgmt  [facility]     Previous Home Environment Living Arrangements: Other relatives Available Help at Discharge:  (brother thinks family can provide 24 hr supervision) Type of Home: House Home Layout: One level Home Access: Stairs to enter [employer] of Steps: 5-6 Bathroom Shower/Tub: Chiropodist: [place]: No  Discharge Living Setting Plans for Discharge Living Setting: Patient's home Type of Home at Discharge: House Discharge Home Layout: One level Discharge Home Access: Stairs to enter Entrance Stairs-Rails: Right Entrance Stairs-Number of Steps: 5-6 Discharge Bathroom Shower/Tub: Tub/shower unit Discharge Bathroom Toilet: Standard Discharge Bathroom Accessibility: Yes How Accessible: Accessible via walker;Other (comment) (brother states bathroom small for RW) Does the patient have any problems obtaining your medications?: Yes (Describe) (financial)  Social/Family/Support Systems Contact Information: brother Nurse, learning disability  Anticipated  Caregiver: Elsie Ra, brother and Museum/gallery conservator (Phillip's adult granddaughter) Anticipated Caregiver's Contact Information: Doren Custard [phone] Ability/Limitations of Caregiver: Doren Custard is on disability for his back Caregiver Availability: 24/7 (per Doren Custard, will have 24 hour (S)) Discharge Plan Discussed with Primary Caregiver: Yes Is Caregiver In Agreement with Plan?: Yes Does Caregiver/Family have Issues with Lodging/Transportation while Pt is in Rehab?: No    Goals/Additional Needs Patient/Family Goal for Rehab: supervision for PT/OT/SLP Expected length of stay: 7-10 days Cultural Considerations: none Equipment Needs: TBD; brother states tub bench in the homecurrently Special Service Needs: TBD; per Dr. Antonieta Pert note on 06/28/14, pt. Will likely need radiation to brain and is contacting [place]. For input. Pt/Family Agrees to Admission and willing to participate: Yes Program Orientation Provided & Reviewed with Pt/Caregiver Including Roles  & Responsibilities: Yes   Decrease burden of Care through IP rehab admission: no   Possible need for SNF placement upon discharge:  Not anticipated   Patient Condition: This patient's condition remains as documented in the consult dated 06/27/14, in which the Rehabilitation Physician determined and documented that the patient's condition is appropriate for intensive rehabilitative care in an inpatient rehabilitation facility. Will admit to inpatient rehab today.  Preadmission Screen Completed By:  Gerlean Ren PT ,  06/28/2014 1:25 PM ______________________________________________________________________   Discussed status with Dr.  Naaman Plummer on 06/28/14 at  1334  and received telephone approval for admission today.  Admission Coordinator:  Domenick Bookbinder time 1624 Sudie Grumbling 06/28/14

## 2014-06-28 NOTE — H&P (View-Only) (Signed)
Physical Medicine and Rehabilitation Admission H&P    Chief Complaint  Patient presents with  . Fall  : HPI: [name] is a 63 y.o. right-handed female with history of diabetes mellitus, small cell lung cancer initially diagnosed 7 years ago and treated with radiation chemotherapy. Patient lives with her brother and reportedly use a walker prior to admission. Admitted 06/18/2014 with altered mental status and recent falls. Cranial CT scan demonstrated large frontal mass. Workup included CT of chest and abdomen and pelvis which were negative. Attempts were made to get brain MRI that she had some sort of metallic object which caused a lot of artifact on the MRI imaging. If old CT of the head demonstrated large frontal and left parietal-occipital mass. Underwent left frontal craniotomy for gross total resection of frontal brain tumor, left suboccipital craniotomy for gross total resection of left occipital tumor 06/23/2014 per Dr. Arnoldo Morale. Keppra added for seizure prophylaxis. Followup MRI postoperatively 06/24/2014 showing no enhancing marginal tissues seen at the site of resection. Await oncology followup question plan radiation therapy. Patient remains on Decadron protocol and decreased to 2 mg every 12 hours 06/28/2014. Tolerating a regular diet. Physical and occupational therapy reevaluations completed after craniotomy and noted expressive difficulties and decreasing cognition with recommendations of physical medicine rehabilitation consult   ROS Review of Systems  Musculoskeletal: Positive for falls.  Neurological: Positive for headaches.  Psychiatric/Behavioral:  Bipolar disorder  All other systems reviewed and are negative  Past Medical History  Diagnosis Date  . Bipolar disorder   . Cancer     small call lung cancer   History reviewed. No pertinent past surgical history. History reviewed. No pertinent family history. Social History:  reports that she has been smoking.  She  does not have any smokeless tobacco history on file. She reports that she does not drink alcohol or use illicit drugs. Allergies: No Known Allergies No prescriptions prior to admission    Home: Home Living Family/patient expects to be discharged to:: Private residence Living Arrangements: Other relatives Available Help at Discharge: Family;Available PRN/intermittently Type of Home: House Home Access: Stairs to enter [employer] of Steps: unsure Home Layout: One level Home Equipment: Walker - 2 wheels;Wheelchair - manual   Functional History: Prior Function Level of Independence: Needs assistance Gait / Transfers Assistance Needed: pt with decreased balance/falls last month or so Comments: pt typically sponge bathes  Functional Status:  Mobility: Bed Mobility Overal bed mobility: Needs Assistance Bed Mobility: Supine to Sit Rolling: Min assist Supine to sit: Mod assist Sit to supine: Min assist General bed mobility comments: assist to scoot to EOB. Cues for technique. Transfers Overall transfer level: Needs assistance Equipment used: Rolling walker (2 wheeled) Transfers: Sit to/from Stand Sit to Stand: Min assist;Mod assist General transfer comment: cues for technique. Ambulation/Gait Ambulation/Gait assistance: Min assist Ambulation Distance (Feet): 80 Feet (with one seated rest break) Assistive device: Rolling walker (2 wheeled) Gait Pattern/deviations: Step-through pattern;Decreased step length - right;Decreased dorsiflexion - right;Decreased weight shift to left;Shuffle;Drifts right/left Gait velocity: decreased Gait velocity interpretation: <1.8 ft/sec, indicative of risk for recurrent falls General Gait Details: Pt listing to the right and trunk leaning to the right.  Pt with decreased progression of right foot foward during gait compared to left foot (functionally making the right leg seem weaker, however, during seated MMT both seemed equally weak).      ADL: ADL Overall ADL's : Needs assistance/impaired Eating/Feeding: Set up;Sitting;Supervision/ safety Grooming: Applying deodorant;Standing;Sitting;Moderate assistance;Maximal assistance (Min-Max A  for balance at sink) Upper Body Bathing: Maximal assistance;Standing;Moderate assistance Lower Body Bathing: Maximal assistance;Sit to/from stand Upper Body Dressing : Set up;Supervision/safety;Sitting (donned gown) Lower Body Dressing: Sit to/from stand;Maximal assistance Toilet Transfer: Minimal assistance;Moderate assistance;Ambulation;RW;BSC Functional mobility during ADLs: Minimal assistance;Moderate assistance;Rolling walker General ADL Comments:  Pt standing at sink and says "ouch" and urinates on floor. Pt performed ADLs at sink. Pt with decreased balance requiring Min-Max A (cues given for foot placement). Pt attempting to put deodorant in mouth. Pt with apparent cognitive deficits.   Cognition: Cognition Overall Cognitive Status: Impaired/Different from baseline Orientation Level: Oriented to person;Oriented to place;Oriented to situation;Disoriented to time Cognition Arousal/Alertness: Awake/alert Behavior During Therapy: WFL for tasks assessed/performed;Flat affect Overall Cognitive Status: Impaired/Different from baseline Area of Impairment: Orientation;Attention;Following commands;Safety/judgement;Problem solving Orientation Level: Disoriented to;Time Current Attention Level: Sustained Memory: Decreased short-term memory Following Commands: Follows one step commands inconsistently Safety/Judgement: Decreased awareness of safety Awareness: Intellectual Problem Solving: Slow processing;Requires verbal cues;Requires tactile cues;Decreased initiation;Difficulty sequencing General Comments: Pt with expressive difficulties.  Does well with one step commands.  Not aware at all of her deficits in balance, strength, which way she is leaning while walking.   Physical Exam: Blood  pressure 152/71, pulse 78, temperature 97.7 F (36.5 C), temperature source Oral, resp. rate 18, height 5\' 1"  (1.549 m), weight 59.1 kg (130 lb 4.7 oz), SpO2 98.00%. Physical Exam General: no distress. Head shaven on left Eyes: EOM are normal HEENT: edentulous, oral mucosa pink/moist.  Neck: Normal range of motion. Neck supple. No thyromegaly present.  Cardiovascular: Normal rate and regular rhythm. Systolic murmur Respiratory: Effort normal and breath sounds normal. No respiratory distress.  GI: Soft. Bowel sounds are normal. She exhibits no distension.  Neurological:  Patient is alert and makes good eye contact with examiner. She does exhibit some delay in processing, better with cueing. She was able to provide her name, age and date of birth. She has difficulty geographical information. Sometimes echolalic.  Follows simple commands. Visual acuity fair. Seems to scan to all visual fields.  Strength 4/5 bilateral deltoid, bicep, tricep, HI. LE: 3/5 HF, 4- KE and 4- to 4/5 with ADF/APF. Sensation? Diminished RLE----inconsistent. DTR's 1+. Skin:  Craniotomy site with staples intact  Results for orders placed during the hospital encounter of 06/17/14 (from the past 48 hour(s))  GLUCOSE, CAPILLARY     Status: Abnormal   Collection Time    06/26/14 11:54 AM      Result Value Ref Range   Glucose-Capillary 133 (*) 70 - 99 mg/dL  GLUCOSE, CAPILLARY     Status: Abnormal   Collection Time    06/26/14  5:01 PM      Result Value Ref Range   Glucose-Capillary 105 (*) 70 - 99 mg/dL   Comment 1 Notify RN     Comment 2 Documented in Chart    GLUCOSE, CAPILLARY     Status: Abnormal   Collection Time    06/26/14  9:08 PM      Result Value Ref Range   Glucose-Capillary 114 (*) 70 - 99 mg/dL   Comment 1 Notify RN     Comment 2 Documented in Chart    GLUCOSE, CAPILLARY     Status: None   Collection Time    06/27/14  8:05 AM      Result Value Ref Range   Glucose-Capillary 90  70 - 99 mg/dL    Comment 1 Notify RN     Comment 2 Documented in Chart  GLUCOSE, CAPILLARY     Status: Abnormal   Collection Time    06/27/14 11:46 AM      Result Value Ref Range   Glucose-Capillary 181 (*) 70 - 99 mg/dL  GLUCOSE, CAPILLARY     Status: Abnormal   Collection Time    06/27/14  5:12 PM      Result Value Ref Range   Glucose-Capillary 147 (*) 70 - 99 mg/dL  GLUCOSE, CAPILLARY     Status: Abnormal   Collection Time    06/27/14  8:56 PM      Result Value Ref Range   Glucose-Capillary 186 (*) 70 - 99 mg/dL  GLUCOSE, CAPILLARY     Status: None   Collection Time    06/28/14  6:41 AM      Result Value Ref Range   Glucose-Capillary 72  70 - 99 mg/dL   Comment 1 Notify RN     No results found.     Medical Problem List and Plan: 1. 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 2.  DVT Prophylaxis/Anticoagulation: SCDs. Monitor for any signs of DVT 3. Pain Management: Hydrocodone as needed. Monitor with increased mobility 4. Mood/bipolar disorder: Risperdal 4 mg each bedtime. 5. Neuropsych: This patient is not capable of making decisions on her own behalf. 6. Skin/Wound Care: Routine skin checks monitor craniotomy incision 7. Fluids/Electrolytes/Nutrition: Followup chemistries were strict I and O. Provide nutritional supplements as needed 8. Seizure prophylaxis. Keppra 500 mg twice a day 9. Diabetes mellitus. Glucophage 1000 mg twice a day. Check blood sugars a.c. and at bedtime. Monitor closely while on Decadron taper.    Post Admission Physician Evaluation: 1. Functional deficits secondary  to metastatic lung ca to the left frontal and occipital brain, s/p resection.. 2. Patient is admitted to receive collaborative, interdisciplinary care between the physiatrist, rehab nursing staff, and therapy team. 3. Patient's level of medical complexity and substantial therapy needs in context of that medical  necessity cannot be provided at a lesser intensity of care such as a SNF. 4. Patient has experienced substantial functional loss from his/her baseline which was documented above under the "Functional History" and "Functional Status" headings.  Judging by the patient's diagnosis, physical exam, and functional history, the patient has potential for functional progress which will result in measurable gains while on inpatient rehab.  These gains will be of substantial and practical use upon discharge  in facilitating mobility and self-care at the household level. 5. Physiatrist will provide 24 hour management of medical needs as well as oversight of the therapy plan/treatment and provide guidance as appropriate regarding the interaction of the two. 6. 24 hour rehab nursing will assist with bladder management, bowel management, safety, skin/wound care, disease management, medication administration, pain management and patient education  and help integrate therapy concepts, techniques,education, etc. 7. PT will assess and treat for/with: Lower extremity strength, range of motion, stamina, balance, functional mobility, safety, adaptive techniques and equipment, NMR, cognitive perceptual awareness, family education, pain mgt.   Goals are: supervision. 8. OT will assess and treat for/with: ADL's, functional mobility, safety, upper extremity strength, adaptive techniques and equipment, NMR, cognitive perceptual awareness, education, leisure awareness, pain control.   Goals are: supervision. Therapy may proceed with showering this patient. 9. SLP will assess and treat for/with: cognition, communication.  Goals are: supervision. 10. Case Management and Social Worker will assess and treat for psychological issues and discharge planning. 11. Team conference will be  held weekly to assess progress toward goals and to determine barriers to discharge. 12. Patient will receive at least 3 hours of therapy per day at least 5  days per week. 13. ELOS: 10-11 days       14. Prognosis:  excellent     Meredith Staggers, MD, [facility] 06/28/2014   06/28/2014

## 2014-06-28 NOTE — Progress Notes (Signed)
Physical Therapy Treatment Patient Details Name: [name] MRN: [id number] DOB: 1950/10/13 Today's Date: 06/28/2014    History of Present Illness The patient is a 63 year old female admitted to [facility] on 06/17/14 due to recent h/o weight loss, falling, and decreased mental status. Head CT demonstrated a left frontal lesion. A neurosurgical consultation was requested.  The pt  was dx with left frontal lobe tumor.   Pt is now s/p left frontal crani and left frontal occipital crani throgh separate incision to remove brain tumor on 06/23/14.  Pt with other significant PMHx of bipolar disorder.    PT Comments    Pt is progressing well with her mobility.  She fatigues easily and starts to drag her right foot.  Pt has difficulty problem solving and sequencing her way through simple tasks.  She continues to be appropriate for CIR level therapy and the plan is for her to d/c this evening.  PT will continue to follow acutely.   Follow Up Recommendations  CIR     Equipment Recommendations  Rolling walker with 5" wheels    Recommendations for Other Services   NA     Precautions / Restrictions Precautions Precautions: Fall Precaution Comments: due to instability in standing.     Mobility  Bed Mobility Overal bed mobility: Needs Assistance Bed Mobility: Supine to Sit;Sit to Supine     Supine to sit: Mod assist Sit to supine: Min assist   General bed mobility comments: Pt is mod assist to get to sitting to support trunk during transitions.  Min assist to get legs back into the bed from sitting to supine.   Transfers Overall transfer level: Needs assistance Equipment used: Rolling walker (2 wheeled) Transfers: Sit to/from Stand Sit to Stand: Min assist         General transfer comment: Min assist to get to standing to support trunk for balance and transitions over weak legs.  RW also needed to be stabilized.   Ambulation/Gait Ambulation/Gait assistance: Min assist Ambulation  Distance (Feet): 180 Feet Assistive device: Rolling walker (2 wheeled) Gait Pattern/deviations: Step-through pattern;Decreased step length - right;Decreased dorsiflexion - right;Decreased weight shift to right;Shuffle Gait velocity: 0.62 ft/sec Gait velocity interpretation: <1.8 ft/sec, indicative of risk for recurrent falls General Gait Details: Pt is dragging her right foot and by the end of gait needed cues to pick it up off of the ground.  Min assist to support trunk for balance over weak legs.           Balance Overall balance assessment: Needs assistance Sitting-balance support: Feet supported;Bilateral upper extremity supported;No upper extremity supported Sitting balance-Leahy Scale: Fair Sitting balance - Comments: min guard assist to prevent LOB while trying to donn socks.  Postural control: Posterior lean Standing balance support: Bilateral upper extremity supported Standing balance-Leahy Scale: Poor Standing balance comment: needs external assist to maintain standing.                     Cognition Arousal/Alertness: Awake/alert Behavior During Therapy: Flat affect Overall Cognitive Status: Impaired/Different from baseline     Current Attention Level: Sustained Memory: Decreased short-term memory Following Commands: Follows one step commands with increased time Safety/Judgement: Decreased awareness of safety;Decreased awareness of deficits Awareness: Intellectual Problem Solving: Slow processing;Decreased initiation;Difficulty sequencing;Requires verbal cues;Requires tactile cues General Comments: Pt with difficulty sequencing.  I gave her the task of putting on her socks EOB to assess her sitting balance and she was trying to fold her socks first and then  she tried to put her sock on her knee.         General Comments General comments (skin integrity, edema, etc.): Pt with urine incontinence with no awareness of the fact that she was wet or had gone to the  bathroom in the bed.        Pertinent Vitals/Pain Pain Assessment: No/denies pain           PT Goals (current goals can now be found in the care plan section) Acute Rehab PT Goals Patient Stated Goal: none stated Progress towards PT goals: Progressing toward goals    Frequency  Min 3X/week    PT Plan Current plan remains appropriate       End of Session Equipment Utilized During Treatment: Gait belt Activity Tolerance: Patient limited by fatigue Patient left: in bed;with call bell/phone within reach;with bed alarm set     Time: 4196-2229 PT Time Calculation (min): 26 min  Charges:  $Gait Training: 23-37 mins                      Blayne Garlick B. Susann Lawhorne, PT, DPT [phone]   06/28/2014, 5:21 PM

## 2014-06-28 NOTE — Progress Notes (Signed)
Nancy Staggers, MD Physician Signed Physical Medicine and Rehabilitation H&P Service date: 06/28/2014 7:08 AM  Related encounter: Admission (Current) from 06/17/2014 in [place]       Physical Medicine and Rehabilitation Admission H&P      Chief Complaint   Patient presents with   .  Fall    : HPI: [name] is a 63 y.o. right-handed female with history of diabetes mellitus, small cell lung cancer initially diagnosed 7 years ago and treated with radiation chemotherapy. Patient lives with her brother and reportedly use a walker prior to admission. Admitted 06/18/2014 with altered mental status and recent falls. Cranial CT scan demonstrated large frontal mass. Workup included CT of chest and abdomen and pelvis which were negative. Attempts were made to get brain MRI that she had some sort of metallic object which caused a lot of artifact on the MRI imaging. If old CT of the head demonstrated large frontal and left parietal-occipital mass. Underwent left frontal craniotomy for gross total resection of frontal brain tumor, left suboccipital craniotomy for gross total resection of left occipital tumor 06/23/2014 per Dr. Arnoldo Morale. Keppra added for seizure prophylaxis. Followup MRI postoperatively 06/24/2014 showing no enhancing marginal tissues seen at the site of resection. Await oncology followup question plan radiation therapy. Patient remains on Decadron protocol and decreased to 2 mg every 12 hours 06/28/2014. Tolerating a regular diet. Physical and occupational therapy reevaluations completed after craniotomy and noted expressive difficulties and decreasing cognition with recommendations of physical medicine rehabilitation consult     ROS Review of Systems   Musculoskeletal: Positive for falls.   Neurological: Positive for headaches.   Psychiatric/Behavioral:   Bipolar disorder   All other systems reviewed and are negative    Past Medical  History   Diagnosis  Date   .  Bipolar disorder     .  Cancer         small call lung cancer    History reviewed. No pertinent past surgical history. History reviewed. No pertinent family history. Social History: reports that she has been smoking.  She does not have any smokeless tobacco history on file. She reports that she does not drink alcohol or use illicit drugs. Allergies: No Known Allergies No prescriptions prior to admission      Home: Home Living Family/patient expects to be discharged to:: Private residence Living Arrangements: Other relatives Available Help at Discharge: Family;Available PRN/intermittently Type of Home: House Home Access: Stairs to enter [employer] of Steps: unsure Home Layout: One level Home Equipment: Walker - 2 wheels;Wheelchair - manual    Functional History: Prior Function Level of Independence: Needs assistance Gait / Transfers Assistance Needed: pt with decreased balance/falls last month or so Comments: pt typically sponge bathes   Functional Status:   Mobility: Bed Mobility Overal bed mobility: Needs Assistance Bed Mobility: Supine to Sit Rolling: Min assist Supine to sit: Mod assist Sit to supine: Min assist General bed mobility comments: assist to scoot to EOB. Cues for technique. Transfers Overall transfer level: Needs assistance Equipment used: Rolling walker (2 wheeled) Transfers: Sit to/from Stand Sit to Stand: Min assist;Mod assist General transfer comment: cues for technique. Ambulation/Gait Ambulation/Gait assistance: Min assist Ambulation Distance (Feet): 80 Feet (with one seated rest break) Assistive device: Rolling walker (2 wheeled) Gait Pattern/deviations: Step-through pattern;Decreased step length - right;Decreased dorsiflexion - right;Decreased weight shift to left;Shuffle;Drifts right/left Gait velocity: decreased Gait velocity interpretation: <1.8 ft/sec, indicative of risk for recurrent  falls General Gait Details: Pt listing to the right and trunk leaning to the right.  Pt with decreased progression of right foot foward during gait compared to left foot (functionally making the right leg seem weaker, however, during seated MMT both seemed equally weak).    ADL: ADL Overall ADL's : Needs assistance/impaired Eating/Feeding: Set up;Sitting;Supervision/ safety Grooming: Applying deodorant;Standing;Sitting;Moderate assistance;Maximal assistance (Min-Max A for balance at sink) Upper Body Bathing: Maximal assistance;Standing;Moderate assistance Lower Body Bathing: Maximal assistance;Sit to/from stand Upper Body Dressing : Set up;Supervision/safety;Sitting (donned gown) Lower Body Dressing: Sit to/from stand;Maximal assistance Toilet Transfer: Minimal assistance;Moderate assistance;Ambulation;RW;BSC Functional mobility during ADLs: Minimal assistance;Moderate assistance;Rolling walker General ADL Comments:  Pt standing at sink and says "ouch" and urinates on floor. Pt performed ADLs at sink. Pt with decreased balance requiring Min-Max A (cues given for foot placement). Pt attempting to put deodorant in mouth. Pt with apparent cognitive deficits.    Cognition: Cognition Overall Cognitive Status: Impaired/Different from baseline Orientation Level: Oriented to person;Oriented to place;Oriented to situation;Disoriented to time Cognition Arousal/Alertness: Awake/alert Behavior During Therapy: WFL for tasks assessed/performed;Flat affect Overall Cognitive Status: Impaired/Different from baseline Area of Impairment: Orientation;Attention;Following commands;Safety/judgement;Problem solving Orientation Level: Disoriented to;Time Current Attention Level: Sustained Memory: Decreased short-term memory Following Commands: Follows one step commands inconsistently Safety/Judgement: Decreased awareness of safety Awareness: Intellectual Problem Solving: Slow processing;Requires verbal  cues;Requires tactile cues;Decreased initiation;Difficulty sequencing General Comments: Pt with expressive difficulties.  Does well with one step commands. Not aware at all of her deficits in balance, strength, which way she is leaning while walking.    Physical Exam: Blood pressure 152/71, pulse 78, temperature 97.7 F (36.5 C), temperature source Oral, resp. rate 18, height 5\' 1"  (1.549 m), weight 59.1 kg (130 lb 4.7 oz), SpO2 98.00%. Physical Exam General: no distress. Head shaven on left Eyes: EOM are normal HEENT: edentulous, oral mucosa pink/moist.   Neck: Normal range of motion. Neck supple. No thyromegaly present.   Cardiovascular: Normal rate and regular rhythm. Systolic murmur Respiratory: Effort normal and breath sounds normal. No respiratory distress.   GI: Soft. Bowel sounds are normal. She exhibits no distension.  Neurological:  Patient is alert and makes good eye contact with examiner. She does exhibit some delay in processing, better with cueing. She was able to provide her name, age and date of birth. She has difficulty geographical information. Sometimes echolalic.  Follows simple commands. Visual acuity fair. Seems to scan to all visual fields.  Strength 4/5 bilateral deltoid, bicep, tricep, HI. LE: 3/5 HF, 4- KE and 4- to 4/5 with ADF/APF. Sensation? Diminished RLE----inconsistent. DTR's 1+. Skin:  Craniotomy site with staples intact    Results for orders placed during the hospital encounter of 06/17/14 (from the past 48 hour(s))   GLUCOSE, CAPILLARY     Status: Abnormal     Collection Time      06/26/14 11:54 AM       Result  Value  Ref Range     Glucose-Capillary  133 (*)  70 - 99 mg/dL   GLUCOSE, CAPILLARY     Status: Abnormal     Collection Time      06/26/14  5:01 PM       Result  Value  Ref Range     Glucose-Capillary  105 (*)  70 - 99 mg/dL     Comment 1  Notify RN        Comment 2  Documented in Chart      GLUCOSE, CAPILLARY  Status: Abnormal      Collection Time      06/26/14  9:08 PM       Result  Value  Ref Range     Glucose-Capillary  114 (*)  70 - 99 mg/dL     Comment 1  Notify RN        Comment 2  Documented in Chart      GLUCOSE, CAPILLARY     Status: None     Collection Time      06/27/14  8:05 AM       Result  Value  Ref Range     Glucose-Capillary  90   70 - 99 mg/dL     Comment 1  Notify RN        Comment 2  Documented in Chart      GLUCOSE, CAPILLARY     Status: Abnormal     Collection Time      06/27/14 11:46 AM       Result  Value  Ref Range     Glucose-Capillary  181 (*)  70 - 99 mg/dL   GLUCOSE, CAPILLARY     Status: Abnormal     Collection Time      06/27/14  5:12 PM       Result  Value  Ref Range     Glucose-Capillary  147 (*)  70 - 99 mg/dL   GLUCOSE, CAPILLARY     Status: Abnormal     Collection Time      06/27/14  8:56 PM       Result  Value  Ref Range     Glucose-Capillary  186 (*)  70 - 99 mg/dL   GLUCOSE, CAPILLARY     Status: None     Collection Time      06/28/14  6:41 AM       Result  Value  Ref Range     Glucose-Capillary  72   70 - 99 mg/dL     Comment 1  Notify RN       No results found.         Medical Problem List and Plan: 1. 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 2.  DVT Prophylaxis/Anticoagulation: SCDs. Monitor for any signs of DVT 3. Pain Management: Hydrocodone as needed. Monitor with increased mobility 4. Mood/bipolar disorder: Risperdal 4 mg each bedtime. 5. Neuropsych: This patient is not capable of making decisions on her own behalf. 6. Skin/Wound Care: Routine skin checks monitor craniotomy incision 7. Fluids/Electrolytes/Nutrition: Followup chemistries were strict I and O. Provide nutritional supplements as needed 8. Seizure prophylaxis. Keppra 500 mg twice a day 9. Diabetes mellitus. Glucophage 1000 mg twice a day. Check blood sugars a.c. and at bedtime. Monitor closely while on  Decadron taper.       Post Admission Physician Evaluation: Functional deficits secondary  to metastatic lung ca to the left frontal and occipital brain, s/p resection.. Patient is admitted to receive collaborative, interdisciplinary care between the physiatrist, rehab nursing staff, and therapy team. Patient's level of medical complexity and substantial therapy needs in context of that medical necessity cannot be provided at a lesser intensity of care such as a SNF. Patient has experienced substantial functional loss from his/her baseline which was documented above under the "Functional History" and "Functional Status" headings.  Judging by the patient's diagnosis, physical exam, and functional history, the patient has potential for functional progress which  will result in measurable gains while on inpatient rehab.  These gains will be of substantial and practical use upon discharge  in facilitating mobility and self-care at the household level. Physiatrist will provide 24 hour management of medical needs as well as oversight of the therapy plan/treatment and provide guidance as appropriate regarding the interaction of the two. 24 hour rehab nursing will assist with bladder management, bowel management, safety, skin/wound care, disease management, medication administration, pain management and patient education  and help integrate therapy concepts, techniques,education, etc. PT will assess and treat for/with: Lower extremity strength, range of motion, stamina, balance, functional mobility, safety, adaptive techniques and equipment, NMR, cognitive perceptual awareness, family education, pain mgt.   Goals are: supervision. OT will assess and treat for/with: ADL's, functional mobility, safety, upper extremity strength, adaptive techniques and equipment, NMR, cognitive perceptual awareness, education, leisure awareness, pain control.   Goals are: supervision. Therapy may proceed with showering this  patient. SLP will assess and treat for/with: cognition, communication.  Goals are: supervision. Case Management and Social Worker will assess and treat for psychological issues and discharge planning. Team conference will be held weekly to assess progress toward goals and to determine barriers to discharge. Patient will receive at least 3 hours of therapy per day at least 5 days per week. ELOS: 10-11 days        Prognosis:  excellent         Nancy Staggers, MD, [place] [facility] 06/28/2014    06/28/2014  Revision History...     Date/Time User Action   06/28/2014 3:59 PM Nancy Staggers, MD Sign   06/28/2014 1:05 PM Cathlyn Parsons, PA-C [place]  View Details Report   Routing History...     Date/Time From To Method   06/28/2014 3:59 PM Nancy Staggers, MD Nancy Staggers, MD In Basket

## 2014-06-28 NOTE — Progress Notes (Signed)
Inpatient Rehabilitation  I met with Ms. Valko at the bedside to discuss her post acute rehab options.  I am attempting to reach her brother due to her cognitive deficits.  Chart lists [phone] as his primary number, however when called, message received that the number can't accept calls.  Pt was able to provide an alternate number of [phone] for her brother, however there is no answer and the voice mail box is full.  I will continue to reach out to her brother to determine their desires for pt's post acute rehab.  Please call if questions.  [place] Admissions Coordinator Cell [phone] Office [phone]

## 2014-06-28 NOTE — Progress Notes (Signed)
Inpatient Rehabilitation  I have been able to speak with pt's brother Margot Chimes about pt's post acute rehab options.  He desires for pt. To admit to CIR.  I note Dr. Arnoldo Morale has written DC summary and DC orders.  I have left him a voice mail to inform him that I will admit pt. To CIR today.  I also discussed with pt's RN AnnaMarie and with Hassan Rowan, [place] and [place] SW.  I will arrange for admit today.  Please call if questions.  [place] Admissions Coordinator Cell [phone] Office [phone]

## 2014-06-28 NOTE — Progress Notes (Signed)
Report given to [facility], [place] RN. Pt transported per bed to [facility] by Suzy Bouchard, RN, and nurse tech, Jonelle Sidle.  No distress noted.   [name] I 06/28/2014 6:52 PM

## 2014-06-28 NOTE — Discharge Summary (Signed)
Physician Discharge Summary  Patient ID: [name] MRN: [id number] DOB/AGE: May 22, 1951 63 y.o.  Admit date: 06/17/2014 Discharge date: 06/28/2014  Admission Diagnoses:left frontal and left parietal occipital brain tumor  Discharge Diagnoses: the same Active Problems:   Cerebral hemorrhage   Brain tumor   Discharged Condition: fair  Hospital Course: I performed a left frontal and left occipital craniotomy on the patient for resection of her tumors on 06/23/2014. The surgery went well.  The patient's postoperative course was unremarkable. Arrangements were made for her to go to rehabilitation. She was ready to be transferred on 06/28/2014. The patient was given oral and written discharge instructions. She was instructed to followup me in about a week to get her staples out.at the time of this discharge the pathology results are pending.  Consults:rehabilitation, PT Significant Diagnostic Studies:brain MRI Treatments:left frontal and parietal occipital craniotomy for resection of brain tumors using the [employer] neuronavigational Discharge Exam: Blood pressure 152/71, pulse 78, temperature 97.7 F (36.5 C), temperature source Oral, resp. rate 18, height 5\' 1"  (1.549 m), weight 59.1 kg (130 lb 4.7 oz), SpO2 98.00%. The patient is alert and pleasant. She is in no apparent distress. Her speech is normal. Her strength is normal. Her wounds are healing well.  Disposition: rehabilitation  Discharge Instructions   Call MD for:  difficulty breathing, headache or visual disturbances    Complete by:  As directed      Call MD for:  extreme fatigue    Complete by:  As directed      Call MD for:  hives    Complete by:  As directed      Call MD for:  persistant dizziness or light-headedness    Complete by:  As directed      Call MD for:  persistant nausea and vomiting    Complete by:  As directed      Call MD for:  redness, tenderness, or signs of infection (pain, swelling, redness, odor  or green/yellow discharge around incision site)    Complete by:  As directed      Call MD for:  severe uncontrolled pain    Complete by:  As directed      Call MD for:  temperature >100.4    Complete by:  As directed      Diet - low sodium heart healthy    Complete by:  As directed      Discharge instructions    Complete by:  As directed   Call [phone] for a followup appointment. Take a stool softener while you are using pain medications.     Driving Restrictions    Complete by:  As directed   Do not drive for 2 weeks.     Increase activity slowly    Complete by:  As directed      Lifting restrictions    Complete by:  As directed   Do not lift more than 5 pounds. No excessive bending or twisting.     May shower / Bathe    Complete by:  As directed   He may shower after the pain she is removed 3 days after surgery. Leave the incision alone.     Remove dressing in 48 hours    Complete by:  As directed   Your stitches are under the scan and will dissolve by themselves. The Steri-Strips will fall off after you take a few showers. Do not rub back or pick at the wound, Leave the wound alone.  Medication List         dexamethasone 2 MG tablet  Commonly known as:  DECADRON  Take 1 tablet (2 mg total) by mouth every 12 (twelve) hours.     DSS 100 MG Caps  Take 100 mg by mouth 2 (two) times daily.     HYDROcodone-acetaminophen 5-325 MG per tablet  Commonly known as:  NORCO/VICODIN  Take 1 tablet by mouth every 4 (four) hours as needed for moderate pain.     levETIRAcetam 500 MG tablet  Commonly known as:  KEPPRA  Take 1 tablet (500 mg total) by mouth 2 (two) times daily.     pantoprazole 40 MG tablet  Commonly known as:  PROTONIX  Take 1 tablet (40 mg total) by mouth daily at 12 noon.         SignedOphelia Charter 06/28/2014, 7:47 AM

## 2014-06-29 ENCOUNTER — Inpatient Hospital Stay (HOSPITAL_COMMUNITY): Payer: Medicaid Other | Admitting: Occupational Therapy

## 2014-06-29 ENCOUNTER — Inpatient Hospital Stay (HOSPITAL_COMMUNITY): Payer: Medicaid Other

## 2014-06-29 ENCOUNTER — Inpatient Hospital Stay (HOSPITAL_COMMUNITY): Payer: Medicaid Other | Admitting: Speech Pathology

## 2014-06-29 LAB — CBC WITH DIFFERENTIAL/PLATELET
Basophils Absolute: 0 10*3/uL (ref 0.0–0.1)
Basophils Relative: 0 % (ref 0–1)
EOS ABS: 0 10*3/uL (ref 0.0–0.7)
Eosinophils Relative: 0 % (ref 0–5)
HCT: 39.3 % (ref 36.0–46.0)
Hemoglobin: 13.9 g/dL (ref 12.0–15.0)
LYMPHS ABS: 1 10*3/uL (ref 0.7–4.0)
Lymphocytes Relative: 8 % — ABNORMAL LOW (ref 12–46)
MCH: 29.8 pg (ref 26.0–34.0)
MCHC: 35.4 g/dL (ref 30.0–36.0)
MCV: 84.2 fL (ref 78.0–100.0)
Monocytes Absolute: 0.7 10*3/uL (ref 0.1–1.0)
Monocytes Relative: 6 % (ref 3–12)
Neutro Abs: 10.8 10*3/uL — ABNORMAL HIGH (ref 1.7–7.7)
Neutrophils Relative %: 86 % — ABNORMAL HIGH (ref 43–77)
Platelets: 194 10*3/uL (ref 150–400)
RBC: 4.67 MIL/uL (ref 3.87–5.11)
RDW: 13.9 % (ref 11.5–15.5)
WBC: 12.6 10*3/uL — AB (ref 4.0–10.5)

## 2014-06-29 LAB — GLUCOSE, CAPILLARY
GLUCOSE-CAPILLARY: 114 mg/dL — AB (ref 70–99)
GLUCOSE-CAPILLARY: 105 mg/dL — AB (ref 70–99)
GLUCOSE-CAPILLARY: 92 mg/dL (ref 70–99)
Glucose-Capillary: 93 mg/dL (ref 70–99)

## 2014-06-29 LAB — COMPREHENSIVE METABOLIC PANEL
ALBUMIN: 2.3 g/dL — AB (ref 3.5–5.2)
ALK PHOS: 54 U/L (ref 39–117)
ALT: 25 U/L (ref 0–35)
AST: 16 U/L (ref 0–37)
Anion gap: 14 (ref 5–15)
BUN: 15 mg/dL (ref 6–23)
CO2: 26 mEq/L (ref 19–32)
Calcium: 8.3 mg/dL — ABNORMAL LOW (ref 8.4–10.5)
Chloride: 102 mEq/L (ref 96–112)
Creatinine, Ser: 0.49 mg/dL — ABNORMAL LOW (ref 0.50–1.10)
GFR calc Af Amer: >90 mL/min (ref >90)
GFR calc non Af Amer: >90 mL/min (ref >90)
GLUCOSE: 82 mg/dL (ref 70–99)
POTASSIUM: 4.3 meq/L (ref 3.7–5.3)
Sodium: 142 mEq/L (ref 137–147)
TOTAL PROTEIN: 5.1 g/dL — AB (ref 6.0–8.3)
Total Bilirubin: 0.4 mg/dL (ref 0.3–1.2)

## 2014-06-29 MED ORDER — LEVETIRACETAM 100 MG/ML PO SOLN
500.0000 mg | Freq: Two times a day (BID) | ORAL | Status: DC
  Administered 2014-06-29 – 2014-07-15 (×32): 500 mg via ORAL
  Filled 2014-06-29 (×39): qty 5

## 2014-06-29 MED ORDER — DOCUSATE SODIUM 50 MG/5ML PO LIQD
100.0000 mg | Freq: Two times a day (BID) | ORAL | Status: DC
  Administered 2014-06-29 – 2014-07-15 (×32): 100 mg via ORAL
  Filled 2014-06-29 (×37): qty 10

## 2014-06-29 MED ORDER — CETYLPYRIDINIUM CHLORIDE 0.05 % MT LIQD
7.0000 mL | Freq: Two times a day (BID) | OROMUCOSAL | Status: DC
  Administered 2014-06-29 – 2014-07-14 (×29): 7 mL via OROMUCOSAL

## 2014-06-29 NOTE — Evaluation (Signed)
Speech Language Pathology Assessment and Plan  Patient Details  Name: [name] MRN: [id number] Date of Birth: 1950/09/11  SLP Diagnosis: Cognitive Impairments  Rehab Potential: Good ELOS: 12-14 days    Today's Date: 06/29/2014 SLP Individual Time: 0930-1030 SLP Individual Time Calculation (min): 60 min   Problem List:  Patient Active Problem List   Diagnosis Date Noted  . Cerebral hemorrhage 06/18/2014  . Brain tumor 06/18/2014  . SMALL CELL CARCINOMA OF THE LUNG 12/19/2006  . 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 TOBACCO ABUSE 12/19/2006  . HYPERTENSION, BENIGN 12/19/2006   Past Medical History:  Past Medical History  Diagnosis Date  . Bipolar disorder   . Cancer     small call lung cancer   Past Surgical History:  Past Surgical History  Procedure Laterality Date  . Craniotomy Left 06/23/2014    Procedure: Left craniotomy for tumor resection;  Surgeon: Newman Pies, MD;  Location: [place] [facility];  Service: Neurosurgery;  Laterality: Left;  Left craniotomy for tumor resection    Assessment / Plan / Recommendation Clinical Impression [name] is a 63 y.o. right-handed female with history of diabetes mellitus, small cell lung cancer initially diagnosed 7 years ago and treated with radiation chemotherapy. Patient lives with her brother and reportedly use a walker prior to admission. Admitted 06/18/2014 with altered mental status and recent falls. Cranial CT scan demonstrated large frontal mass. Workup included CT of chest and abdomen and pelvis which were negative. Attempts were made to get brain MRI that she had some sort of metallic object which caused a lot of artifact on the MRI imaging. If old CT of the head demonstrated large frontal and left parietal-occipital mass. Underwent left frontal craniotomy for gross total resection of frontal brain tumor, left suboccipital craniotomy for gross total resection of left occipital tumor 06/23/2014 per Dr.  Arnoldo Morale. Keppra added for seizure prophylaxis. Follow up MRI postoperatively 06/24/2014 showing no enhancing marginal tissues seen at the site of resection. Await oncology follow up question plan radiation therapy. Patient remains on Decadron protocol and decreased to 2 mg every 12 hours 06/28/2014. Tolerating a regular diet. Physical and occupational therapy reevaluations completed after craniotomy and noted expressive difficulties and decreasing cognition with recommendations of physical medicine rehabilitation consult. Patient admitted to CIR on 06/28/14. Patient administered a BSE and demonstrates efficient mastication; however, utilized large bites and sips. Therefore, recommend patient have full supervision during meals with cueing for utilization of swallowing compensatory strategies of small bites and sips. Patient also administered a cognitive-linguistic evaluation demonstrating significant cognitive-linguistic impairments impacting attention, awareness, problem solving and memory which impacts the patient's overall safety with functional tasks. Patient also demonstrates decreased frustration tolerance and decreased speech intelligibility. Patient would benefit from skilled SLP intervention to maximize her overall cognitive-linguistic, speech and swallowing function, in order to maximize her functional independence and reduce burden of care prior to discharge. Anticipate patient will require 24 hour supervision at home and f/u SLP services.   Skilled Therapeutic Interventions          Patient administered a cognitive-linguistic and bedside swallow examination. Please see above for details. Patient unable to participate in discussion of current cognitive-linguistic and swallowing functions and goals of skilled SLP intervention based on severity of impairments.   SLP Assessment  Patient will need skilled Speech Lanaguage Pathology Services during CIR admission    Recommendations  Diet Recommendations:  Regular;Thin liquid Liquid Administration via: Cup;Straw Medication Administration: Whole meds with liquid  Supervision: Patient able to self feed;Full supervision/cueing for compensatory strategies Compensations: Slow rate;Small sips/bites Postural Changes and/or Swallow Maneuvers: Seated upright 90 degrees Oral Care Recommendations: Oral care BID Recommendations for Other Services: Neuropsych consult Patient destination: Home Follow up Recommendations: 24 hour supervision/assistance;Outpatient SLP Equipment Recommended: None recommended by SLP    SLP Frequency 5 out of 7 days   SLP Treatment/Interventions Cognitive remediation/compensation;Cueing hierarchy;Internal/external aids;Environmental controls;Therapeutic Activities;Functional tasks    Pain Pain Assessment Pain Assessment: No/denies pain Prior Functioning Type of Home: House  Lives With: Family Doren Custard) Available Help at Discharge: Family;Available PRN/intermittently (Per admission report, pt's family thinks they can provide 24hr support)  Short Term Goals: Week 1: SLP Short Term Goal 1 (Week 1): Patient will consume least restrictive diet with Max A multimodal cues for utilization of swallowing compensatory strategies to minimize overt s/s of aspiration. SLP Short Term Goal 2 (Week 1): Pt will increase overall speech intelligibility by increased vocal intensity and over-articulation at the phrase level with Max A multimodal cues.  SLP Short Term Goal 3 (Week 1): Pt will use call bell to request assistance with Max A multimodal cues.  SLP Short Term Goal 4 (Week 1): Pt will sustain attention to basic functional tasks for 3 minutes with Max A multimodal cues.  SLP Short Term Goal 5 (Week 1): Pt will demonstrate functional problem solving for basic and familiar tasks with Max A multimodal cues.  SLP Short Term Goal 6 (Week 1): Pt will utilize external visual aids to recall new, daily information with Max A multimodal cues.    See FIM for current functional status Refer to Care Plan for Long Term Goals  Recommendations for other services: Neuropsych  Discharge Criteria: Patient will be discharged from SLP if patient refuses treatment 3 consecutive times without medical reason, if treatment goals not met, if there is a change in medical status, if patient makes no progress towards goals or if patient is discharged from hospital.  The above assessment, treatment plan, treatment alternatives and goals were discussed and mutually agreed upon: No family available/patient unable  Osmel Dykstra 06/29/2014, 3:30 PM

## 2014-06-29 NOTE — Evaluation (Signed)
The assessment and plan has been reviewed and SLP is in agreement. Gunnar Fusi, M.A., CCC-SLP [phone]

## 2014-06-29 NOTE — Evaluation (Signed)
Physical Therapy Assessment and Plan  Patient Details  Name: [name] MRN: [id number] Date of Birth: [birth year]  PT Diagnosis: Abnormal posture, Abnormality of gait, Cognitive deficits, Hemiplegia dominant, Impaired sensation and Muscle weakness; Decreased balance, Decreased functional endurance Rehab Potential: Good ELOS: 12-14days   Today's Date: 06/29/2014 PT Individual Time: 0800-0900 PT Individual Time Calculation (min): 60 min    Problem List:  Patient Active Problem List   Diagnosis Date Noted  . Cerebral hemorrhage 06/18/2014  . Brain tumor 06/18/2014  . SMALL CELL CARCINOMA OF THE LUNG 12/19/2006  . 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 TOBACCO ABUSE 12/19/2006  . HYPERTENSION, BENIGN 12/19/2006    Past Medical History:  Past Medical History  Diagnosis Date  . Bipolar disorder   . Cancer     small call lung cancer   Past Surgical History:  Past Surgical History  Procedure Laterality Date  . Craniotomy Left 06/23/2014    Procedure: Left craniotomy for tumor resection;  Surgeon: Newman Pies, MD;  Location: [place] [facility];  Service: Neurosurgery;  Laterality: Left;  Left craniotomy for tumor resection    Assessment & Plan Clinical Impression: [name] is a 63 y.o. right-handed female with history of diabetes mellitus, small cell lung cancer initially diagnosed 7 years ago and treated with radiation chemotherapy. Patient lives with her brother and reportedly use a walker prior to admission. Admitted 06/18/2014 with altered mental status and recent falls. Cranial CT scan demonstrated large frontal mass. Workup included CT of chest and abdomen and pelvis which were negative. Attempts were made to get brain MRI that she had some sort of metallic object which caused a lot of artifact on the MRI imaging. If old CT of the head demonstrated large frontal and left parietal-occipital mass. Underwent left frontal craniotomy for gross total resection of  frontal brain tumor, left suboccipital craniotomy for gross total resection of left occipital tumor 06/23/2014 per Dr. Arnoldo Morale. Keppra added for seizure prophylaxis. Followup MRI postoperatively 06/24/2014 showing no enhancing marginal tissues seen at the site of resection.  Await oncology followup question plan radiation therapy. Patient remains on Decadron protocol and decreased to 2 mg every 12 hours 06/28/2014. Tolerating a regular diet. Physical and occupational therapy reevaluations completed after craniotomy and noted expressive difficulties and decreasing cognition with recommendations of physical medicine rehabilitation consult. Patient transferred to CIR on 06/28/2014 .   Patient currently requires min assist with mobility secondary to muscle weakness, decreased cardiorespiratoy endurance, motor apraxia, decreased initiation, decreased attention, decreased awareness, decreased problem solving, decreased safety awareness, decreased memory and delayed processing and decreased standing balance, hemiplegia and decreased balance strategies.  Prior to hospitalization, patient was modified independent  with mobility and lived with Family Doren Custard) in a House home.  Home access is 5-6Stairs to enter.  Patient will benefit from skilled PT intervention to maximize safe functional mobility, minimize fall risk and decrease caregiver burden for planned discharge home with 24 hour supervision.  Anticipate patient will benefit from follow up [place] at discharge.  PT - End of Session Activity Tolerance: Tolerates 10 - 20 min activity with multiple rests Endurance Deficit: Yes PT Assessment Rehab Potential: Good Barriers to Discharge: Decreased caregiver support PT Patient demonstrates impairments in the following area(s): Balance;Endurance;Motor;Safety;Skin Integrity;Other (comment);Perception (strength and cognition) PT Transfers Functional Problem(s): Bed Mobility;Bed to Chair;Car;Furniture;Floor PT  Locomotion Functional Problem(s): Ambulation;Wheelchair Mobility;Stairs PT Plan PT Intensity: Minimum of 1-2 x/day ,45 to 90 minutes PT Frequency: 5 out of 7  days PT Duration Estimated Length of Stay: 12-14days PT Treatment/Interventions: Ambulation/gait training;DME/adaptive equipment instruction;Neuromuscular re-education;Community reintegration;Psychosocial support;Stair training;UE/LE Strength taining/ROM;Wheelchair propulsion/positioning;UE/LE Coordination activities;Therapeutic Activities;Skin care/wound management;Pain management;Discharge planning;Balance/vestibular training;Cognitive remediation/compensation;Disease management/prevention;Functional mobility training;Patient/family education;Splinting/orthotics;Therapeutic Exercise;Visual/perceptual remediation/compensation PT Transfers Anticipated Outcome(s): Supervision overall PT Locomotion Anticipated Outcome(s): Supevision overall PT Recommendation Recommendations for Other Services: Neuropsych consult Follow Up Recommendations: Home health PT Patient destination: Home Equipment Recommended: To be determined  Skilled Therapeutic Intervention 1:1. Pt received semi-reclined in bed, ready for therapy. PT evaluation performed, see detailed objective information below. Pt educated on rehab environment, role of therapies, goals for physical therapy and general safety plan. Pt will require ongoing education due to cognitive impairments. Tx initiated with emphasis on safety during functional transfers, gait training with RW and balance. Overall pt req min A for mobility, with mod-max multimodal cues due to cognitive impairments. Pt left sitting in w/c at end of session w/ all needs in reach and quick release belt in place.   PT Evaluation Precautions/Restrictions Precautions Precautions: Fall Restrictions Weight Bearing Restrictions: No General   Vital SignsTherapy Vitals Temp: 98.7 F (37.1 C) Temp Source: Oral Pulse Rate: 84 Resp:  18 BP: 82/59 mmHg (notified Nurse) Patient Position (if appropriate): Lying Oxygen Therapy SpO2: 96 % Pain Pain Assessment Pain Assessment: No/denies pain Home Living/Prior Functioning Home Living Available Help at Discharge: Family;Available PRN/intermittently (Per admission report, pt's family thinks they can provide 24hr support) Type of Home: House Home Access: Stairs to enter [employer] of Steps: 5-6 Entrance Stairs-Rails:  (Pt unable to remember) Home Layout: One level  Lives With: Family Doren Custard) Prior Function Level of Independence: Needs assistance with homemaking;Needs assistance with gait;Requires assistive device for independence;Needs assistance with ADLs;Needs assistance with tranfers  Able to Take Stairs?: Yes Driving: No Comments: pt typically sponge bathes- per admission report; pt unable to clearly relay PLOF due to cognitive deficits; hx of falls at home over past month Vision/Perception  Vision - Assessment See OT eval for information Additional Comments: Unable to formally assess due to cognitive deficits.  Unsure of patient's ability to be good historian regarding baseline or current level.  Patient became upset and cursing due to questions asked during screen/assessment.  Cognition Overall Cognitive Status: Impaired/Different from baseline Arousal/Alertness: Awake/alert Orientation Level: Oriented to person;Oriented to place;Oriented to time;Disoriented to situation Attention: Sustained;Selective Focused Attention: Appears intact Sustained Attention: Impaired Sustained Attention Impairment: Verbal basic;Functional basic Selective Attention: Impaired Selective Attention Impairment: Verbal basic;Functional basic Memory: Impaired Memory Impairment: Decreased short term memory;Decreased recall of new information;Decreased long term memory Decreased Long Term Memory: Verbal basic;Functional basic Decreased Short Term Memory: Verbal  basic;Functional basic Awareness: Impaired Awareness Impairment: Intellectual impairment;Emergent impairment Problem Solving: Impaired Problem Solving Impairment: Verbal basic;Functional basic Executive Function: Reasoning;Decision Making;Initiating;Self Monitoring;Self Correcting Reasoning: Impaired Reasoning Impairment: Verbal basic;Functional basic Decision Making: Impaired Decision Making Impairment: Verbal basic;Functional basic Initiating: Impaired Initiating Impairment: Verbal basic;Functional basic Self Monitoring: Impaired Self Monitoring Impairment: Verbal basic;Functional basic Self Correcting: Impaired Self Correcting Impairment: Verbal basic;Functional basic Safety/Judgment: Impaired Sensation Sensation Light Touch: Appears Intact Proprioception: Appears Intact Coordination Gross Motor Movements are Fluid and Coordinated: Yes Fine Motor Movements are Fluid and Coordinated: Yes Motor  Motor Motor: Hemiplegia, apraxia, perseverations Motor - Skilled Clinical Observations: mild R hemiplegia  Mobility Bed Mobility Bed Mobility: Supine to Sit;Rolling Left;Rolling Right Rolling Right: 5: Supervision;With rail Rolling Right Details: Verbal cues for technique Rolling Left: 5: Supervision;With rail Rolling Left Details: Verbal cues for technique Supine to Sit: 4: Min assist Supine to Sit  Details: Verbal cues for technique;Tactile cues for placement Transfers Transfers: Yes Sit to Stand: 4: Min assist;With armrests;From bed;From chair/3-in-1 Sit to Stand Details: Verbal cues for precautions/safety;Verbal cues for safe use of DME/AE Stand to Sit: 4: Min assist;With armrests;To bed;To chair/3-in-1 Stand to Sit Details (indicate cue type and reason): Verbal cues for precautions/safety;Verbal cues for safe use of DME/AE Stand Pivot Transfers: 4: Min assist Stand Pivot Transfer Details: Verbal cues for precautions/safety;Verbal cues for safe use of DME/AE Locomotion   Ambulation Ambulation: Yes Ambulation/Gait Assistance: 4: Min assist Ambulation Distance (Feet): 80 Feet Assistive device: Rolling walker Ambulation/Gait Assistance Details: Verbal cues for gait pattern;Verbal cues for safe use of DME/AE Gait Gait: Yes Gait Pattern: Impaired Gait Pattern: Step-through pattern;Decreased step length - right;Decreased step length - left;Decreased hip/knee flexion - right;Decreased hip/knee flexion - left;Narrow base of support (exacerbated on R side ) Gait velocity: decreased Stairs / Additional Locomotion Stairs: Yes Stairs Assistance: 4: Min assist Stairs Assistance Details: Verbal cues for precautions/safety Stair Management Technique: Two rails;Step to pattern;Forwards Number of Stairs: 5 Architect: Yes Wheelchair Assistance: 4: Min assist;3: Building surveyor Details: Verbal cues for technique;Verbal cues for sequencing;Tactile cues for placement;Tactile cues for sequencing Wheelchair Propulsion: Both upper extremities Wheelchair Parts Management: Needs assistance Distance: 100'  Trunk/Postural Assessment  Cervical Assessment Cervical Assessment: Exceptions to [facility] (forward head) Thoracic Assessment Thoracic Assessment: Exceptions to [facility] (mild flexion) Lumbar Assessment Lumbar Assessment: Exceptions to [facility] (posterior tilt) Postural Control Postural Control: Deficits on evaluation Righting Reactions: significantly delayed in standing, especially without use of RW  Balance Balance Balance Assessed: Yes Standardized Balance Assessment Standardized Balance Assessment: Berg Balance Test Berg Balance Test Sit to Stand: Needs minimal aid to stand or to stabilize Standing Unsupported: Unable to stand 30 seconds unassisted Sitting with Back Unsupported but Feet Supported on Floor or Stool: Able to sit 2 minutes under supervision Stand to Sit: Controls descent by using hands Transfers: Needs one person  to assist Standing Unsupported with Eyes Closed: Needs help to keep from falling Standing Ubsupported with Feet Together: Needs help to attain position and unable to hold for 15 seconds From Standing, Reach Forward with Outstretched Arm: Loses balance while trying/requires external support From Standing Position, Pick up Object from Floor: Unable to try/needs assist to keep balance From Standing Position, Turn to Look Behind Over each Shoulder: Needs assist to keep from losing balance and falling Turn 360 Degrees: Needs assistance while turning Standing Unsupported, Alternately Place Feet on Step/Stool: Needs assistance to keep from falling or unable to try Standing Unsupported, One Foot in Front: Loses balance while stepping or standing Standing on One Leg: Unable to try or needs assist to prevent fall Total Score: 8 Extremity Assessment  RUE Assessment RUE Assessment: Not tested [facility] during BADL tasks and basic mobility) LUE Assessment LUE Assessment: Not tested [facility] during BADL tasks and baskc mobility) RLE Assessment RLE Assessment: Exceptions to [facility] RLE Strength RLE Overall Strength Comments: Hip flexion: 3+/5; Knee flexion/extension: 4-/5; DF/PF: 4-/5 LLE Assessment LLE Assessment: Within Functional Limits LLE Strength LLE Overall Strength Comments: Grossly 4+/5  FIM:  FIM - Bed/Chair Transfer Bed/Chair Transfer Assistive Devices: Arm rests;Walker Bed/Chair Transfer: 4: Supine > Sit: Min A (steadying Pt. > 75%/lift 1 leg);4: Bed > Chair or W/C: Min A (steadying Pt. > 75%);4: Chair or W/C > Bed: Min A (steadying Pt. > 75%) FIM - Locomotion: Wheelchair Distance: 100' Locomotion: Wheelchair: 2: Travels 50 - 149 ft with moderate assistance (Pt:  50 - 74%) FIM - Locomotion: Ambulation Locomotion: Ambulation Assistive Devices: Administrator Ambulation/Gait Assistance: 4: Min assist Locomotion: Ambulation: 2: Travels 50 - 149 ft with minimal assistance (Pt.>75%) FIM - Locomotion:  Stairs Locomotion: Scientist, physiological: Hand rail - 2 Locomotion: Stairs: 2: Up and Down 4 - 11 stairs with minimal assistance (Pt.>75%)   Refer to Care Plan for Long Term Goals  Recommendations for other services: Neuropsych  Discharge Criteria: Patient will be discharged from PT if patient refuses treatment 3 consecutive times without medical reason, if treatment goals not met, if there is a change in medical status, if patient makes no progress towards goals or if patient is discharged from hospital.  The above assessment, treatment plan, treatment alternatives and goals were discussed and mutually agreed upon: by patient  [name] 06/29/2014, 5:43 PM

## 2014-06-29 NOTE — Evaluation (Signed)
Occupational Therapy Assessment and Plan  Patient Details  Name: [name] MRN: [id number] Date of Birth: 1951/01/03  OT Diagnosis: abnormal posture, apraxia, cognitive deficits, hemiplegia affecting dominant side, other lymphedema and decreased balance and functional endurance Rehab Potential: Rehab Potential: Good ELOS: 12-14   Today's Date: 06/29/2014 OT Individual Time: 1400-1500 OT Individual Time Calculation (min): 60 min     Problem List:  Patient Active Problem List   Diagnosis Date Noted  . Cerebral hemorrhage 06/18/2014  . Brain tumor 06/18/2014  . SMALL CELL CARCINOMA OF THE LUNG 12/19/2006  . 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 TOBACCO ABUSE 12/19/2006  . HYPERTENSION, BENIGN 12/19/2006    Past Medical History:  Past Medical History  Diagnosis Date  . Bipolar disorder   . Cancer     small call lung cancer   Past Surgical History:  Past Surgical History  Procedure Laterality Date  . Craniotomy Left 06/23/2014    Procedure: Left craniotomy for tumor resection;  Surgeon: Newman Pies, MD;  Location: [facility];  Service: Neurosurgery;  Laterality: Left;  Left craniotomy for tumor resection    Assessment & Plan Clinical Impression:   [name] is a 63 y.o. right-handed female with history of diabetes mellitus, small cell lung cancer initially diagnosed 7 years ago and treated with radiation chemotherapy. Patient lives with her brother and reportedly use a walker prior to admission. Admitted 06/18/2014 with altered mental status and recent falls. Cranial CT scan demonstrated large frontal mass. Workup included CT of chest and abdomen and pelvis which were negative. Attempts were made to get brain MRI that she had some sort of metallic object which caused a lot of artifact on the MRI imaging. If old CT of the head demonstrated large frontal and left parietal-occipital mass. Underwent left frontal craniotomy for gross total resection of  frontal brain tumor, left suboccipital craniotomy for gross total resection of left occipital tumor 06/23/2014 per Dr. Arnoldo Morale. Keppra added for seizure prophylaxis. Followup MRI postoperatively 06/24/2014 showing no enhancing marginal tissues seen at the site of resection.  Await oncology followup question plan radiation therapy. Patient remains on Decadron protocol and decreased to 2 mg every 12 hours 06/28/2014. Tolerating a regular diet. Physical and occupational therapy reevaluations completed after craniotomy and noted expressive difficulties and decreasing cognition with recommendations of physical medicine rehabilitation consult. Patient transferred to CIR on 06/28/2014 .  Patient currently requires min and mod-max cues with basic self-care skills secondary to muscle weakness, apraxia and perseverations, decreased initiation, decreased attention, decreased awareness, decreased problem solving, decreased safety awareness, decreased memory and delayed processing and decreased sitting balance, decreased standing balance, decreased postural control, hemiplegia and decreased balance strategies.  Prior to hospitalization, patient with h/o falls for last month or so.  No family available and unsure of patient's prior level of function.  Patient will benefit from skilled intervention to increase independence with basic self-care skills prior to discharge home with care partner.  Anticipate patient will require 24 hour supervision and follow up home health.  OT - End of Session Activity Tolerance: Tolerates 30+ min activity with multiple rests Endurance Deficit: Yes Endurance Deficit Description: requested back to bed following BADL OT Assessment Rehab Potential: Good Barriers to Discharge: Decreased caregiver support Barriers to Discharge Comments: No family present at OT eval-unsure of brother's ability to provide 24/7 supervision recommended OT Patient demonstrates impairments in the following  area(s): Balance;Cognition;Endurance;Safety;Skin Integrity;Perception;Other (Comment) (vision TBA functionally) OT Basic ADL's Functional  Problem(s): Grooming;Bathing;Dressing;Toileting OT Advanced ADL's Functional Problem(s): Light Housekeeping OT Transfers Functional Problem(s): Toilet OT Plan OT Intensity: Minimum of 1-2 x/day, 45 to 90 minutes OT Frequency: 5 out of 7 days OT Duration/Estimated Length of Stay: 12-14 OT Treatment/Interventions: Balance/vestibular training;Cognitive remediation/compensation;Community reintegration;Discharge planning;Functional mobility training;Patient/family education;Psychosocial support;Skin care/wound managment;Self Care/advanced ADL retraining;Therapeutic Activities;Therapeutic Exercise;UE/LE Strength taining/ROM;Visual/perceptual remediation/compensation OT Basic Self-Care Anticipated Outcome(s): Supervision and Mod cognitive cues OT Toileting Anticipated Outcome(s): Supervision and Mod cognitive cues OT Bathroom Transfers Anticipated Outcome(s): Supervision and Mod cognitive cues OT Recommendation Patient destination: Home Follow Up Recommendations: Home health OT;24 hour supervision/assistance Equipment Recommended: None recommended by OT Equipment Details: Patient did not shower PTA, BSC TBA  Skilled Therapeutic Intervention OT evaluation and self care retraining.  Patient sleeping in bed upon arrival.  Engaged in self care retraining to include bathing and dressing. PTA, patient took only sponge baths and reports that she wore pull ups.  The only clothes in patient's room were the ones she wore into the hospital.  Focused session on attention, awareness, problem solving, task initiation, task completion, activity tolerance, dynamic balance, and safety awareness.  Patient required max-mod multi modal cues for all of the above areas of focus.  Patient not very talkative yet did state that she has 2 dogs and she wants to get back to them as soon as  possible, wanted to know why her shirt pocket was empty-then later stated that her brother took home her lighter and cigarettes.  During evaluation, patient became unhappy with the questions being asked therefore much of evaluation was completed during functional tasks.     OT Evaluation Precautions/Restrictions  Precautions Precautions: Fall Restrictions Weight Bearing Restrictions: No Pain Pain Assessment Pain Assessment: No/denies pain Home Living/Prior Functioning Home Living Family/patient expects to be discharged to:: Private residence Living Arrangements: Other relatives Available Help at Discharge: Family;Available PRN/intermittently Type of Home: House Home Access: Stairs to enter [employer] of Steps: 5-6 Entrance Stairs-Rails:  (Pt unable to remember) Home Layout: One level  Lives With: Family Prior Function Level of Independence: Needs assistance with homemaking;Needs assistance with gait;Requires assistive device for independence;Needs assistance with ADLs;Needs assistance with tranfers  Able to Take Stairs?: Yes Driving: No Comments: pt typically sponge bathes- per admission report; pt unable to clearly relay PLOF due to cognitive deficits; hx of falls at home over past month ADL Refer to FIM below for details Vision/Perception  Vision- History Baseline Vision/History: No visual deficits Patient Visual Report: No change from baseline Vision- Assessment Additional Comments: Unable to formally assess due to cognitive deficits.  Unsure of patient's ability to be good historian regarding baseline or current level.  Patient became upset and cursing due to questions asked during screen/assessment.  Cognition Overall Cognitive Status: Impaired/Different from baseline Arousal/Alertness: Awake/alert Orientation Level: Oriented to person;Oriented to place;Oriented to time;Disoriented to situation Attention: Sustained;Selective Focused Attention: Appears  intact Sustained Attention: Impaired Sustained Attention Impairment: Verbal basic;Functional basic Selective Attention: Impaired Selective Attention Impairment: Verbal basic;Functional basic Memory: Impaired Memory Impairment: Decreased short term memory;Decreased recall of new information Decreased Long Term Memory: Verbal basic;Functional basic Decreased Short Term Memory: Verbal basic;Functional basic Awareness: Impaired Awareness Impairment: Intellectual impairment;Emergent impairment Problem Solving: Impaired Problem Solving Impairment: Verbal basic;Functional basic Executive Function: Reasoning;Decision Making;Initiating;Self Monitoring;Self Correcting Reasoning: Impaired Reasoning Impairment: Verbal basic;Functional basic Decision Making: Impaired Decision Making Impairment: Verbal basic;Functional basic Initiating: Impaired Initiating Impairment: Verbal basic;Functional basic Self Monitoring: Impaired Self Monitoring Impairment: Verbal basic;Functional basic Self Correcting: Impaired Self Correcting Impairment: Verbal basic;Functional basic  Behaviors: Verbal agitation;Perseveration;Poor frustration tolerance Safety/Judgment: Impaired Sensation Sensation Light Touch: Appears Intact Proprioception: Appears Intact Coordination Gross Motor Movements are Fluid and Coordinated: Yes Fine Motor Movements are Fluid and Coordinated: Yes Motor  Motor Motor: Hemiplegia Motor - Skilled Clinical Observations: mild R hemiplegia Mobility  Bed Mobility Bed Mobility: Supine to Sit;Rolling Left;Rolling Right Rolling Right: 5: Supervision;With rail Rolling Right Details: Verbal cues for technique Rolling Left: 5: Supervision;With rail Rolling Left Details: Verbal cues for technique Supine to Sit: 4: Min assist Supine to Sit Details: Verbal cues for technique;Tactile cues for placement Transfers Sit to Stand: 4: Min assist;With armrests;From bed;From chair/3-in-1 Sit to Stand  Details: Verbal cues for precautions/safety;Verbal cues for safe use of DME/AE Stand to Sit: 4: Min assist;With armrests;To bed;To chair/3-in-1 Stand to Sit Details (indicate cue type and reason): Verbal cues for precautions/safety;Verbal cues for safe use of DME/AE  Trunk/Postural Assessment  Cervical Assessment Cervical Assessment: Exceptions to [facility] (forward head) Thoracic Assessment Thoracic Assessment: Exceptions to [facility] (mild flexion) Lumbar Assessment Lumbar Assessment: Exceptions to [facility] (posterior tilt) Postural Control Postural Control: Deficits on evaluation Righting Reactions: significantly delayed in standing, especially without use of RW  Balance Standardized Balance Assessment: Berg Balance Test (performed by PT) Merrilee Jansky Balance Test Sit to Stand: Needs minimal aid to stand or to stabilize Standing Unsupported: Unable to stand 30 seconds unassisted Sitting with Back Unsupported but Feet Supported on Floor or Stool: Able to sit 2 minutes under supervision Stand to Sit: Controls descent by using hands Transfers: Needs one person to assist Standing Unsupported with Eyes Closed: Needs help to keep from falling Standing Ubsupported with Feet Together: Needs help to attain position and unable to hold for 15 seconds From Standing, Reach Forward with Outstretched Arm: Loses balance while trying/requires external support From Standing Position, Pick up Object from Floor: Unable to try/needs assist to keep balance From Standing Position, Turn to Look Behind Over each Shoulder: Needs assist to keep from losing balance and falling Turn 360 Degrees: Needs assistance while turning Standing Unsupported, Alternately Place Feet on Step/Stool: Needs assistance to keep from falling or unable to try Standing Unsupported, One Foot in Front: Loses balance while stepping or standing Standing on One Leg: Unable to try or needs assist to prevent fall Total Score: 8 Extremity/Trunk Assessment RUE  Assessment RUE Assessment: Not tested [facility] during BADL tasks and basic mobility) LUE Assessment LUE Assessment: Not tested [facility] during BADL tasks and baskc mobility)  FIM:  FIM - Grooming Grooming Steps: Wash, rinse, dry face;Wash, rinse, dry hands Grooming: 4: Patient completes 3 of 4 or 4 of 5 steps (multi-modal cues) FIM - Bathing Bathing Steps Patient Completed: Chest;Right Arm;Left Arm;Abdomen;Right upper leg;Left upper leg;Left lower leg (including foot);Front perineal area Bathing: 4: Min-Patient completes 8-9 7f 10 parts or 75+ percent (sit-stand) FIM - Upper Body Dressing/Undressing Upper body dressing/undressing steps patient completed: Pull shirt over trunk;Put head through opening of pull over shirt/dress;Thread/unthread left sleeve of pullover shirt/dress;Thread/unthread right sleeve of pullover shirt/dresss Upper body dressing/undressing: 5: Set-up assist to: Obtain clothing/put away FIM - Lower Body Dressing/Undressing Lower body dressing/undressing steps patient completed: Don/Doff right shoe;Don/Doff left shoe;Thread/unthread right pants leg;Thread/unthread left pants leg;Pull pants up/down Lower body dressing/undressing: 3: Mod-Patient completed 50-74% of tasks FIM - Control and instrumentation engineer Devices: Arm rests;Walker Bed/Chair Transfer: 4: Supine > Sit: Min A (steadying Pt. > 75%/lift 1 leg);4: Bed > Chair or W/C: Min A (steadying Pt. > 75%);4: Chair or W/C > Bed: Min A (steadying  Pt. > 75%) FIM - Air cabin crew Transfers Assistive Devices: Elevated toilet seat;Grab bars;Walker Toilet Transfers: 4-To toilet/BSC: Min A (steadying Pt. > 75%);4-From toilet/BSC: Min A (steadying Pt. > 75%) FIM - Tub/Shower Transfers Tub/shower Transfers: 0-Activity did not occur or was simulated (patient sponge bathes at home)   Refer to Care Plan for Long Term Goals  Recommendations for other services: None  Discharge Criteria: Patient will be discharged  from OT if patient refuses treatment 3 consecutive times without medical reason, if treatment goals not met, if there is a change in medical status, if patient makes no progress towards goals or if patient is discharged from hospital.  The above assessment, treatment plan, treatment alternatives and goals were discussed and mutually agreed upon: No family available/patient unable  [place], [place] 06/29/2014, 4:41 PM

## 2014-06-29 NOTE — Progress Notes (Signed)
  [place] [facility]     PROGRESS NOTE    Subjective/Complaints: Had a good night. Denies pain. No sob, cp, diarrhea, n/v/  Objective: Vital Signs: Blood pressure 117/59, pulse 72, temperature 98.7 F (37.1 C), temperature source Oral, resp. rate 18, weight 56.926 kg (125 lb 8 oz), SpO2 98.00%. No results found.  Recent Labs  06/29/14 0640  WBC 12.6*  HGB 13.9  HCT 39.3  PLT 194    Recent Labs  06/29/14 0640  NA 142  K 4.3  CL 102  GLUCOSE 82  BUN 15  CREATININE 0.49*  CALCIUM 8.3*   CBG (last 3)   Recent Labs  06/28/14 1653 06/28/14 2137 06/29/14 0705  GLUCAP 118* 90 93    Wt Readings from Last 3 Encounters:  06/28/14 56.926 kg (125 lb 8 oz)  06/23/14 59.1 kg (130 lb 4.7 oz)  06/23/14 59.1 kg (130 lb 4.7 oz)    Physical Exam:  General: no distress. Head shaven on left  Eyes: EOM are normal  HEENT: edentulous, oral mucosa pink/moist.  Neck: Normal range of motion. Neck supple. No thyromegaly present.  Cardiovascular: Normal rate and regular rhythm. Systolic murmur  Respiratory: Effort normal and breath sounds normal. No respiratory distress.  GI: Soft. Bowel sounds are normal. She exhibits no distension.  Neurological:  Patient is alert and makes good eye contact with examiner. Continued delay in processing, better with cueing. She was able to provide her name, age and date of birth. She has difficulty geographical information. Sometimes echolalic. Follows simple commands. Visual acuity fair. Seems to scan to all visual fields. Strength 4/5 bilateral deltoid, bicep, tricep, HI. LE: 3/5 HF, 4- KE and 4- to 4/5 with ADF/APF. Sensation? Diminished RLE----inconsistent. DTR's 1+.  Skin:  Craniotomy site with staples intact   Assessment/Plan: 1. Functional deficits secondary to metastatic lung cancer to the brain which require 3+ hours per day of interdisciplinary therapy in a comprehensive inpatient rehab setting. Physiatrist is  providing close team supervision and 24 hour management of active medical problems listed below. Physiatrist and rehab team continue to assess barriers to discharge/monitor patient progress toward functional and medical goals. FIM:                         Social Interaction Social Interaction Mode: Asleep  Problem Solving Problem Solving Mode: Asleep  Memory Memory Mode: Asleep  Medical Problem List and Plan:  1. 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  2. DVT Prophylaxis/Anticoagulation: SCDs. Monitor for any signs of DVT  3. Pain Management: Hydrocodone as needed. Monitor with increased mobility  4. Mood/bipolar disorder: Risperdal 4 mg each bedtime. Mood stable at present 5. Neuropsych: This patient is not capable of making decisions on her own behalf.  6. Skin/Wound Care: Routine skin checks monitor craniotomy incision  7. Fluids/Electrolytes/Nutrition:   Provide nutritional supplements as needed   -labwork and elecrolytes within normal range this morning 8. Seizure prophylaxis. Keppra 500 mg twice a day  9. Diabetes mellitus. Glucophage 1000 mg twice a day.   -sugars remain reasonable  -decadron taper LOS (Days) 1 A FACE TO FACE EVALUATION WAS PERFORMED  SWARTZ,ZACHARY T 06/29/2014 9:11 AM

## 2014-06-29 NOTE — Progress Notes (Signed)
Patient information reviewed and entered into eRehab system by Sianni Cloninger, RN, CRRN, PPS Coordinator.  Information including medical coding and functional independence measure will be reviewed and updated through discharge.    

## 2014-06-29 NOTE — Progress Notes (Signed)
Patient's blood pressure manually was 94/58.  Patient is asymptomatic.  Algis Liming, PA notified of patient's blood pressure and advises to continue to monitor the patient and recheck blood pressure.  Will continue to monitor.

## 2014-06-30 ENCOUNTER — Inpatient Hospital Stay (HOSPITAL_COMMUNITY): Payer: Medicaid Other

## 2014-06-30 ENCOUNTER — Inpatient Hospital Stay (HOSPITAL_COMMUNITY): Payer: Medicaid Other | Admitting: Speech Pathology

## 2014-06-30 ENCOUNTER — Inpatient Hospital Stay (HOSPITAL_COMMUNITY): Payer: Medicaid Other | Admitting: *Deleted

## 2014-06-30 DIAGNOSIS — I619 Nontraumatic intracerebral hemorrhage, unspecified: Secondary | ICD-10-CM

## 2014-06-30 LAB — GLUCOSE, CAPILLARY
GLUCOSE-CAPILLARY: 83 mg/dL (ref 70–99)
Glucose-Capillary: 90 mg/dL (ref 70–99)
Glucose-Capillary: 134 mg/dL — ABNORMAL HIGH (ref 70–99)
Glucose-Capillary: 77 mg/dL (ref 70–99)

## 2014-06-30 LAB — POCT I-STAT 4, (NA,K, GLUC, HGB,HCT)
GLUCOSE: 124 mg/dL — AB (ref 70–99)
HCT: 41 % (ref 36.0–46.0)
HEMOGLOBIN: 13.9 g/dL (ref 12.0–15.0)
Potassium: 4.1 mEq/L (ref 3.7–5.3)
Sodium: 137 mEq/L (ref 137–147)

## 2014-06-30 SURGERY — CRANIOTOMY TUMOR EXCISION
Anesthesia: General | Laterality: Left

## 2014-06-30 NOTE — Progress Notes (Signed)
Speech Language Pathology Daily Session Note  Patient Details  Name: [name] MRN: [id number] Date of Birth: 20-Apr-1951  Today's Date: 06/30/2014 SLP Individual Time: 1030-1130 SLP Individual Time Calculation (min): 60 min  Short Term Goals: Week 1: SLP Short Term Goal 1 (Week 1): Patient will consume least restrictive diet with Max A multimodal cues for utilization of swallowing compensatory strategies to minimize overt s/s of aspiration. SLP Short Term Goal 2 (Week 1): Pt will increase overall speech intelligibility by increased vocal intensity and over-articulation at the phrase level with Max A multimodal cues.  SLP Short Term Goal 3 (Week 1): Pt will use call bell to request assistance with Max A multimodal cues.  SLP Short Term Goal 4 (Week 1): Pt will sustain attention to basic functional tasks for 3 minutes with Max A multimodal cues.  SLP Short Term Goal 5 (Week 1): Pt will demonstrate functional problem solving for basic and familiar tasks with Max A multimodal cues.  SLP Short Term Goal 6 (Week 1): Pt will utilize external visual aids to recall new, daily information with Max A multimodal cues.   Skilled Therapeutic Interventions: Skilled treatment session focused on diagnostic treatment of the patient's functional language. SLP facilitated the session by providing Max A for attention to a functional task/conversation for ~1 turn.  Patient was able to name pictures of familiar items with 45% accuracy and complete convergent naming and category exclusion tasks with 50% accuracy. Patient also performed divergent naming tasks with 0% accuracy despite Max A multimodal cues. Patient's verbal expression is characterized by neologisms and perseveration and patient also utilizes cuss words for "filler" words when she is demonstrating difficulty naming. Patient was unable to verbalize current living situation but was able to answer yes/no questions accurately in regards to biographical  information.  Patient left in chair with quick release in place. Continue with current plan of care.    FIM:  Comprehension Comprehension Mode: Auditory Comprehension: 2-Understands basic 25 - 49% of the time/requires cueing 51 - 75% of the time Expression Expression Mode: Verbal Expression: 2-Expresses basic 25 - 49% of the time/requires cueing 50 - 75% of the time. Uses single words/gestures. Social Interaction Social Interaction: 2-Interacts appropriately 25 - 49% of time - Needs frequent redirection. Problem Solving Problem Solving: 1-Solves basic less than 25% of the time - needs direction nearly all the time or does not effectively solve problems and may need a restraint for safety Memory Memory: 2-Recognizes or recalls 25 - 49% of the time/requires cueing 51 - 75% of the time  Pain Pain Assessment Pain Assessment: No/denies pain  Therapy/Group: Individual Therapy  [name] 06/30/2014, 12:17 PM

## 2014-06-30 NOTE — Progress Notes (Addendum)
Occupational Therapy Session Note  Patient Details  Name: [name] MRN: [id number] Date of Birth: 1950/12/06  Today's Date: 06/30/2014 OT Individual Time: 0900-1000 OT Individual Time Calculation (min): 60 min    Short Term Goals: Week 1:  OT Short Term Goal 1 (Week 1): Sponge Bath: Supervision to include sit and stand with no more than 75% cueing  OT Short Term Goal 2 (Week 1): LB Dressing:  Supervision to include sit and stand with no more than 75% cueing  OT Short Term Goal 3 (Week 1): Toilet transfer: Supervision to include sit and stand with no more than 75% cueing  OT Short Term Goal 4 (Week 1): Toileting: Supervision to include sit and stand with no more than 75% cueing  OT Short Term Goal 5 (Week 1): Caregiver Education:  Min assist providing multi-modal cues during BADL tasks  Skilled Therapeutic Interventions/Progress Updates:  Pt resting in bed upon arrival and agreeable to participating in therapy.  Pt engaged in BADL retraining including bathing and dressing with sit<>stand from w/c at sink.  When questioned, pt stated she needed to use toilet.  Upon completion, pt stood up unassisted without notifying patient she was finished.  Pt required max verbal cues throughout bathing and dressing tasks to initiate and complete tasks.  Pt required max verbal cues for redirection to task.  Attempted to assess vision, but patient unable to follow commands.  Pt exhibited som apraxia during bathing tasks.  Focus on activity tolerance, transfers, sit<>stand, task initiation, following one step commands, sequencing, attention to task, and safety awareness. Therapy Documentation Precautions:  Precautions Precautions: Fall Restrictions Weight Bearing Restrictions: No   Pain: Pain Assessment Pain Assessment: No/denies pain  See FIM for current functional status  Therapy/Group: Individual Therapy  Leroy Libman 06/30/2014, 10:02 AM

## 2014-06-30 NOTE — Progress Notes (Signed)
Nutrition Brief Note  Patient identified on the Malnutrition Screening Tool (MST) Report  Wt Readings from Last 15 Encounters:  06/29/14 123 lb 14.4 oz (56.201 kg)  06/23/14 130 lb 4.7 oz (59.1 kg)  06/23/14 130 lb 4.7 oz (59.1 kg)  12/19/06 178 lb 4.8 oz (80.876 kg)    Body mass index is 23.42 kg/(m^2). Patient meets criteria for normal based on current BMI. Pt reports her usual body weight is around 125 lbs. Pt's appetite is good. PTA pt reports eating well with no difficulties.  Current diet order is regular, patient is consuming approximately 75-100% of meals at this time. Labs and medications reviewed.   No nutrition interventions warranted at this time. If nutrition issues arise, please consult RD.   Kallie Locks, MS, RD, LDN Pager # [phone] After hours/ weekend pager # [phone]

## 2014-06-30 NOTE — Progress Notes (Signed)
Physical Therapy Session Note  Patient Details  Name: [name] MRN: [id number] Date of Birth: 27-Sep-1950  Today's Date: 06/30/2014 PT Individual Time: 1600-1700 PT Individual Time Calculation (min): 60 min   Short Term Goals: Week 1:  PT Short Term Goal 1 (Week 1): Pt to perform bed mobility in a standard bed with supervision PT Short Term Goal 2 (Week 1): Pt to perform transfer with use of LRAD and supervision PT Short Term Goal 3 (Week 1): Pt to maintain standing balance without AD with min A PT Short Term Goal 4 (Week 1): Pt to ambulate 150' with RW and supervision, 50% of time PT Short Term Goal 5 (Week 1): Pt to negotiate up/down 6 steps with single rail and min guard A  Skilled Therapeutic Interventions/Progress Updates:    Patient received sitting in wheelchair. Session focused on functional transfers, dynamic standing balance, and cognitive remediation with emphasis on initiation, sustained attention, and command following. Standing dynamic balance tasks of : -reaching outside BOS for horseshoe toss and retrieval from floor with minA overall with emphasis on following commands and identifying colors of horseshoes; patient at mod I level for color identification. -sit<>stand with ball toss/catch after each stand with letter ball with emphasis on generative naming task when given specific letter; patient requires mod cues overall for task.  Patient returned to room and left sitting in wheelchair with seatbelt donned and all needs within reach.  Therapy Documentation Precautions:  Precautions Precautions: Fall Precaution Comments: due to instability in standing.  Restrictions Weight Bearing Restrictions: No Pain: Pain Assessment Pain Assessment: No/denies pain Pain Score: 0-No pain Locomotion : Ambulation Ambulation/Gait Assistance: Not tested (comment)   See FIM for current functional status  Therapy/Group: Individual Therapy  [name]. [name], PT,  DPT 06/30/2014, 5:23 PM

## 2014-06-30 NOTE — Progress Notes (Signed)
[facility] PHYSICAL MEDICINE & REHABILITATION     PROGRESS NOTE    Subjective/Complaints: No complaints. Denies h/a or other pain. No sob, cough  Objective: Vital Signs: Blood pressure 97/69, pulse 83, temperature 98.7 F (37.1 C), temperature source Oral, resp. rate 18, weight 56.201 kg (123 lb 14.4 oz), SpO2 97.00%. No results found.  Recent Labs  06/29/14 0640  WBC 12.6*  HGB 13.9  HCT 39.3  PLT 194    Recent Labs  06/29/14 0640  NA 142  K 4.3  CL 102  GLUCOSE 82  BUN 15  CREATININE 0.49*  CALCIUM 8.3*   CBG (last 3)   Recent Labs  06/29/14 1653 06/29/14 2131 06/30/14 0700  GLUCAP 105* 92 83    Wt Readings from Last 3 Encounters:  06/29/14 56.201 kg (123 lb 14.4 oz)  06/23/14 59.1 kg (130 lb 4.7 oz)  06/23/14 59.1 kg (130 lb 4.7 oz)    Physical Exam:  General: no distress. Head shaven on left  Eyes: EOM are normal  HEENT: edentulous, oral mucosa pink/moist.  Neck: Normal range of motion. Neck supple. No thyromegaly present.  Cardiovascular: Normal rate and regular rhythm. Systolic murmur  Respiratory: Effort normal and breath sounds normal. No respiratory distress.  GI: Soft. Bowel sounds are normal. She exhibits no distension.  Neurological:  Patient is alert and makes good eye contact with examiner. Continued delay in processing, better with cueing. She was able to provide her name, age and date of birth. She has difficulty geographical information. remains echolalic. Follows simple commands. Visual acuity fair. Seems to scan to all visual fields. Strength 4/5 bilateral deltoid, bicep, tricep, HI. LE: 3/5 HF, 4- KE and 4- to 4/5 with ADF/APF. Sensation? Diminished RLE----inconsistent. DTR's 1+.  Skin:  Craniotomy site with staples intact   Assessment/Plan: 1. Functional deficits secondary to metastatic lung cancer to the brain which require 3+ hours per day of interdisciplinary therapy in a comprehensive inpatient rehab setting. Physiatrist is  providing close team supervision and 24 hour management of active medical problems listed below. Physiatrist and rehab team continue to assess barriers to discharge/monitor patient progress toward functional and medical goals. FIM: FIM - Bathing Bathing Steps Patient Completed: Chest;Right Arm;Left Arm;Abdomen;Right upper leg;Left upper leg;Left lower leg (including foot);Front perineal area Bathing: 4: Min-Patient completes 8-9 53f 10 parts or 75+ percent (sit-stand)  FIM - Upper Body Dressing/Undressing Upper body dressing/undressing steps patient completed: Pull shirt over trunk;Put head through opening of pull over shirt/dress;Thread/unthread left sleeve of pullover shirt/dress;Thread/unthread right sleeve of pullover shirt/dresss Upper body dressing/undressing: 5: Set-up assist to: Obtain clothing/put away FIM - Lower Body Dressing/Undressing Lower body dressing/undressing steps patient completed: Don/Doff right shoe;Don/Doff left shoe;Thread/unthread right pants leg;Thread/unthread left pants leg;Pull pants up/down Lower body dressing/undressing: 3: Mod-Patient completed 50-74% of tasks     FIM - Radio producer Devices: Elevated toilet seat;Grab bars;Walker Toilet Transfers: 4-To toilet/BSC: Min A (steadying Pt. > 75%);4-From toilet/BSC: Min A (steadying Pt. > 75%)  FIM - Bed/Chair Transfer Bed/Chair Transfer Assistive Devices: Arm rests;Walker Bed/Chair Transfer: 4: Supine > Sit: Min A (steadying Pt. > 75%/lift 1 leg);4: Bed > Chair or W/C: Min A (steadying Pt. > 75%);4: Chair or W/C > Bed: Min A (steadying Pt. > 75%)  FIM - Locomotion: Wheelchair Distance: 100' Locomotion: Wheelchair: 2: Travels 50 - 149 ft with moderate assistance (Pt: 50 - 74%) FIM - Locomotion: Ambulation Locomotion: Ambulation Assistive Devices: Administrator Ambulation/Gait Assistance: 4: Min assist Locomotion: Ambulation:  2: Travels 50 - 149 ft with minimal assistance  (Pt.>75%)  Comprehension Comprehension Mode: Auditory Comprehension: 2-Understands basic 25 - 49% of the time/requires cueing 51 - 75% of the time  Expression Expression Mode: Verbal Expression: 2-Expresses basic 25 - 49% of the time/requires cueing 50 - 75% of the time. Uses single words/gestures.  Social Interaction Social Interaction Mode: Asleep Social Interaction: 1-Interacts appropriately less than 25% of the time. May be withdrawn or combative.  Problem Solving Problem Solving Mode: Asleep Problem Solving: 1-Solves basic less than 25% of the time - needs direction nearly all the time or does not effectively solve problems and may need a restraint for safety  Memory Memory Mode: Asleep Memory: 2-Recognizes or recalls 25 - 49% of the time/requires cueing 51 - 75% of the time  Medical Problem List and Plan:  1. 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  2. DVT Prophylaxis/Anticoagulation: SCDs. Monitor for any signs of DVT  3. Pain Management: Hydrocodone as needed. Monitor with increased mobility  4. Mood/bipolar disorder: Risperdal 4 mg each bedtime. Mood stable at present 5. Neuropsych: This patient is not capable of making decisions on her own behalf.  6. Skin/Wound Care: Routine skin checks monitor craniotomy incision  7. Fluids/Electrolytes/Nutrition:   Provide nutritional supplements as needed   -labwork and elecrolytes within normal range thus far 8. Seizure prophylaxis. Keppra 500 mg twice a day  9. Diabetes mellitus. Glucophage 1000 mg twice a day.   -sugars remain reasonable  -decadron taper LOS (Days) 2 A FACE TO FACE EVALUATION WAS PERFORMED  Almer Bushey T 06/30/2014 9:31 AM

## 2014-07-01 ENCOUNTER — Encounter (HOSPITAL_COMMUNITY): Payer: Medicaid Other

## 2014-07-01 ENCOUNTER — Inpatient Hospital Stay (HOSPITAL_COMMUNITY): Payer: Medicaid Other | Admitting: *Deleted

## 2014-07-01 ENCOUNTER — Inpatient Hospital Stay (HOSPITAL_COMMUNITY): Payer: Medicaid Other | Admitting: Speech Pathology

## 2014-07-01 DIAGNOSIS — E119 Type 2 diabetes mellitus without complications: Secondary | ICD-10-CM

## 2014-07-01 LAB — GLUCOSE, CAPILLARY
GLUCOSE-CAPILLARY: 99 mg/dL (ref 70–99)
GLUCOSE-CAPILLARY: 94 mg/dL (ref 70–99)
Glucose-Capillary: 191 mg/dL — ABNORMAL HIGH (ref 70–99)
Glucose-Capillary: 101 mg/dL — ABNORMAL HIGH (ref 70–99)

## 2014-07-01 MED ORDER — METFORMIN HCL 500 MG PO TABS
500.0000 mg | ORAL_TABLET | Freq: Two times a day (BID) | ORAL | Status: DC
  Administered 2014-07-01 – 2014-07-04 (×7): 500 mg via ORAL
  Filled 2014-07-01 (×10): qty 1

## 2014-07-01 NOTE — Progress Notes (Signed)
Physical Therapy Session Note  Patient Details  Name: [name] MRN: [id number] Date of Birth: 1951-04-03  Today's Date: 07/01/2014 PT Individual Time: 0900-1000 PT Individual Time Calculation (min): 60 min   Short Term Goals: Week 1:  PT Short Term Goal 1 (Week 1): Pt to perform bed mobility in a standard bed with supervision PT Short Term Goal 2 (Week 1): Pt to perform transfer with use of LRAD and supervision PT Short Term Goal 3 (Week 1): Pt to maintain standing balance without AD with min A PT Short Term Goal 4 (Week 1): Pt to ambulate 150' with RW and supervision, 50% of time PT Short Term Goal 5 (Week 1): Pt to negotiate up/down 6 steps with single rail and min guard A  Skilled Therapeutic Interventions/Progress Updates:    Patient received sitting in wheelchair. Session focused on initiation, sustained attention, and command following with functional mobility. PLEASE SEE BELOW FOR TUG DETAILS. NuStep Level 3 x3', decreased to Level 1x5' for warm up activity and to improved sustained attention to activity. Patient requires max cues for sustained attention to task and re-initiation of task. Gait training in controlled environment 125' x2 with R HHA and minA. Stair negotiation x4 stairs with R rail ascending, L rails descending; negotiates sideways with step to pattern and minA, min cues for sequencing. Patient with mild verbal agitation when cued to only use one rail, but easily redirected.  Patient essentially nonverbal throughout session with periods of lethargy and eyes closed. Patient returned to room and left sitting in wheelchair with seatbelt donned and all needs within reach.  Therapy Documentation Precautions:  Precautions Precautions: Fall Precaution Comments: due to instability in standing.  Restrictions Weight Bearing Restrictions: No Pain: Pain Assessment Pain Assessment: No/denies pain Pain Score: 0-No pain Balance: Standardized Balance Assessment Standardized  Balance Assessment: Timed Up and Go Test Timed Up and Go Test TUG: Normal TUG Normal TUG (seconds): 55.74 (average of 3 trials); attention to task and external distractability are likely contributing factors to patient's score. Additionally, patient tending to make very wide turn around cone, despite cues, which may have effected score as well.  See FIM for current functional status  Therapy/Group: Individual Therapy  Lillia Abed. Lisandro Meggett, PT, DPT 07/01/2014, 10:00 AM

## 2014-07-01 NOTE — Progress Notes (Signed)
[facility] PHYSICAL MEDICINE & REHABILITATION     PROGRESS NOTE    Subjective/Complaints: No complaints. Slept well. No problems over night  Objective: Vital Signs: Blood pressure 96/52, pulse 85, temperature 97.9 F (36.6 C), temperature source Oral, resp. rate 18, height 5\' 1"  (1.549 m), weight 56.065 kg (123 lb 9.6 oz), SpO2 98.00%. No results found.  Recent Labs  06/29/14 0640  WBC 12.6*  HGB 13.9  HCT 39.3  PLT 194    Recent Labs  06/29/14 0640  NA 142  K 4.3  CL 102  GLUCOSE 82  BUN 15  CREATININE 0.49*  CALCIUM 8.3*   CBG (last 3)   Recent Labs  06/30/14 1658 06/30/14 2045 07/01/14 0550  GLUCAP 134* 77 99    Wt Readings from Last 3 Encounters:  07/01/14 56.065 kg (123 lb 9.6 oz)  06/23/14 59.1 kg (130 lb 4.7 oz)  06/23/14 59.1 kg (130 lb 4.7 oz)    Physical Exam:  General: no distress. Head shaven on left  Eyes: EOM are normal  HEENT: edentulous, oral mucosa pink/moist.  Neck: Normal range of motion. Neck supple. No thyromegaly present.  Cardiovascular: Normal rate and regular rhythm. Systolic murmur  Respiratory: Effort normal and breath sounds normal. No respiratory distress.  GI: Soft. Bowel sounds are normal. She exhibits no distension.  Neurological:  Patient is alert and makes good eye contact with examiner. Continued delay in processing, better with cueing. She was able to provide her name, age and date of birth. She has difficulty geographical information. remains echolalic. Follows simple commands. Visual acuity fair. Seems to scan to all visual fields. Strength 4/5 bilateral deltoid, bicep, tricep, HI. LE: 3/5 HF, 4- KE and 4- to 4/5 with ADF/APF. Sensation? Diminished RLE----inconsistent. DTR's 1+.  Skin:  Craniotomy site with staples intact   Assessment/Plan: 1. Functional deficits secondary to metastatic lung cancer to the brain which require 3+ hours per day of interdisciplinary therapy in a comprehensive inpatient rehab  setting. Physiatrist is providing close team supervision and 24 hour management of active medical problems listed below. Physiatrist and rehab team continue to assess barriers to discharge/monitor patient progress toward functional and medical goals. FIM: FIM - Bathing Bathing Steps Patient Completed: Chest;Right Arm;Left Arm;Abdomen;Right upper leg;Left upper leg;Front perineal area;Buttocks Bathing: 4: Min-Patient completes 8-9 40f 10 parts or 75+ percent  FIM - Upper Body Dressing/Undressing Upper body dressing/undressing steps patient completed: Thread/unthread right sleeve of front closure shirt/dress;Thread/unthread left sleeve of front closure shirt/dress;Pull shirt around back of front closure shirt/dress Upper body dressing/undressing: 4: Min-Patient completed 75 plus % of tasks FIM - Lower Body Dressing/Undressing Lower body dressing/undressing steps patient completed: Don/Doff right shoe;Don/Doff left shoe;Thread/unthread right pants leg;Thread/unthread left pants leg;Pull pants up/down Lower body dressing/undressing: 3: Mod-Patient completed 50-74% of tasks  FIM - Toileting Toileting steps completed by patient: Performs perineal hygiene;Adjust clothing prior to toileting;Adjust clothing after toileting Toileting: 4: Steadying assist  FIM - Air cabin crew Transfers Assistive Devices: Elevated toilet seat;Grab bars;Walker Toilet Transfers: 4-To toilet/BSC: Min A (steadying Pt. > 75%);4-From toilet/BSC: Min A (steadying Pt. > 75%)  FIM - Bed/Chair Transfer Bed/Chair Transfer Assistive Devices: Arm rests Bed/Chair Transfer: 4: Bed > Chair or W/C: Min A (steadying Pt. > 75%);4: Chair or W/C > Bed: Min A (steadying Pt. > 75%)  FIM - Locomotion: Wheelchair Distance: 100' Locomotion: Wheelchair: 1: Total Assistance/staff pushes wheelchair (Pt<25%) FIM - Locomotion: Ambulation Locomotion: Ambulation Assistive Devices: Walker - Rolling Ambulation/Gait Assistance: Not tested  (comment) Locomotion:  Ambulation: 0: Activity did not occur  Comprehension Comprehension Mode: Auditory Comprehension: 2-Understands basic 25 - 49% of the time/requires cueing 51 - 75% of the time  Expression Expression Mode: Verbal Expression: 2-Expresses basic 25 - 49% of the time/requires cueing 50 - 75% of the time. Uses single words/gestures.  Social Interaction Social Interaction Mode: Asleep Social Interaction: 2-Interacts appropriately 25 - 49% of time - Needs frequent redirection.  Problem Solving Problem Solving Mode: Asleep Problem Solving: 1-Solves basic less than 25% of the time - needs direction nearly all the time or does not effectively solve problems and may need a restraint for safety  Memory Memory Mode: Asleep Memory: 2-Recognizes or recalls 25 - 49% of the time/requires cueing 51 - 75% of the time  Medical Problem List and Plan:  1. 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  2. DVT Prophylaxis/Anticoagulation: SCDs. Monitor for any signs of DVT  3. Pain Management: Hydrocodone as needed. Monitor with increased mobility  4. Mood/bipolar disorder: Risperdal 4 mg each bedtime. Mood stable at present 5. Neuropsych: This patient is not capable of making decisions on her own behalf.  6. Skin/Wound Care: Routine skin checks monitor craniotomy incision  7. Fluids/Electrolytes/Nutrition:   Provide nutritional supplements as needed   -labwork and elecrolytes within normal range thus far 8. Seizure prophylaxis. Keppra 500 mg twice a day  9. Diabetes mellitus. Glucophage 1000 mg twice a day.   -sugars low, will decrease to 500mg  for now  -decadron taper LOS (Days) 3 A FACE TO FACE EVALUATION WAS PERFORMED  SWARTZ,ZACHARY T 07/01/2014 9:40 AM

## 2014-07-01 NOTE — IPOC Note (Signed)
Overall Plan of Care [facility]) Patient Details Name: [name] MRN: [id number] DOB: 30-Jul-1951  Admitting Diagnosis: brain tumor resection  lung   Hospital Problems: Active Problems:   Brain tumor     Functional Problem List: Nursing Behavior;Bowel;Bladder;Medication Management;Motor;Nutrition;Perception;Safety;Sensory;Skin Integrity  PT Balance;Endurance;Motor;Safety;Skin Integrity;Other (comment);Perception (strength and cognition)  OT Balance;Cognition;Endurance;Safety;Skin Integrity;Perception;Other (Comment) (vision TBA functionally)  SLP Cognition;Safety  TR         Basic ADL's: OT Grooming;Bathing;Dressing;Toileting     Advanced  ADL's: OT Light Housekeeping     Transfers: PT Bed Mobility;Bed to Chair;Car;Furniture;Floor  OT Toilet     Locomotion: PT Ambulation;Wheelchair Mobility;Stairs     Additional Impairments: OT    SLP Social Cognition   Social Interaction;Problem Solving;Memory;Attention;Awareness  TR      Anticipated Outcomes Item Anticipated Outcome  Self Feeding    Swallowing  Mod A for utilization of swallowing compensatory strategies   Basic self-care  Supervision and Mod cognitive cues  Toileting  Supervision and Mod cognitive cues   Bathroom Transfers Supervision and Mod cognitive cues  Bowel/Bladder  Continent of bowel and bladder 75% of the time  Transfers  Supervision overall  Locomotion  Supevision overall  Communication  Mod A   Cognition  Mod A  Pain  No c/o of pain  Safety/Judgment  No falls noted at discharge   Therapy Plan: PT Intensity: Minimum of 1-2 x/day ,45 to 90 minutes PT Frequency: 5 out of 7 days PT Duration Estimated Length of Stay: 12-14days OT Intensity: Minimum of 1-2 x/day, 45 to 90 minutes OT Frequency: 5 out of 7 days OT Duration/Estimated Length of Stay: 12-14 SLP Intensity: Minumum of 1-2 x/day, 30 to 90 minutes SLP Frequency: 5 out of 7 days SLP Duration/Estimated Length of Stay: 12-14 days        Team Interventions: Nursing Interventions Patient/Family Education;Bladder Management;Bowel Management;Disease Management/Prevention;Cognitive Remediation/Compensation;Skin Care/Wound Management;Medication Management;Psychosocial Support  PT interventions Ambulation/gait training;DME/adaptive equipment instruction;Neuromuscular re-education;Community reintegration;Psychosocial support;Stair training;UE/LE Strength taining/ROM;Wheelchair propulsion/positioning;UE/LE Coordination activities;Therapeutic Activities;Skin care/wound management;Pain management;Discharge planning;Balance/vestibular training;Cognitive remediation/compensation;Disease management/prevention;Functional mobility training;Patient/family education;Splinting/orthotics;Therapeutic Exercise;Visual/perceptual remediation/compensation  OT Interventions Balance/vestibular training;Cognitive remediation/compensation;Community reintegration;Discharge planning;Functional mobility training;Patient/family education;Psychosocial support;Skin care/wound managment;Self Care/advanced ADL retraining;Therapeutic Activities;Therapeutic Exercise;UE/LE Strength taining/ROM;Visual/perceptual remediation/compensation  SLP Interventions Cognitive remediation/compensation;Cueing hierarchy;Internal/external aids;Environmental controls;Therapeutic Activities;Functional tasks  TR Interventions    SW/CM Interventions Discharge Planning;Psychosocial Support;Patient/Family Education    Team Discharge Planning: Destination: PT-Home ,OT- Home , SLP-Home Projected Follow-up: PT-Home health PT, OT-  Home health OT;24 hour supervision/assistance, SLP-24 hour supervision/assistance;Outpatient SLP Projected Equipment Needs: PT-To be determined, OT- None recommended by OT, SLP-None recommended by SLP Equipment Details: PT- , OT-Patient did not shower PTA, BSC TBA Patient/family involved in discharge planning: PT- Patient,  OT-Patient unable/family or caregiver  not available, SLP-Patient unable/family or caregive not available  MD ELOS: 12-14 days Medical Rehab Prognosis:  Excellent Assessment: The patient has been admitted for CIR therapies with the diagnosis of metastatic lung ca to the brain. The team will be addressing functional mobility, strength, stamina, balance, safety, adaptive techniques and equipment, self-care, bowel and bladder mgt, patient and caregiver education, NMR, cognitive perceptual awareness, language, pain mgt, coping skills, community reintegration . Goals have been set at supervision for mobility and self-care with moderate assist cueing, moderate assist for cognition,communication.    Meredith Staggers, MD, FAAPMR      See Team Conference Notes for weekly updates to the plan of care

## 2014-07-01 NOTE — Progress Notes (Signed)
Speech Language Pathology Daily Session Note  Patient Details  Name: [name] MRN: [id number] Date of Birth: [birth year]  Today's Date: 07/01/2014 SLP Individual Time: 1100-1200 SLP Individual Time Calculation (min): 60 min  Short Term Goals: Week 1: SLP Short Term Goal 1 (Week 1): Patient will consume least restrictive diet with Max A multimodal cues for utilization of swallowing compensatory strategies to minimize overt s/s of aspiration. SLP Short Term Goal 2 (Week 1): Pt will increase overall speech intelligibility by increased vocal intensity and over-articulation at the phrase level with Max A multimodal cues.  SLP Short Term Goal 3 (Week 1): Pt will use call bell to request assistance with Max A multimodal cues.  SLP Short Term Goal 4 (Week 1): Pt will sustain attention to basic functional tasks for 3 minutes with Max A multimodal cues.  SLP Short Term Goal 5 (Week 1): Pt will demonstrate functional problem solving for basic and familiar tasks with Max A multimodal cues.  SLP Short Term Goal 6 (Week 1): Pt will utilize external visual aids to recall new, daily information with Max A multimodal cues.   Skilled Therapeutic Interventions: Skilled treatment session focused on cognitive-linguistic goals. Upon arrival, patient was sitting upright in her wheelchair and was hesitant to participate in therapy session.  Patient demonstrated limited engagement with this clinician in a conversation and demonstrated verbal agitation when asked questions about wants/needs.  Patient initially required total A to initiate tasks which faded to supervision by end of session. Patient was able to name functional items when given a verbal description with 100% accuracy but required Max A multimodal cues to provide a verbal description of a specific item at the phrase level, suspect due to participation. Patient also participated in a 4 and 6 step picture sequencing task and required Mod A multimodal cues for  problem solving. Patient transferred back to bed at end of session due to fatigue. Patient left supine in bed with alarm on and all needs within reach. Continue with current plan of care.    FIM:  Comprehension Comprehension Mode: Auditory Comprehension: 2-Understands basic 25 - 49% of the time/requires cueing 51 - 75% of the time Expression Expression Mode: Verbal Expression: 2-Expresses basic 25 - 49% of the time/requires cueing 50 - 75% of the time. Uses single words/gestures. Social Interaction Social Interaction: 2-Interacts appropriately 25 - 49% of time - Needs frequent redirection. Problem Solving Problem Solving: 1-Solves basic less than 25% of the time - needs direction nearly all the time or does not effectively solve problems and may need a restraint for safety Memory Memory: 2-Recognizes or recalls 25 - 49% of the time/requires cueing 51 - 75% of the time  Pain Pain Assessment Pain Assessment: No/denies pain Pain Score: 0-No pain  Therapy/Group: Individual Therapy  Yazhini Mcaulay, [place] 07/01/2014, 12:16 PM

## 2014-07-01 NOTE — Progress Notes (Signed)
Occupational Therapy Session Note  Patient Details  Name: [name] MRN: [id number] Date of Birth: 04-23-1951  Today's Date: 07/01/2014 OT Individual Time:  0700-0800   Short Term Goals: Week 1:  OT Short Term Goal 1 (Week 1): Sponge Bath: Supervision to include sit and stand with no more than 75% cueing  OT Short Term Goal 2 (Week 1): LB Dressing:  Supervision to include sit and stand with no more than 75% cueing  OT Short Term Goal 3 (Week 1): Toilet transfer: Supervision to include sit and stand with no more than 75% cueing  OT Short Term Goal 4 (Week 1): Toileting: Supervision to include sit and stand with no more than 75% cueing  OT Short Term Goal 5 (Week 1): Caregiver Education:  Min assist providing multi-modal cues during BADL tasks  Skilled Therapeutic Interventions/Progress Updates:    Pt resting in bed upon arrival.  Pt initially declined use of toilet but when ambulating to sink patient stated, upon further questioning, that she need to use toilet.  Pt was already incontinent of bladder but also voided on toilet.  Pt required mod verbal cues to initiate use of toilet paper to perform toilet hygiene.  Pt engaged in bathing and dressing tasks seated in w/c at sink with sit<>stand to bathe buttocks and pull up pants.  Pt required max verbal cues to task initiation and sequencing of bathing tasks.  Pt initiated dressing tasks when presented with clothing.  Pt exhibited LOB X 1 while standing requiring min A to correct.  Focus on activity tolerance, functional amb with RW, sit<>stand, standing balance, task initiation, sequencing, attention to task, and safety awareness.  Therapy Documentation Precautions:  Precautions Precautions: Fall Precaution Comments: due to instability in standing.  Restrictions Weight Bearing Restrictions: No Pain: Pain Assessment Pain Assessment: No/denies pain  See FIM for current functional status  Therapy/Group: Individual Therapy  Leroy Libman 07/01/2014, 7:45 AM

## 2014-07-02 ENCOUNTER — Inpatient Hospital Stay (HOSPITAL_COMMUNITY): Payer: Medicaid Other | Admitting: Speech Pathology

## 2014-07-02 LAB — GLUCOSE, CAPILLARY
GLUCOSE-CAPILLARY: 104 mg/dL — AB (ref 70–99)
GLUCOSE-CAPILLARY: 96 mg/dL (ref 70–99)
GLUCOSE-CAPILLARY: 110 mg/dL — AB (ref 70–99)
GLUCOSE-CAPILLARY: 117 mg/dL — AB (ref 70–99)
Glucose-Capillary: 154 mg/dL — ABNORMAL HIGH (ref 70–99)

## 2014-07-02 NOTE — Progress Notes (Signed)
Speech Language Pathology Daily Session Note  Patient Details  Name: [name] MRN: [id number] Date of Birth: 10-23-1950  Today's Date: 07/02/2014 SLP Individual Time: 1400-1430 SLP Individual Time Calculation (min): 30 min  Short Term Goals: Week 1: SLP Short Term Goal 1 (Week 1): Patient will consume least restrictive diet with Max A multimodal cues for utilization of swallowing compensatory strategies to minimize overt s/s of aspiration. SLP Short Term Goal 2 (Week 1): Pt will increase overall speech intelligibility by increased vocal intensity and over-articulation at the phrase level with Max A multimodal cues.  SLP Short Term Goal 3 (Week 1): Pt will use call bell to request assistance with Max A multimodal cues.  SLP Short Term Goal 4 (Week 1): Pt will sustain attention to basic functional tasks for 3 minutes with Max A multimodal cues.  SLP Short Term Goal 5 (Week 1): Pt will demonstrate functional problem solving for basic and familiar tasks with Max A multimodal cues.  SLP Short Term Goal 6 (Week 1): Pt will utilize external visual aids to recall new, daily information with Max A multimodal cues.   Skilled Therapeutic Interventions:  Pt was seen for skilled ST targeting cognitive goals.  Upon arrival, pt was laying in bed with eyes closed, awake and responding to questions but very lethargic.  Per report, pt had been up in wheelchair since 7 AM and had just been returned to bed.  Pt agreeable to bedside therapies with mod encouragement.  SLP facilitated the session with a basic card game targeting sustained attention and functional problem solving.  Pt required overall mod assist cues for thought organization, initiation, and improved awareness of perseveration to complete task for ~75% accuracy.  During the game, SLP attempted to engage the pt in loosely structured conversations related to pt's biographical information to target communication impairments.  Pt responded to questions  with yes/no with min encouragement and increased processing time; however, she required max assist verbal cues to expand upon responses and was noted with x1 semantic paraphasic error noted.  Continue per current plan of care.    FIM:  Comprehension Comprehension Mode: Auditory Comprehension: 3-Understands basic 50 - 74% of the time/requires cueing 25 - 50%  of the time Expression Expression Mode: Verbal Expression: 3-Expresses basic 50 - 74% of the time/requires cueing 25 - 50% of the time. Needs to repeat parts of sentences. Social Interaction Social Interaction: 3-Interacts appropriately 50 - 74% of the time - May be physically or verbally inappropriate. Problem Solving Problem Solving: 3-Solves basic 50 - 74% of the time/requires cueing 25 - 49% of the time Memory Memory: 2-Recognizes or recalls 25 - 49% of the time/requires cueing 51 - 75% of the time   Pain Pain Assessment Pain Assessment: No/denies pain  Therapy/Group: Individual Therapy  [name], M.A. CCC-SLP   [name], [name] 07/02/2014, 4:17 PM

## 2014-07-02 NOTE — Progress Notes (Signed)
[facility] PHYSICAL MEDICINE & REHABILITATION     PROGRESS NOTE    Subjective/Complaints: No new issues. Denies pain. Slept soundly.  Objective: Vital Signs: Blood pressure 111/56, pulse 77, temperature 98.7 F (37.1 C), temperature source Oral, resp. rate 18, height 5\' 1"  (1.549 m), weight 56.065 kg (123 lb 9.6 oz), SpO2 97.00%. No results found. No results found for this basename: WBC, HGB, HCT, PLT,  in the last 72 hours No results found for this basename: NA, K, CL, CO, GLUCOSE, BUN, CREATININE, CALCIUM,  in the last 72 hours CBG (last 3)   Recent Labs  07/01/14 2125 07/02/14 0711 07/02/14 0714  GLUCAP 101* 96 104*    Wt Readings from Last 3 Encounters:  07/01/14 56.065 kg (123 lb 9.6 oz)  06/23/14 59.1 kg (130 lb 4.7 oz)  06/23/14 59.1 kg (130 lb 4.7 oz)    Physical Exam:  General: no distress. Head shaven on left  Eyes: EOM are normal  HEENT: edentulous, oral mucosa pink/moist.  Neck: Normal range of motion. Neck supple. No thyromegaly present.  Cardiovascular: Normal rate and regular rhythm. Systolic murmur  Respiratory: Effort normal and breath sounds normal. No respiratory distress.  GI: Soft. Bowel sounds are normal. She exhibits no distension.  Neurological:  Patient is alert and makes good eye contact with examiner. Continued delay in processing, better with cueing. She was able to provide her name, age and date of birth. She has difficulty geographical information. remains echolalic. Follows simple commands. Visual acuity fair. Seems to scan to all visual fields. Strength 4/5 bilateral deltoid, bicep, tricep, HI. LE: 3/5 HF, 4- KE and 4- to 4/5 with ADF/APF. Sensation? Diminished RLE----inconsistent. DTR's 1+.  Skin:  Craniotomy site with staples intact   Assessment/Plan: 1. Functional deficits secondary to metastatic lung cancer to the brain which require 3+ hours per day of interdisciplinary therapy in a comprehensive inpatient rehab setting. Physiatrist  is providing close team supervision and 24 hour management of active medical problems listed below. Physiatrist and rehab team continue to assess barriers to discharge/monitor patient progress toward functional and medical goals. FIM: FIM - Bathing Bathing Steps Patient Completed: Chest;Right Arm;Left Arm;Abdomen;Right upper leg;Left upper leg;Front perineal area;Buttocks Bathing: 4: Min-Patient completes 8-9 44f 10 parts or 75+ percent  FIM - Upper Body Dressing/Undressing Upper body dressing/undressing steps patient completed: Thread/unthread right sleeve of front closure shirt/dress;Thread/unthread left sleeve of front closure shirt/dress;Pull shirt around back of front closure shirt/dress Upper body dressing/undressing: 4: Min-Patient completed 75 plus % of tasks FIM - Lower Body Dressing/Undressing Lower body dressing/undressing steps patient completed: Don/Doff right shoe;Don/Doff left shoe;Thread/unthread right pants leg;Thread/unthread left pants leg;Pull pants up/down Lower body dressing/undressing: 3: Mod-Patient completed 50-74% of tasks  FIM - Toileting Toileting steps completed by patient: Performs perineal hygiene;Adjust clothing prior to toileting;Adjust clothing after toileting Toileting: 4: Steadying assist  FIM - Air cabin crew Transfers Assistive Devices: Elevated toilet seat;Grab bars;Walker Toilet Transfers: 4-To toilet/BSC: Min A (steadying Pt. > 75%);4-From toilet/BSC: Min A (steadying Pt. > 75%)  FIM - Bed/Chair Transfer Bed/Chair Transfer Assistive Devices: Arm rests Bed/Chair Transfer: 4: Bed > Chair or W/C: Min A (steadying Pt. > 75%);4: Chair or W/C > Bed: Min A (steadying Pt. > 75%)  FIM - Locomotion: Wheelchair Distance: 100' Locomotion: Wheelchair: 1: Total Assistance/staff pushes wheelchair (Pt<25%) FIM - Locomotion: Ambulation Locomotion: Ambulation Assistive Devices: Other (comment) (R HHA) Ambulation/Gait Assistance: Not tested  (comment) Locomotion: Ambulation: 2: Travels 50 - 149 ft with minimal assistance (Pt.>75%)  Comprehension  Comprehension Mode: Auditory Comprehension: 2-Understands basic 25 - 49% of the time/requires cueing 51 - 75% of the time  Expression Expression Mode: Nonverbal Expression: 1-Expresses basis less than 25% of the time/requires cueing greater than 75% of the time.  Social Interaction Social Interaction Mode: Asleep Social Interaction: 2-Interacts appropriately 25 - 49% of time - Needs frequent redirection.  Problem Solving Problem Solving Mode: Asleep Problem Solving: 1-Solves basic less than 25% of the time - needs direction nearly all the time or does not effectively solve problems and may need a restraint for safety  Memory Memory Mode: Asleep Memory: 2-Recognizes or recalls 25 - 49% of the time/requires cueing 51 - 75% of the time  Medical Problem List and Plan:  1. 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  2. DVT Prophylaxis/Anticoagulation: SCDs. Monitor for any signs of DVT  3. Pain Management: Hydrocodone as needed. Monitor with increased mobility  4. Mood/bipolar disorder: Risperdal 4 mg each bedtime. Mood stable at present 5. Neuropsych: This patient is not capable of making decisions on her own behalf.  6. Skin/Wound Care: Routine skin checks monitor craniotomy incision  7. Fluids/Electrolytes/Nutrition:   Provide nutritional supplements as needed   -labwork and elecrolytes within normal range thus far 8. Seizure prophylaxis. Keppra 500 mg twice a day  9. Diabetes mellitus. Glucophage 1000 mg twice a day initially--reduced to 500mg  bid.   -sugars better on reduced glucophage  -decadron taper LOS (Days) 4 A FACE TO FACE EVALUATION WAS PERFORMED  Jaydenn Boccio T 07/02/2014 9:01 AM

## 2014-07-03 ENCOUNTER — Inpatient Hospital Stay (HOSPITAL_COMMUNITY): Payer: Medicaid Other | Admitting: Occupational Therapy

## 2014-07-03 ENCOUNTER — Inpatient Hospital Stay (HOSPITAL_COMMUNITY): Payer: Medicaid Other | Admitting: Physical Therapy

## 2014-07-03 LAB — GLUCOSE, CAPILLARY
Glucose-Capillary: 100 mg/dL — ABNORMAL HIGH (ref 70–99)
Glucose-Capillary: 88 mg/dL (ref 70–99)
Glucose-Capillary: 147 mg/dL — ABNORMAL HIGH (ref 70–99)
Glucose-Capillary: 125 mg/dL — ABNORMAL HIGH (ref 70–99)

## 2014-07-03 MED ORDER — DEXAMETHASONE 2 MG PO TABS
2.0000 mg | ORAL_TABLET | Freq: Every day | ORAL | Status: AC
  Administered 2014-07-03 – 2014-07-04 (×2): 2 mg via ORAL
  Filled 2014-07-03 (×2): qty 1

## 2014-07-03 NOTE — Progress Notes (Signed)
Social Work  Social Work Assessment and Plan  Patient Details  Name: [name] MRN: [id number] Date of Birth: [birth year]  Today's Date: 07/01/2014  Problem List:  Patient Active Problem List   Diagnosis Date Noted  . Cerebral hemorrhage 06/18/2014  . Brain tumor 06/18/2014  . SMALL CELL CARCINOMA OF THE LUNG 12/19/2006  . 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 TOBACCO ABUSE 12/19/2006  . HYPERTENSION, BENIGN 12/19/2006   Past Medical History:  Past Medical History  Diagnosis Date  . Bipolar disorder   . Cancer     small call lung cancer   Past Surgical History:  Past Surgical History  Procedure Laterality Date  . Craniotomy Left 06/23/2014    Procedure: Left craniotomy for tumor resection;  Surgeon: Newman Pies, MD;  Location: [facility];  Service: Neurosurgery;  Laterality: Left;  Left craniotomy for tumor resection   Social History:  reports that she has been smoking.  She does not have any smokeless tobacco history on file. She reports that she does not drink alcohol or use illicit drugs.  Family / Support Systems Marital Status: Divorced How Long?: family unsure - pt unable to remember Patient Roles: Other (Comment) (sister, aunt) Children: per pt and her brother, pt does have 3 adult children all "living up [place]" but no contact with them Other Supports: lives with brother, Philiip Optometrist Anticipated Caregiver: Elsie Ra, brother and Museum/gallery conservator (Phillip's adult granddaughter) Ability/Limitations of Caregiver: Doren Custard is on disability for his back Caregiver Availability: 24/7 Family Dynamics: brother and other family members appear supportive during their visits which are daily.  Very confusing who all is actually living in the home as family reports seem to change.    Social History Preferred language: English Religion: Non-Denominational Cultural Background: NA Education: Pt  reports she finished HS, however, brother unsure Read:  Yes Write: Yes Employment Status: Disabled Date Retired/Disabled/Unemployed: brother unsure how long Freight forwarder Issues: No current legal hx, however, per prior records in hospital chart, pt with hx of incarceration in 2004. Guardian/Conservator: None - per MD, pt not capable of making decisions on her own behalf.  Per family, pt estranged from her children, therefore, decision making falls to brother.   Abuse/Neglect Physical Abuse: Denies Verbal Abuse: Denies Sexual Abuse: Denies Exploitation of patient/patient's resources: Denies Self-Neglect: Denies  Emotional Status Pt's affect, behavior adn adjustment status: Pt with signigicant cogntive difficulties and really cannot complete interview.  She attempts to answer and appears to be able to provide very basic information.  Her attention is also very imparied.  Does not appear to be in any emotional distress by ourward appearance and per family report.  Will monitor.  Will refer to neuropsych for cognitive eval and coping support when appropriate. Recent Psychosocial Issues: None recent per family Pyschiatric History: Chart notes "bipolar d/o", however, brother unsure who pt has seen in the past for management of this or other health issues. "She don't like to go to doctors".    Patient / Family Perceptions, Expectations & Goals Pt/Family understanding of illness & functional limitations: Pt reports "I had an aneurysm" as reason for hospital admit.  Brother with basic understanding of her mets to brain and surgery performed.  He then makes a "joke" and asks me "You think they got all of it (brain) put back in right or is she screwed up?" Premorbid pt/family roles/activities: Pt was indpendent at home PTA, however, with increasing falls and poor balance for about a  month PTA. Anticipated changes in roles/activities/participation: Dependent on her physical gains, pt may require 24/7 assistance which family is not yet sure how  they will provide.  Brother remains as primary caregiver.  Pt/family expectations/goals: "I don't know what to expect" per brother.    Community Resources Express Scripts: None Premorbid Home Care/DME Agencies: None Transportation available at discharge: yes Resource referrals recommended: Neuropsychology;Support group (specify);Advocacy groups  Discharge Planning Living Arrangements: Other relatives Support Systems: Other relatives Type of Residence: Private residence Insurance Resources: Medicaid (specify county) Museum/gallery curator Resources: SSI Financial Screen Referred: No Living Expenses: Education officer, community Management: Family Does the patient have any problems obtaining your medications?: No Home Management: pt and family share in upkeep of home Patient/Family Preliminary Plans: Pt and brother report their plan is for pt to return home with brother Barriers to Discharge: Family Support (may not be able to coordinate 24/7 care) Social Work Anticipated Follow Up Needs: HH/OP;SNF Expected length of stay: 10-14  Clinical Impression Very impaired woman here following craniotomy for removal of tumor/ brain mets.  Pt unable to complete initial assessment interview as her attention and awareness are very impaired.  Limited information from pt's brother via phone, however, he does report that family trying to coordinate a 24/7 care plan.  Will need to follow closely for education, support and d/c planning needs.  Involve neuropsychology as well when appropriate.  Marybeth Dandy 07/01/2014, 4:06 PM

## 2014-07-03 NOTE — Progress Notes (Signed)
[place] PHYSICAL MEDICINE & REHABILITATION     PROGRESS NOTE    Subjective/Complaints: Uneventful night. Cooperating with therapy. Denies h/a  Objective: Vital Signs: Blood pressure 101/48, pulse 80, temperature 98.6 F (37 C), temperature source Oral, resp. rate 18, height 5\' 1"  (1.549 m), weight 56.065 kg (123 lb 9.6 oz), SpO2 94.00%. No results found. No results found for this basename: WBC, HGB, HCT, PLT,  in the last 72 hours No results found for this basename: NA, K, CL, CO, GLUCOSE, BUN, CREATININE, CALCIUM,  in the last 72 hours CBG (last 3)   Recent Labs  07/02/14 1616 07/02/14 2109 07/03/14 0733  GLUCAP 154* 117* 100*    Wt Readings from Last 3 Encounters:  07/01/14 56.065 kg (123 lb 9.6 oz)  06/23/14 59.1 kg (130 lb 4.7 oz)  06/23/14 59.1 kg (130 lb 4.7 oz)    Physical Exam:  General: no distress. Head shaven on left  Eyes: EOM are normal  HEENT: edentulous, oral mucosa pink/moist.  Neck: Normal range of motion. Neck supple. No thyromegaly present.  Cardiovascular: Normal rate and regular rhythm. Systolic murmur  Respiratory: Effort normal and breath sounds normal. No respiratory distress.  GI: Soft. Bowel sounds are normal. She exhibits no distension.  Neurological:  Patient is alert and makes good eye contact with examiner. Continued delay in processing, better with cueing. She was able to provide her name, age and date of birth. She has difficulty geographical information. remains echolalic. Follows simple commands. Visual acuity fair. Seems to scan to all visual fields. Strength 4/5 bilateral deltoid, bicep, tricep, HI. LE: 3/5 HF, 4- KE and 4- to 4/5 with ADF/APF. Sensation? Diminished RLE----inconsistent. DTR's 1+.  Skin:  Craniotomy site with staples intact   Assessment/Plan: 1. Functional deficits secondary to metastatic lung cancer to the brain which require 3+ hours per day of interdisciplinary therapy in a comprehensive inpatient rehab  setting. Physiatrist is providing close team supervision and 24 hour management of active medical problems listed below. Physiatrist and rehab team continue to assess barriers to discharge/monitor patient progress toward functional and medical goals. FIM: FIM - Bathing Bathing Steps Patient Completed: Chest;Right Arm;Left Arm;Abdomen;Right upper leg;Left upper leg;Front perineal area;Buttocks Bathing: 4: Min-Patient completes 8-9 77f 10 parts or 75+ percent  FIM - Upper Body Dressing/Undressing Upper body dressing/undressing steps patient completed: Thread/unthread right sleeve of front closure shirt/dress;Thread/unthread left sleeve of front closure shirt/dress;Pull shirt around back of front closure shirt/dress Upper body dressing/undressing: 4: Min-Patient completed 75 plus % of tasks FIM - Lower Body Dressing/Undressing Lower body dressing/undressing steps patient completed: Don/Doff right shoe;Don/Doff left shoe;Thread/unthread right pants leg;Thread/unthread left pants leg;Pull pants up/down Lower body dressing/undressing: 3: Mod-Patient completed 50-74% of tasks  FIM - Toileting Toileting steps completed by patient: Performs perineal hygiene;Adjust clothing prior to toileting;Adjust clothing after toileting Toileting: 4: Steadying assist  FIM - Air cabin crew Transfers Assistive Devices: Elevated toilet seat;Grab bars;Walker Toilet Transfers: 4-To toilet/BSC: Min A (steadying Pt. > 75%);4-From toilet/BSC: Min A (steadying Pt. > 75%)  FIM - Bed/Chair Transfer Bed/Chair Transfer Assistive Devices: Arm rests Bed/Chair Transfer: 4: Bed > Chair or W/C: Min A (steadying Pt. > 75%);4: Chair or W/C > Bed: Min A (steadying Pt. > 75%)  FIM - Locomotion: Wheelchair Distance: 100' Locomotion: Wheelchair: 1: Total Assistance/staff pushes wheelchair (Pt<25%) FIM - Locomotion: Ambulation Locomotion: Ambulation Assistive Devices: Other (comment) (R HHA) Ambulation/Gait Assistance: Not  tested (comment) Locomotion: Ambulation: 2: Travels 50 - 149 ft with minimal assistance (Pt.>75%)  Comprehension  Comprehension Mode: Auditory Comprehension: 3-Understands basic 50 - 74% of the time/requires cueing 25 - 50%  of the time  Expression Expression Mode: Verbal Expression: 3-Expresses basic 50 - 74% of the time/requires cueing 25 - 50% of the time. Needs to repeat parts of sentences.  Social Interaction Social Interaction Mode: Asleep Social Interaction: 3-Interacts appropriately 50 - 74% of the time - May be physically or verbally inappropriate.  Problem Solving Problem Solving Mode: Asleep Problem Solving: 2-Solves basic 25 - 49% of the time - needs direction more than half the time to initiate, plan or complete simple activities  Memory Memory Mode: Asleep Memory: 2-Recognizes or recalls 25 - 49% of the time/requires cueing 51 - 75% of the time  Medical Problem List and Plan:  1. 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  2. DVT Prophylaxis/Anticoagulation: SCDs. Monitor for any signs of DVT  3. Pain Management: Hydrocodone as needed. Monitor with increased mobility  4. Mood/bipolar disorder: Risperdal 4 mg each bedtime. Mood stable at present 5. Neuropsych: This patient is not capable of making decisions on her own behalf.  6. Skin/Wound Care: Routine skin checks monitor craniotomy incision  7. Fluids/Electrolytes/Nutrition:   Provide nutritional supplements as needed   -labwork and elecrolytes within normal range thus far 8. Seizure prophylaxis. Keppra 500 mg twice a day  9. Diabetes mellitus. Glucophage 1000 mg twice a day initially--reduced to 500mg  bid.   -sugars improved on reduced glucophage  -decadron taper to 2mg  daily thru 10/26 then off LOS (Days) 5 A FACE TO FACE EVALUATION WAS PERFORMED  Scot Shiraishi T 07/03/2014 8:24 AM

## 2014-07-03 NOTE — Progress Notes (Signed)
Physical Therapy Session Note  Patient Details  Name: [name] MRN: [id number] Date of Birth: [birth year]  Today's Date: 07/03/2014 PT Individual Time: 0800-0900 PT Individual Time Calculation (min): 60 min   Short Term Goals: Week 1:  PT Short Term Goal 1 (Week 1): Pt to perform bed mobility in a standard bed with supervision PT Short Term Goal 2 (Week 1): Pt to perform transfer with use of LRAD and supervision PT Short Term Goal 3 (Week 1): Pt to maintain standing balance without AD with min A PT Short Term Goal 4 (Week 1): Pt to ambulate 150' with RW and supervision, 50% of time PT Short Term Goal 5 (Week 1): Pt to negotiate up/down 6 steps with single rail and min guard A  Skilled Therapeutic Interventions/Progress Updates:  Pt was seen bedside in the am. Pt transferred supine to edge of bed with side rail and min A. Pt transferred edge of bed to w/c with min A. Pt propelled w/c about 100 feet with B UEs and S with verbal cues. Pt ambulated about 10 feet x 2 with rolling walker and min A. Pt performed toilet transfers with min A and verbal cues. Pt propelled w/c about 150 feet with B UEs and S with verbal cues. Pt ambulated 100 feet x 2 with rolling walker and min A, verbal cues, pt ambulates with decreased step length B and decreased cadence. Pt performed cone taps 3 sets x 5 reps each for strengthening. Pt propelled w/c back to room with B UEs and S. Pt left sitting up in w.c with quick release belt in place and call bell within reach.   Therapy Documentation Precautions:  Precautions Precautions: Fall Precaution Comments: due to instability in standing.  Restrictions Weight Bearing Restrictions: No General:   Pain: No c/o pain.    Locomotion : Ambulation Ambulation/Gait Assistance: 4: Min assist   See FIM for current functional status  Therapy/Group: Individual Therapy  Dub Amis 07/03/2014, 12:56 PM

## 2014-07-03 NOTE — Progress Notes (Signed)
Occupational Therapy Session Notes  Patient Details  Name: [name] MRN: [id number] Date of Birth: December 03, 1950  Today's Date: 07/03/2014 OT Individual Time: 1105-1205 and 135-145 OT Individual Time Calculation (min): 60 min and 10 min (declined to participate, fatigue)   Short Term Goals: Week 1:  OT Short Term Goal 1 (Week 1): Sponge Bath: Supervision to include sit and stand with no more than 75% cueing  OT Short Term Goal 2 (Week 1): LB Dressing:  Supervision to include sit and stand with no more than 75% cueing  OT Short Term Goal 3 (Week 1): Toilet transfer: Supervision to include sit and stand with no more than 75% cueing  OT Short Term Goal 4 (Week 1): Toileting: Supervision to include sit and stand with no more than 75% cueing  OT Short Term Goal 5 (Week 1): Caregiver Education:  Min assist providing multi-modal cues during BADL tasks  Skilled Therapeutic Interventions/Progress Updates:  1)  Patient resting in w/c upon arrival.  Engaged in self care retraining to include sponge bath, toilet transfer and toileting, dress and groom then therapeutic activity in therapy gym with patient propelling w/c ~halfway to the gym.  Patient declined need to use the bathroom prior to start of bath. However, while standing at the sink for UB bath, patient became a little fidgity yet did not state that she needed to use the bathroom until this OT asked her.  She stated that she did need to use the bathroom and ambulated with RW and supervision to toilet.  Patient's brief was saturated with urine, had a BM while seated on the toilet and was supervision/set-up for hygiene as this OT offered warm wash cloth after 4 attempts to get clean with toilet paper while standing.  Patient completed bath and dress in sit and stand then attempted to comb her hair yet needed assistance to comb the back and keep her from combing the 2 staples that are hidden under hair on the right side of her head.  Patient required  multimodal cues occasionally to include HOH to stop her from cleaning the sink with the washcloth she had just used to wash BM from her bottom.  Patient stood for ~6-8 min while engaged in a game of checkers and needed mod cues for rules of the game and turn taking.  2)  Patient sleeping in w/c upon arrival.  Attempted to engage patient in OT session and patient very adamant about insisting that she go to bed and declined to participate.  Assisted patient to bed with supervision using RW.  Therapy Documentation Precautions:  Precautions Precautions: Fall Precaution Comments: due to instability in standing.  Restrictions Weight Bearing Restrictions: No Pain: denies pain in both sessions ADL: See FIM for current functional status  Therapy/Group: Individual Therapy  Ligia Duguay 07/03/2014, 11:19 AM

## 2014-07-04 ENCOUNTER — Encounter (HOSPITAL_COMMUNITY): Payer: Medicaid Other

## 2014-07-04 ENCOUNTER — Inpatient Hospital Stay (HOSPITAL_COMMUNITY): Payer: Medicaid Other

## 2014-07-04 ENCOUNTER — Inpatient Hospital Stay (HOSPITAL_COMMUNITY): Payer: Medicaid Other | Admitting: Speech Pathology

## 2014-07-04 LAB — GLUCOSE, CAPILLARY
GLUCOSE-CAPILLARY: 119 mg/dL — AB (ref 70–99)
GLUCOSE-CAPILLARY: 94 mg/dL (ref 70–99)
Glucose-Capillary: 78 mg/dL (ref 70–99)

## 2014-07-04 NOTE — Progress Notes (Addendum)
Physical Therapy Note  Patient Details  Name: [name] MRN: [id number] Date of Birth: 1951-07-18 Today's Date: 07/04/2014  Pt missing 60 minute scheduled make up session due to pt refusal to participate secondary to fatigue.  Addendum: Returned to room approximately 10 minutes later to re-attempt session. Educated pt on importance of participating in therapy sessions. Attempted to engage pt in working puzzle for cognitive remediation. However, pt continuing to decline due to fatigue.   Stefano Gaul 07/04/2014, 3:11 PM

## 2014-07-04 NOTE — Plan of Care (Signed)
Problem: RH PAIN MANAGEMENT Goal: RH STG PAIN MANAGED AT OR BELOW PT'S PAIN GOAL <3  Outcome: Not Progressing Reports pain as 10

## 2014-07-04 NOTE — Consult Note (Signed)
NEUROBEHAVIORAL STATUS EXAM - [facility]   MEDICAL NECESSITY:  [name] was seen on the [facility] for a neurobehavioral status exam owing to the patient's diagnosis of brain, and to assist in treatment planning during admission.   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 Cranial CT scan demonstrated large frontal mass.Underwent left frontal craniotomy for gross total resection of frontal brain tumor, left suboccipital craniotomy for gross total resection of left occipital tumor 06/23/2014 per Dr. Arnoldo Morale. Keppra added for seizure prophylaxis. Follow-up MRI postoperatively 06/24/2014 showing no enhancing marginal tissues seen at the site of resection."  During today's visit, [name] was not very interested in participating in this appointment and it was difficult to get her to engage. I was able together that she feels her ability to pay attention and concentration is worse. No memory issues endorsed. She said that she is unsure whether she is suffering from any other cognitive deficits.    From an emotional standpoint, [name] admitted to being diagnosed with and treated for depression and anxiety. She said she takes medication (she could not report the name) but she purportedly does not find them to be effective. It also took a copious amount of time to get her to tell me who prescribed these medications and she finally said it was her PCP. She has never participated in counseling. Records indicate she has been diagnosed with Bipolar I Disorder. [name] described feeling "disrespected" but she would not elaborate on what she meant or who she felt was disrespecting her. When prompted she said that she would rather not say. No adjustment issues endorsed. Suicidal/homicidal ideation, plan or intent was denied. The patient denied ever experiencing any auditory/visual hallucinations.   [name] said that she "may be" making progress in therapy but she was unsure. She described the rehab staff as "good." No barriers to therapy identified. She reportedly has her brother (who lives with her) to help her through this endeavor and she said he is going to be the one to help her transition home.   PROCEDURES ADMINISTERED: [2 units [id number] on 07/04/14] Diagnostic clinical interview  Review of available records Mental Status Exam-2 (brief version)  MENTAL STATUS: [name] mental status exam score of 11/16 indicates the presence of possible cognitive impairment or dementia. She was able to immediately register 3 words into memory but could not freely recall any of them after a very short delay. Recognition cueing was not beneficial. She was fully oriented to time (except the season). She was fully oriented to place (except the county). She was totally oriented to person and situation.   Behavioral Evaluation: [name] was appropriately dressed for season and situation. Normal posture was noted. She was quiet and did not spontaneously generate a lot of conversation. Rapport was difficult to establish.  Her speech was somewhat slurred and she could not always express ideas effectively. She seemed significantly drowsy and often seemed like she was drifting off to sleep and needed to be aroused. Her affect was flat. Attention and motivation were suboptimal. Optimal test taking conditions were maintained.   SUMMARY & IMPRESSION: Overall,  [name] denied suffering from any major cognitive difficulties, though it appears that she is functioning at a low level;  possibly at the level of dementia. There also appears to be significant mood symptoms present but it was difficult to gather information on this topic, though records indicate she has been diagnosed with Bipolar I Disorder.   At this time the exact etiology behind her cognitive deficits is unclear but brain tumor and resection surgery are the primary suspects. In addition, mood and medication side effects are also likely contributing.  In light of these findings, the following recommendations are offered.    RECOMMENDATIONS  Recommendations for treatment team:    Continued follow-up regarding her psychopharmacological medications. Since she is currently taking a mood stabilizer, her care providers could supplement with an antidepressant (e.g., SSRI) since she finds her medications for mood to be ineffective. Risperdal could be discontinued (unless it is being used for another purpose).    I would have liked to complete a more thorough neuropsychological screen (as was the plan) but the patient was unwilling. We could attempt this again on a different date but she may not fully engage in that testing session. A more comprehensive neuropsychological evaluation would be ideal upon discharge, but again, she may not be willing to participate.   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  Clinical Neuropsychologist  Rehabilitation psychologist

## 2014-07-04 NOTE — Progress Notes (Signed)
[facility]     PROGRESS NOTE    Subjective/Complaints: No problems overnight denies pain.    Objective: Vital Signs: Blood pressure 91/58, pulse 74, temperature 98.6 F (37 C), temperature source Oral, resp. rate 18, height 5\' 1"  (1.549 m), weight 56.065 kg (123 lb 9.6 oz), SpO2 97.00%. No results found. No results found for this basename: WBC, HGB, HCT, PLT,  in the last 72 hours No results found for this basename: NA, K, CL, CO, GLUCOSE, BUN, CREATININE, CALCIUM,  in the last 72 hours CBG (last 3)   Recent Labs  07/03/14 1702 07/03/14 2052 07/04/14 0643  GLUCAP 147* 125* 78    Wt Readings from Last 3 Encounters:  07/01/14 56.065 kg (123 lb 9.6 oz)  06/23/14 59.1 kg (130 lb 4.7 oz)  06/23/14 59.1 kg (130 lb 4.7 oz)    Physical Exam:  General: no distress. Head shaven on left  Eyes: EOM are normal  HEENT: edentulous, oral mucosa pink/moist.  Neck: Normal range of motion. Neck supple. No thyromegaly present.  Cardiovascular: Normal rate and regular rhythm. Systolic murmur  Respiratory: Effort normal and breath sounds normal. No respiratory distress.  GI: Soft. Bowel sounds are normal. She exhibits no distension.  Neurological:  Patient is alert and makes good eye contact with examiner. Continued delay in processing, better with cueing. She was able to provide her name, age and date of birth. She has difficulty geographical information. remains echolalic. Follows simple commands. Visual acuity fair. Seems to scan to all visual fields. Strength 4/5 bilateral deltoid, bicep, tricep, HI. LE: 3/5 HF, 4- KE and 4- to 4/5 with ADF/APF. Sensation? Diminished RLE----inconsistent. DTR's 1+.  Skin:  Craniotomy site with staples intact   Assessment/Plan: 1. Functional deficits secondary to metastatic lung cancer to the brain which require 3+ hours per day of interdisciplinary therapy in a comprehensive inpatient rehab setting. Physiatrist is  providing close team supervision and 24 hour management of active medical problems listed below. Physiatrist and rehab team continue to assess barriers to discharge/monitor patient progress toward functional and medical goals. FIM: FIM - Bathing Bathing Steps Patient Completed: Chest;Right Arm;Left Arm;Abdomen;Right upper leg;Left upper leg;Front perineal area;Buttocks Bathing: 4: Min-Patient completes 8-9 22f 10 parts or 75+ percent  FIM - Upper Body Dressing/Undressing Upper body dressing/undressing steps patient completed: Thread/unthread right sleeve of pullover shirt/dresss;Thread/unthread left sleeve of pullover shirt/dress;Put head through opening of pull over shirt/dress;Pull shirt over trunk Upper body dressing/undressing: 5: Set-up assist to: Obtain clothing/put away FIM - Lower Body Dressing/Undressing Lower body dressing/undressing steps patient completed: Don/Doff right shoe;Don/Doff left shoe;Thread/unthread right pants leg;Thread/unthread left pants leg;Pull pants up/down Lower body dressing/undressing: 5: Set-up assist to: Obtain clothing  FIM - Toileting Toileting steps completed by patient: Performs perineal hygiene;Adjust clothing prior to toileting;Adjust clothing after toileting Toileting: 5: Set-up assist to: Obtain supplies  FIM - Air cabin crew Transfers Assistive Devices: Elevated toilet seat;Grab bars;Walker Toilet Transfers: 4-To toilet/BSC: Min A (steadying Pt. > 75%);4-From toilet/BSC: Min A (steadying Pt. > 75%)  FIM - Bed/Chair Transfer Bed/Chair Transfer Assistive Devices: Arm rests;Bed rails Bed/Chair Transfer: 4: Supine > Sit: Min A (steadying Pt. > 75%/lift 1 leg);4: Bed > Chair or W/C: Min A (steadying Pt. > 75%)  FIM - Locomotion: Wheelchair Distance: 100' Locomotion: Wheelchair: 5: Travels 150 ft or more: maneuvers on rugs and over door sills with supervision, cueing or coaxing FIM - Locomotion: Ambulation Locomotion: Ambulation Assistive  Devices: Administrator Ambulation/Gait Assistance: 4: Min  assist Locomotion: Ambulation: 2: Travels 50 - 149 ft with minimal assistance (Pt.>75%)  Comprehension Comprehension Mode: Auditory Comprehension: 3-Understands basic 50 - 74% of the time/requires cueing 25 - 50%  of the time  Expression Expression Mode: Verbal Expression: 3-Expresses basic 50 - 74% of the time/requires cueing 25 - 50% of the time. Needs to repeat parts of sentences.  Social Interaction Social Interaction Mode: Asleep Social Interaction: 3-Interacts appropriately 50 - 74% of the time - May be physically or verbally inappropriate.  Problem Solving Problem Solving Mode: Asleep Problem Solving: 3-Solves basic 50 - 74% of the time/requires cueing 25 - 49% of the time  Memory Memory Mode: Asleep Memory: 2-Recognizes or recalls 25 - 49% of the time/requires cueing 51 - 75% of the time  Medical Problem List and Plan:  1. 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  2. DVT Prophylaxis/Anticoagulation: SCDs. Monitor for any signs of DVT  3. Pain Management: Hydrocodone as needed. Monitor with increased mobility  4. Mood/bipolar disorder: Risperdal 4 mg each bedtime. Mood stable at present 5. Neuropsych: This patient is not capable of making decisions on her own behalf.  6. Skin/Wound Care: Routine skin checks monitor craniotomy incision  7. Fluids/Electrolytes/Nutrition:   Provide nutritional supplements as needed   -labwork and elecrolytes within normal range thus far 8. Seizure prophylaxis. Keppra 500 mg twice a day  9. Diabetes mellitus. Glucophage 1000 mg twice a day initially--reduced to 500mg  bid.   -sugars improved on reduced glucophage--,may be able to reduce further  -decadron taper to 2mg  daily thru 10/26 then off LOS (Days) 6 A FACE TO FACE EVALUATION WAS PERFORMED  SWARTZ,ZACHARY T 07/04/2014 8:39 AM

## 2014-07-04 NOTE — Progress Notes (Signed)
Social Work Patient ID: [name], female   DOB: [birth year], 63 y.o.   MRN: [id number]  Met with pt and brother again this morning to further discuss d/c planning/ care needs.  Stressing to both that pt must have 24/7 supervision (close/ min assist).  Brother reports that family cannot provide this amount of coverage.  Explained that Medicaid will not provide caregiver greater than 3hrs per day.  Discussed SNF option and brother feels like this is only option.  Pt simply asks, "You mean no doggie time?".  Clarified that she understood what we were talking about and that she would not be d/cing home out of CIR.  She nods "yes".   Will alert team to d/c plan.  Zaiya Annunziato, LCSW

## 2014-07-04 NOTE — Progress Notes (Signed)
Physical Therapy Session Note  Patient Details  Name: [name] MRN: [id number] Date of Birth: 12-Dec-1950  Today's Date: 07/04/2014 PT Individual Time: 0800-0900 PT Individual Time Calculation (min): 60 min   Short Term Goals: Week 1:  PT Short Term Goal 1 (Week 1): Pt to perform bed mobility in a standard bed with supervision PT Short Term Goal 2 (Week 1): Pt to perform transfer with use of LRAD and supervision PT Short Term Goal 3 (Week 1): Pt to maintain standing balance without AD with min A PT Short Term Goal 4 (Week 1): Pt to ambulate 150' with RW and supervision, 50% of time PT Short Term Goal 5 (Week 1): Pt to negotiate up/down 6 steps with single rail and min guard A  Skilled Therapeutic Interventions/Progress Updates:  1:1. Pt received sitting in w/c, ready for therapy. Focus this session on functional endurance during w/c propulsion and NuStep, functional ambulation, floor transfer and cognitive remediation. Pt oriented to person, place and time, but not situation. Pt repeatedly stating that she was wearing pants, despite sitting in brief only. Min A to don pants.   Pt req min A for all t/f sit<>stand and SPT w/c<>tx mat with L HHA. Pt req mod A for safe completion of floor transfer with mod cues for seq and initiation. Pt req supervision-min A for w/c propulsion 160'x1 with B UE and mod cues for selective attention to task in mildly busy gym environment.   Emphasis this session on ambulation with L HHA or carrying objects with B UE to challenge balance and prevent pt from reaching out to grasp various surfaces for support. Pt amb 80'x3 and 100'x1 with overall min A. Pt amb 160'x1 with RW back to room at end of session, req close(S)-min guard A.  Pt engaged in assembling basic pipe tree diagram to address sustained>selective attention, seq and initiation with max multimodal step by step cues for completion.   Pt with fair tolerance to NuStep to target B UE/LE strength and  endurance, level 3x90min and level 2x2' with max cues for sustained attention to task and tactile cues for pace.     Pt assisted back to bed with min A at end of session with all needs in reach, bed alarm on.   Therapy Documentation Precautions:  Precautions Precautions: Fall Precaution Comments: due to instability in standing.  Restrictions Weight Bearing Restrictions: No Pain: Pain Assessment Pain Assessment: Faces Faces Pain Scale: Hurts a little bit Pain Type: Acute pain Pain Location: Thoracic Pain Orientation: Right;Left Pain Descriptors / Indicators: Aching Pain Onset: On-going Pain Intervention(s): Distraction;RN made aware  See FIM for current functional status  Therapy/Group: Individual Therapy  [name] 07/04/2014, 9:00 AM

## 2014-07-04 NOTE — Progress Notes (Signed)
Occupational Therapy Session Note  Patient Details  Name: [name] MRN: [id number] Date of Birth: [birth year]  Today's Date: 07/04/2014 OT Individual Time: 1000-1100 OT Individual Time Calculation (min): 60 min    Short Term Goals: Week 1:  OT Short Term Goal 1 (Week 1): Sponge Bath: Supervision to include sit and stand with no more than 75% cueing  OT Short Term Goal 2 (Week 1): LB Dressing:  Supervision to include sit and stand with no more than 75% cueing  OT Short Term Goal 3 (Week 1): Toilet transfer: Supervision to include sit and stand with no more than 75% cueing  OT Short Term Goal 4 (Week 1): Toileting: Supervision to include sit and stand with no more than 75% cueing  OT Short Term Goal 5 (Week 1): Caregiver Education:  Min assist providing multi-modal cues during BADL tasks  Skilled Therapeutic Interventions/Progress Updates:     Pt resting in bed upon arrival.  Pt required max encouragement to participate in bathing and dressing tasks at sink.  Pt amb with RW with steady A to sink and completed bathing tasks with min verbal cues for sequencing.  Pt required steady A while standing and exhibited LOB X 1 requiring min A to correct.  Pt did not exhibit any righting reactions with LOB.  Focus on activity tolerance, transfers, functional amb with RW, standing balance, task initiation, sequencing, and safety awareness.    Therapy Documentation Precautions:  Precautions Precautions: Fall Precaution Comments: due to instability in standing.  Restrictions Weight Bearing Restrictions: No   Pain: Pain Assessment Pain Assessment: Faces Pain Score: 5  Faces Pain Scale: Hurts a little bit Pain Type: Acute pain Pain Location: Back Pain Orientation: Right;Left Pain Descriptors / Indicators: Aching Pain Onset: On-going Pain Intervention(s): Distraction;RN made aware  See FIM for current functional status  Therapy/Group: Individual Therapy  Leroy Libman 07/04/2014, 12:13 PM

## 2014-07-04 NOTE — Progress Notes (Signed)
Speech Language Pathology Daily Session Note  Patient Details  Name: [name] MRN: [id number] Date of Birth: 1950-10-23  Today's Date: 07/04/2014 SLP Individual Time: 7544-9201 SLP Individual Time Calculation (min): 60 min  Short Term Goals: Week 1: SLP Short Term Goal 1 (Week 1): Patient will consume least restrictive diet with Max A multimodal cues for utilization of swallowing compensatory strategies to minimize overt s/s of aspiration. SLP Short Term Goal 2 (Week 1): Pt will increase overall speech intelligibility by increased vocal intensity and over-articulation at the phrase level with Max A multimodal cues.  SLP Short Term Goal 3 (Week 1): Pt will use call bell to request assistance with Max A multimodal cues.  SLP Short Term Goal 4 (Week 1): Pt will sustain attention to basic functional tasks for 3 minutes with Max A multimodal cues.  SLP Short Term Goal 5 (Week 1): Pt will demonstrate functional problem solving for basic and familiar tasks with Max A multimodal cues.  SLP Short Term Goal 6 (Week 1): Pt will utilize external visual aids to recall new, daily information with Max A multimodal cues.   Skilled Therapeutic Interventions:  Pt was seen for skilled ST targeting cognitive goals.  Upon arrival, pt was upright in wheelchair, awake, alert, and agreeable to participate in ST with mod encouragement.  SLP facilitated the session with a basic money management task with pt requiring overall mod assist cues for sustained attention,working memory, and thought organization to sort coins into groups by value.  Pt also required max assist to count change due to perseveration, decreased sustained attention, and working memory deficits.  Pt returned demonstration of call bell at the end of the session with min assist verbal cues.  Continue per current plan of care.    FIM:  Comprehension Comprehension Mode: Auditory Comprehension: 3-Understands basic 50 - 74% of the time/requires cueing  25 - 50%  of the time Expression Expression Mode: Verbal Expression: 3-Expresses basic 50 - 74% of the time/requires cueing 25 - 50% of the time. Needs to repeat parts of sentences. Social Interaction Social Interaction: 3-Interacts appropriately 50 - 74% of the time - May be physically or verbally inappropriate. Problem Solving Problem Solving: 3-Solves basic 50 - 74% of the time/requires cueing 25 - 49% of the time Memory Memory: 2-Recognizes or recalls 25 - 49% of the time/requires cueing 51 - 75% of the time   Pain Pain Assessment Pain Assessment: No/denies pain  Therapy/Group: Individual Therapy  Windell Moulding, M.A. CCC-SLP  Amaree Loisel, Selinda Orion 07/04/2014, 4:12 PM

## 2014-07-05 ENCOUNTER — Inpatient Hospital Stay (HOSPITAL_COMMUNITY): Payer: Medicaid Other | Admitting: Speech Pathology

## 2014-07-05 ENCOUNTER — Encounter (HOSPITAL_COMMUNITY): Payer: Medicaid Other

## 2014-07-05 ENCOUNTER — Inpatient Hospital Stay (HOSPITAL_COMMUNITY): Payer: Medicaid Other

## 2014-07-05 DIAGNOSIS — E119 Type 2 diabetes mellitus without complications: Secondary | ICD-10-CM

## 2014-07-05 DIAGNOSIS — F319 Bipolar disorder, unspecified: Secondary | ICD-10-CM

## 2014-07-05 DIAGNOSIS — D496 Neoplasm of unspecified behavior of brain: Secondary | ICD-10-CM

## 2014-07-05 DIAGNOSIS — I10 Essential (primary) hypertension: Secondary | ICD-10-CM

## 2014-07-05 LAB — GLUCOSE, CAPILLARY
GLUCOSE-CAPILLARY: 75 mg/dL (ref 70–99)
Glucose-Capillary: 88 mg/dL (ref 70–99)
Glucose-Capillary: 138 mg/dL — ABNORMAL HIGH (ref 70–99)
Glucose-Capillary: 89 mg/dL (ref 70–99)

## 2014-07-05 MED ORDER — METFORMIN HCL 500 MG PO TABS
250.0000 mg | ORAL_TABLET | Freq: Two times a day (BID) | ORAL | Status: DC
  Administered 2014-07-05: 250 mg via ORAL
  Filled 2014-07-05 (×4): qty 1

## 2014-07-05 NOTE — Progress Notes (Signed)
[facility] PHYSICAL MEDICINE & REHABILITATION     PROGRESS NOTE    Subjective/Complaints: No problems overnight denies pain.    Objective: Vital Signs: Blood pressure 97/63, pulse 78, temperature 98.7 F (37.1 C), temperature source Oral, resp. rate 17, height 5\' 1"  (1.549 m), weight 56.065 kg (123 lb 9.6 oz), SpO2 98.00%. No results found. No results found for this basename: WBC, HGB, HCT, PLT,  in the last 72 hours No results found for this basename: NA, K, CL, CO, GLUCOSE, BUN, CREATININE, CALCIUM,  in the last 72 hours CBG (last 3)   Recent Labs  07/04/14 1142 07/04/14 2034 07/05/14 0638  GLUCAP 119* 94 88    Wt Readings from Last 3 Encounters:  07/01/14 56.065 kg (123 lb 9.6 oz)  06/23/14 59.1 kg (130 lb 4.7 oz)  06/23/14 59.1 kg (130 lb 4.7 oz)    Physical Exam:  General: no distress. Head shaven on left  Eyes: EOM are normal  HEENT: edentulous, oral mucosa pink/moist.  Neck: Normal range of motion. Neck supple. No thyromegaly present.  Cardiovascular: Normal rate and regular rhythm. Systolic murmur  Respiratory: Effort normal and breath sounds normal. No respiratory distress.  GI: Soft. Bowel sounds are normal. She exhibits no distension.  Neurological:  Patient is alert and makes good eye contact with examiner. Continued delay in processing, better with cueing. She was able to provide her name, age and date of birth.  Less echolalic. Visual acuity fair. Seems to scan to all visual fields. Strength 4/5 bilateral deltoid, bicep, tricep, HI. LE: 3+/5 HF, 4 KE and   4/5 with ADF/APF.  RLE----inconsistent. DTR's 1+.  Skin:  Craniotomy site with staples intact   Assessment/Plan: 1. Functional deficits secondary to metastatic lung cancer to the brain which require 3+ hours per day of interdisciplinary therapy in a comprehensive inpatient rehab setting. Physiatrist is providing close team supervision and 24 hour management of active medical problems listed  below. Physiatrist and rehab team continue to assess barriers to discharge/monitor patient progress toward functional and medical goals. FIM: FIM - Bathing Bathing Steps Patient Completed: Chest;Right Arm;Left Arm;Abdomen;Right upper leg;Left upper leg;Front perineal area;Buttocks;Right lower leg (including foot);Left lower leg (including foot) Bathing: 4: Steadying assist  FIM - Upper Body Dressing/Undressing Upper body dressing/undressing steps patient completed: Thread/unthread right sleeve of pullover shirt/dresss;Thread/unthread left sleeve of pullover shirt/dress;Put head through opening of pull over shirt/dress;Pull shirt over trunk Upper body dressing/undressing: 5: Set-up assist to: Obtain clothing/put away FIM - Lower Body Dressing/Undressing Lower body dressing/undressing steps patient completed: Don/Doff right shoe;Don/Doff left shoe;Thread/unthread right pants leg;Thread/unthread left pants leg;Pull pants up/down Lower body dressing/undressing: 5: Supervision: Safety issues/verbal cues  FIM - Toileting Toileting steps completed by patient: Adjust clothing prior to toileting;Performs perineal hygiene;Adjust clothing after toileting Toileting: 4: Steadying assist  FIM - Air cabin crew Transfers Assistive Devices: Elevated toilet seat;Grab bars;Walker Toilet Transfers: 4-To toilet/BSC: Min A (steadying Pt. > 75%);4-From toilet/BSC: Min A (steadying Pt. > 75%)  FIM - Bed/Chair Transfer Bed/Chair Transfer Assistive Devices: Arm rests;Bed rails;Walker Bed/Chair Transfer: 4: Bed > Chair or W/C: Min A (steadying Pt. > 75%);4: Sit > Supine: Min A (steadying pt. > 75%/lift 1 leg);4: Chair or W/C > Bed: Min A (steadying Pt. > 75%)  FIM - Locomotion: Wheelchair Distance: 100' Locomotion: Wheelchair: 4: Travels 150 ft or more: maneuvers on rugs and over door sillls with minimal assistance (Pt.>75%) FIM - Locomotion: Ambulation Locomotion: Ambulation Assistive Devices: Walker -  Rolling;Other (comment) ([facility]) Ambulation/Gait Assistance: 4:  Min assist Locomotion: Ambulation: 2: Travels 50 - 149 ft with minimal assistance (Pt.>75%)  Comprehension Comprehension Mode: Auditory Comprehension: 3-Understands basic 50 - 74% of the time/requires cueing 25 - 50%  of the time  Expression Expression Mode: Verbal Expression: 3-Expresses basic 50 - 74% of the time/requires cueing 25 - 50% of the time. Needs to repeat parts of sentences.  Social Interaction Social Interaction Mode: Asleep Social Interaction: 3-Interacts appropriately 50 - 74% of the time - May be physically or verbally inappropriate.  Problem Solving Problem Solving Mode: Asleep Problem Solving: 3-Solves basic 50 - 74% of the time/requires cueing 25 - 49% of the time  Memory Memory Mode: Asleep Memory: 2-Recognizes or recalls 25 - 49% of the time/requires cueing 51 - 75% of the time  Medical Problem List and Plan:  1. Functional deficits secondary to left frontal and occipital brain tumors (metastatic lung) status post resection 06/23/2014  -remove staples.  2. DVT Prophylaxis/Anticoagulation: SCDs. Monitor for any signs of DVT  3. Pain Management: Hydrocodone as needed. Monitor with increased mobility  4. Mood/bipolar disorder: Risperdal 4 mg each bedtime. Mood stable at present 5. Neuropsych: This patient is not capable of making decisions on her own behalf.  6. Skin/Wound Care: Routine skin checks monitor craniotomy incision  7. Fluids/Electrolytes/Nutrition:   Provide nutritional supplements as needed    -labwork and elecrolytes within normal range thus far 8. Seizure prophylaxis. Keppra 500 mg twice a day  9. Diabetes mellitus. Glucophage 1000 mg twice a day initially--reduced to 500mg  bid.   -sugars improved on reduced glucophage--,reduce to 250mg  bid starting today  -decadron off LOS (Days) 7 A FACE TO FACE EVALUATION WAS PERFORMED  Sondos Wolfman T 07/05/2014 8:58 AM

## 2014-07-05 NOTE — Progress Notes (Signed)
Occupational Therapy Session Note  Patient Details  Name: [name] MRN: [id number] Date of Birth: [birth year]  Today's Date: 07/05/2014 OT Individual Time: 7048-8891 OT Individual Time Calculation (min): 60 min    Short Term Goals: Week 1:  OT Short Term Goal 1 (Week 1): Sponge Bath: Supervision to include sit and stand with no more than 75% cueing  OT Short Term Goal 2 (Week 1): LB Dressing:  Supervision to include sit and stand with no more than 75% cueing  OT Short Term Goal 3 (Week 1): Toilet transfer: Supervision to include sit and stand with no more than 75% cueing  OT Short Term Goal 4 (Week 1): Toileting: Supervision to include sit and stand with no more than 75% cueing  OT Short Term Goal 5 (Week 1): Caregiver Education:  Min assist providing multi-modal cues during BADL tasks  Skilled Therapeutic Interventions/Progress Updates:    Pt resting in bed upon arrival and agreeable to participating in bathing and dressing with sit<>stand from w/c at sink.  Pt required min verbal cues and extra time to initiate sitting EOB in preparation for amb with RW to w/c at sink.  Pt completed bathing and dressing tasks with sit<>stand from w/c at sink, requiring min verbal cues to initiate.  Pt required extra time to complete tasks and min verbal cues for redirection to task.  Pt denied needing to use toilet but amb with RW to bathroom.  Pt did void while seated on toilet.  Focus on activity tolerance, sit<>stand, standing balance, task initiation, attention to task, functional amb with RW, and safety awareness.  Therapy Documentation Precautions:  Precautions Precautions: Fall Precaution Comments: due to instability in standing.  Restrictions Weight Bearing Restrictions: No General:   Vital Signs: Therapy Vitals Temp: 98.7 F (37.1 C) Temp Source: Oral Pulse Rate: 78 Resp: 17 BP: 97/63 mmHg Patient Position (if appropriate): Lying Oxygen Therapy SpO2: 98 % O2 Device: Not  Delivered Pain: Pain Assessment Pain Assessment: No/denies pain  See FIM for current functional status  Therapy/Group: Individual Therapy  Leroy Libman 07/05/2014, 8:48 AM

## 2014-07-05 NOTE — Progress Notes (Signed)
Speech Language Pathology Daily Session Note  Patient Details  Name: [name] MRN: [id number] Date of Birth: 1950/09/25  Today's Date: 07/05/2014 SLP Individual Time: 5361-4431 SLP Individual Time Calculation (min): 40 min and Today's Date: 07/05/2014 SLP Group Time: 5400-8676 SLP Group Time Calculation (min): 25 min  Short Term Goals: Week 1: SLP Short Term Goal 1 (Week 1): Patient will consume least restrictive diet with Max A multimodal cues for utilization of swallowing compensatory strategies to minimize overt s/s of aspiration. SLP Short Term Goal 2 (Week 1): Pt will increase overall speech intelligibility by increased vocal intensity and over-articulation at the phrase level with Max A multimodal cues.  SLP Short Term Goal 3 (Week 1): Pt will use call bell to request assistance with Max A multimodal cues.  SLP Short Term Goal 4 (Week 1): Pt will sustain attention to basic functional tasks for 3 minutes with Max A multimodal cues.  SLP Short Term Goal 5 (Week 1): Pt will demonstrate functional problem solving for basic and familiar tasks with Max A multimodal cues.  SLP Short Term Goal 6 (Week 1): Pt will utilize external visual aids to recall new, daily information with Max A multimodal cues.   Skilled Therapeutic Interventions: Skilled treatment session focused on cognitive-linguistic and dysphagia goals. Upon arrival, patient was sitting upright in the wheelchair with visitors present.  Patient was agreeable to participate and was cooperative throughout the entire session.  Patient named functional items on picture cards with Min A multimodal cues, however, patient required total A to self-monitor and correct errors.  Patient also required supervision verbal cues for problem solving/reasoning during a category exclusion task. Patient demonstrated increased initiation of verbal expression with another patient while consuming lunch meal of regular textures and thin liquids. Patient did  not demonstrate any overt s/s of aspiration but required Max A multimodal cues for use of small bites/sips throughout the meal. Patient was brought back to her room with quick release in place and all needs within reach. Continue with current plan of care.    FIM:  Comprehension Comprehension Mode: Auditory Comprehension: 3-Understands basic 50 - 74% of the time/requires cueing 25 - 50%  of the time Expression Expression Mode: Verbal Expression: 3-Expresses basic 50 - 74% of the time/requires cueing 25 - 50% of the time. Needs to repeat parts of sentences. Social Interaction Social Interaction: 3-Interacts appropriately 50 - 74% of the time - May be physically or verbally inappropriate. Problem Solving Problem Solving: 3-Solves basic 50 - 74% of the time/requires cueing 25 - 49% of the time Memory Memory: 2-Recognizes or recalls 25 - 49% of the time/requires cueing 51 - 75% of the time FIM - Eating Eating Activity: 5: Supervision/cues  Pain Pain Assessment Pain Assessment: No/denies pain  Therapy/Group: Individual Therapy  Taiga Lupinacci 07/05/2014, 3:54 PM

## 2014-07-05 NOTE — Plan of Care (Addendum)
Problem: RH SAFETY Goal: RH STG ADHERE TO SAFETY PRECAUTIONS W/ASSISTANCE/DEVICE STG Adhere to Safety Precautions With Min Assistance/Device.  Bed alarm on. Patient reminded to call for assistance  Problem: RH PAIN MANAGEMENT Goal: RH STG PAIN MANAGED AT OR BELOW PT'S PAIN GOAL. Patient still occasionally complains of headache. Goal reactivated

## 2014-07-05 NOTE — Plan of Care (Signed)
Problem: RH BLADDER ELIMINATION Goal: RH STG MANAGE BLADDER WITH ASSISTANCE STG Manage Bladder With min Assistance  Incontinent @ hs and wears brief

## 2014-07-05 NOTE — Plan of Care (Signed)
Problem: RH BOWEL ELIMINATION Goal: RH STG MANAGE BOWEL WITH ASSISTANCE STG Manage Bowel with Assistance. Mod I  Outcome: Progressing Pt wears brief and is incontinent @ hs. Continent during the day with timed toileting

## 2014-07-05 NOTE — Patient Care Conference (Signed)
Inpatient RehabilitationTeam Conference and Plan of Care Update Date: 07/05/2014   Time: 3:00 PM    Patient Name: [name]      Medical Record Number: [id number]  Date of Birth: 1951-04-16 Sex: Female         Room/Bed: 4W20C/4W20C-01 Payor Info: Payor: MEDICAID [place] / Plan: MEDICAID [place] ACCESS / Product Type: *No Product type* /    Admitting Diagnosis: brain tumor resection  lung   Admit Date/Time:  06/28/2014  6:58 PM Admission Comments: No comment available   Primary Diagnosis:  <principal problem not specified> Principal Problem: <principal problem not specified>  Patient Active Problem List   Diagnosis Date Noted  . Cerebral hemorrhage 06/18/2014  . Brain tumor 06/18/2014  . SMALL CELL CARCINOMA OF THE LUNG 12/19/2006  . 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 TOBACCO ABUSE 12/19/2006  . HYPERTENSION, BENIGN 12/19/2006    Expected Discharge Date: Expected Discharge Date:  (SNF)  Team Members Present: Physician leading conference: Dr. Alger Simons Social Worker Present: Lennart Pall, LCSW Nurse Present: Elliot Cousin, RN PT Present: Raylene Everts, PT;Bridgett Ripa, Cottie Banda, PT OT Present: Roanna Epley, Griffin Basil, OT SLP Present: Weston Anna, SLP Other (Discipline and Name): Danne Baxter, RN [facility]) PPS Coordinator present : Daiva Nakayama, RN, CRRN     Current Status/Progress Goal Weekly Team Focus  Medical   metastatic brian lesions s/p resections  improve consistent activity/better tolerance  pain control, neuro mgt   Bowel/Bladder   Continent of bowel and bladder. LBM 07/03/14  Pt to remain continent of bowel and bladder  Monitor   Swallow/Nutrition/ Hydration   Regular textures with thin liquids, full supervision and Max A for utilization of small bites/sips  Mod A for utilziation of swallowing compensatory strategies  Increased use of swallowing compensatory strategies   ADL's   steady A with standing/ambulation; max  verbal cues for safety awareness; mod verbal cues for task initiation/sequencing;  supervision overall  cognitive remediatin; activity tolerance, standing balance, funcitonal transfers/ambulation with RW   Mobility   Min A without use of RW; Supervision for bed mobility, standing balance, transfers and ambulation with use of RW  Supervision overall  Functional endurance, cognitive remediation, balance, safety during functional transfers and ambulation with RW   Communication   Mod-Max A  Mod A  increased initiation with verbal expression, naming and verbal expression at the phrase level    Safety/Cognition/ Behavioral Observations  Max A  Mod A  orientation, attention, initiation, problem solving    Pain   C/o back discomfort. Vicodin x 1 tab q 4hrs  <3  Offer pain med 1 hr prior to initial therapy session   Skin   Crani incision x 2 with staples approximated, OTA, healing appropriately  No additional skin breakdown  Assess skin q shift    Rehab Goals Patient on target to meet rehab goals: Yes *See Care Plan and progress notes for long and short-term goals.  Barriers to Discharge: poor insight and awareness    Possible Resolutions to Barriers:  supervision at home    Discharge Planning/Teaching Needs:  Plan now changed to SNF as family unable to provide 24/7 assist      Team Discussion:  Doing well medically and reaching supervision/ min assist overall.  SW reports change in d/c plan to SNF.    Revisions to Treatment Plan:  Change in dc plan   Continued Need for Acute Rehabilitation Level of Care: The patient requires daily medical management  by a physician with specialized training in physical medicine and rehabilitation for the following conditions: Daily direction of a multidisciplinary physical rehabilitation program to ensure safe treatment while eliciting the highest outcome that is of practical value to the patient.: Yes Daily medical management of patient stability for  increased activity during participation in an intensive rehabilitation regime.: Yes Daily analysis of laboratory values and/or radiology reports with any subsequent need for medication adjustment of medical intervention for : Post surgical problems;Neurological problems  Jaylynn Siefert 07/05/2014, 4:18 PM

## 2014-07-05 NOTE — Progress Notes (Signed)
Physical Therapy Session Note  Patient Details  Name: [name] MRN: [id number] Date of Birth: June 16, 1951  Today's Date: 07/05/2014 PT Individual Time: 5916-3846 PT Individual Time Calculation (min): 60 min   Short Term Goals: Week 1:  PT Short Term Goal 1 (Week 1): Pt to perform bed mobility in a standard bed with supervision PT Short Term Goal 2 (Week 1): Pt to perform transfer with use of LRAD and supervision PT Short Term Goal 3 (Week 1): Pt to maintain standing balance without AD with min A PT Short Term Goal 4 (Week 1): Pt to ambulate 150' with RW and supervision, 50% of time PT Short Term Goal 5 (Week 1): Pt to negotiate up/down 6 steps with single rail and min guard A  Skilled Therapeutic Interventions/Progress Updates:  1:1. Pt received sitting in w/c, ready for therapy. Focus this session on cognitive remediation, higher level balance and functional endurance during standing mobility. Pt req min A for all t/f sit<>stand with variable arm rest support.   Pt amb 165'x1 with RW and close(S)-min guard A room>therapy gym and 200'x1 with min R [facility] ortho gym>room. Pt's higher level balance challenged by amb 300'x1, 100'x1 as well as sidestepping 50'x1 while holding, tossing, bouncing or alternated tossing/bouncing of yellow therapy ball to target dual tasking and prevent pt from reaching out to surfaces for stabilization. Attempted to challenge pt with changes in speed, but pt unable to modify pace. Pt req min A overall with mod cues for sustained attention to task. Pt reporting mild dizziness during initial standing activity, but unable to further clarify.   Pt engaged in assembling same basic pipe tree diagram from yesterday, req mod cues overall for sustained>selective attention in mildly busy gym environment, problem solving and seq in decreased amount of time. Pt then assembling another basic pipe tree diagram with min A for problem solving only.   Attempted to engage pt in  conversation following tasks to promote intellectual/emergent awareness of deficits, but resulting in poor frustration tolerance.   Pt req steadying assist for toileting at end of session, mod cues to seq washing hands. Pt left sitting in w/c at end of session w/ all needs in reach and quick release belt in place.   Therapy Documentation Precautions:  Precautions Precautions: Fall Precaution Comments: due to instability in standing.  Restrictions Weight Bearing Restrictions: No   Pain: Pain Assessment Pain Assessment: No/denies pain  See FIM for current functional status  Therapy/Group: Individual Therapy  [name] 07/05/2014, 10:05 AM

## 2014-07-06 ENCOUNTER — Encounter (HOSPITAL_COMMUNITY): Payer: Medicaid Other

## 2014-07-06 ENCOUNTER — Inpatient Hospital Stay (HOSPITAL_COMMUNITY): Payer: Medicaid Other | Admitting: Speech Pathology

## 2014-07-06 ENCOUNTER — Inpatient Hospital Stay (HOSPITAL_COMMUNITY): Payer: Medicaid Other

## 2014-07-06 LAB — GLUCOSE, CAPILLARY
GLUCOSE-CAPILLARY: 68 mg/dL — AB (ref 70–99)
GLUCOSE-CAPILLARY: 86 mg/dL (ref 70–99)
Glucose-Capillary: 86 mg/dL (ref 70–99)
Glucose-Capillary: 83 mg/dL (ref 70–99)
Glucose-Capillary: 130 mg/dL — ABNORMAL HIGH (ref 70–99)

## 2014-07-06 NOTE — Progress Notes (Signed)
The skilled treatment note has been reviewed and SLP is in agreement. Tyneshia Stivers, M.A., CCC-SLP [phone]  

## 2014-07-06 NOTE — Progress Notes (Signed)
Occupational Therapy Weekly Progress Note  Patient Details  Name: [name] MRN: [id number] Date of Birth: Mar 12, 1951  Beginning of progress report period: June 29, 2014 End of progress report period: July 06, 2014  Patient has met 4 of 5 short term goals.  Pt has made steady progress with BADLs during this past week.  Pt continues to require mod encouragement to participate and mod/max verbal cues for task initiation, sequencing, attention to task, and safety awareness.  Pt requires close supervision for functional amb with RW and for standing balance.  Pt's discharge plan has changed to SNF.  Patient continues to demonstrate the following deficits: apraxia, cognitive deficits, weakness and therefore will continue to benefit from skilled OT intervention to enhance overall performance with BADL and Reduce care partner burden.  Patient progressing toward long term goals.(2 LTGs discharged due to change in discharge plan to SNF.  Continue plan of care.  OT Short Term Goals Week 1:  OT Short Term Goal 1 (Week 1): Sponge Bath: Supervision to include sit and stand with no more than 75% cueing  OT Short Term Goal 1 - Progress (Week 1): Met OT Short Term Goal 2 (Week 1): LB Dressing:  Supervision to include sit and stand with no more than 75% cueing  OT Short Term Goal 2 - Progress (Week 1): Met OT Short Term Goal 3 (Week 1): Toilet transfer: Supervision to include sit and stand with no more than 75% cueing  OT Short Term Goal 3 - Progress (Week 1): Met OT Short Term Goal 4 (Week 1): Toileting: Supervision to include sit and stand with no more than 75% cueing  OT Short Term Goal 4 - Progress (Week 1): Met OT Short Term Goal 5 (Week 1): Caregiver Education:  Min assist providing multi-modal cues during BADL tasks OT Short Term Goal 5 - Progress (Week 1): Discontinued (comment) Week 2:  OT Short Term Goal 1 (Week 2): Pt will complete sponge bath with supervision and min verbal cues OT Short  Term Goal 2 (Week 2): Pt will complete dressing tasks with supervision and min verbal cues OT Short Term Goal 3 (Week 2): Pt will complete toileting tasks with supervision and min verbal cues  Therapy Documentation Precautions:  Precautions Precautions: Fall Precaution Comments: due to instability in standing.  Restrictions Weight Bearing Restrictions: No  See FIM for current functional status  Leroy Libman 07/06/2014, 6:49 AM

## 2014-07-06 NOTE — Progress Notes (Signed)
Occupational Therapy Session Note  Patient Details  Name: [name] MRN: [id number] Date of Birth: 08/05/1951  Today's Date: 07/06/2014 OT Individual Time: 0700-0800 OT Individual Time Calculation (min): 60 min    Short Term Goals: Week 2:  OT Short Term Goal 1 (Week 2): Pt will complete sponge bath with supervision and min verbal cues OT Short Term Goal 2 (Week 2): Pt will complete dressing tasks with supervision and min verbal cues OT Short Term Goal 3 (Week 2): Pt will complete toileting tasks with supervision and min verbal cues  Skilled Therapeutic Interventions/Progress Updates:    Pt engaged in BADL retraining including bathing and dressing with sit<>stand from w/c at sink.  Pt amb with RW to use toilet before sitting in w/c at sink.  Pt required max verbal cues for task initiation, sequencing, and redirection to task this morning.  Pt required considerably more time to complete tasks and would often stop performing task and stare at the wall. Focus on activity tolerance, task initiation, sequencing, attention to task, standing balance, functional amb with RW, and safety awareness.  Therapy Documentation Precautions:  Precautions Precautions: Fall Precaution Comments: due to instability in standing.  Restrictions Weight Bearing Restrictions: No Pain: Pain Assessment Pain Assessment: No/denies pain  See FIM for current functional status  Therapy/Group: Individual Therapy  Leroy Libman 07/06/2014, 8:04 AM

## 2014-07-06 NOTE — Progress Notes (Signed)
Hypoglycemic Event  CBG: 68  Treatment: 15 GM carbohydrate snack  Symptoms: None  Follow-up CBG: Time:0706 CBG Result:86  Possible Reasons for Event: Inadequate meal intake  Comments/MD notified:Dan Aguilli, PA    [name]  Remember to initiate Hypoglycemia Order Set & complete

## 2014-07-06 NOTE — Progress Notes (Signed)
Physical Therapy Weekly Progress Note  Patient Details  Name: [name] MRN: [id number] Date of Birth: 10-13-1950  Beginning of progress report period: June 29, 2014 End of progress report period: July 06, 2014  Today's Date: 07/06/2014 PT Individual Time: 1761-6073 PT Individual Time Calculation (min): 60 min   Pt has made steady progress since admission and has achieved 5/5 STGs, see details below. Pt currently requires supervision for bed mobility and transfers, supervision-min A for ambulation with use of RW and HHA respectively, close(S)-min guard A for standing balance and min guard A for stair negotiation. Pt can mod-max encouragement to participate in therapy at times due to poor frustration tolerance with verbal agitation. Continued emphasis on cognitive remediation during tx sessions.  Patient continues to demonstrate the following deficits: sustained>selective attention, frustration tolerance, intellectual/emergent awareness, functional endurance, initiation, sequencing, balance, strength, safety during ambulation and transfers, and therefore will continue to benefit from skilled PT intervention to enhance overall performance with activity tolerance, balance, attention, awareness, transfers and mobility.   Patient progressing toward long term goals.  Plan of care revisions: LTGs modified due to anticipated d/c to SNF. Marland Kitchen  PT Short Term Goals Week 1:  PT Short Term Goal 1 (Week 1): Pt to perform bed mobility in a standard bed with supervision PT Short Term Goal 1 - Progress (Week 1): Met PT Short Term Goal 2 (Week 1): Pt to perform transfer with use of LRAD and supervision PT Short Term Goal 2 - Progress (Week 1): Met PT Short Term Goal 3 (Week 1): Pt to maintain standing balance without AD with min A PT Short Term Goal 3 - Progress (Week 1): Met PT Short Term Goal 4 (Week 1): Pt to ambulate 150' with RW and supervision, 50% of time PT Short Term Goal 4 - Progress (Week  1): Met PT Short Term Goal 5 (Week 1): Pt to negotiate up/down 6 steps with single rail and min guard A PT Short Term Goal 5 - Progress (Week 1): Met Week 2:  PT Short Term Goal 1 (Week 2): STGs=LTGs due to anticipated LOS  Skilled Therapeutic Interventions/Progress Updates:  1:1. Pt received sitting in w/c. Therapist attempting to engage pt in PT session for 80min, however, met with poor frustration tolerance and verbal agitation. Pt agreeable to therapist reattempting in 37min. Upon return, pt agreeable with minimal encouragement despite continued verbal agitation. Pt actively engaged in ambulation 300'x1 with supervision-min guard HHA in search for therapy dog. Pt with excellent selective attention to therapy dog at sitting level for 57min with significant improvement in mood following interaction.   Pt with good selective attention to game of Uno in therapy gym, but req mod-max verbal cues for problem solving. Pt only receptive to ambulation remainder of session. Pt amb 250'x1 and 175'x1 with supervision-min guard HHA on hard level and carpeted areas. When holding pt's hand, therapist attempting to facilitate increased weight shift to R for improved L step length. Pt req mod cues for locating room at end of session. Pt left sitting in w/c with all needs in reach and quick release belt in place.   Therapy Documentation Precautions:  Precautions Precautions: Fall Precaution Comments: due to instability in standing.  Restrictions Weight Bearing Restrictions: No  See FIM for current functional status  Therapy/Group: Individual Therapy  [name] 07/06/2014, 5:26 PM

## 2014-07-06 NOTE — Progress Notes (Signed)
Speech Language Pathology Daily Session Note  Patient Details  Name: CAILEIGH CANCHE MRN: [id number] Date of Birth: [birth year]  Today's Date: 07/06/2014 SLP Individual Time: 0830-0930 SLP Individual Time Calculation (min): 60 min  Short Term Goals: Week 1: SLP Short Term Goal 1 (Week 1): Patient will consume least restrictive diet with Max A multimodal cues for utilization of swallowing compensatory strategies to minimize overt s/s of aspiration. SLP Short Term Goal 2 (Week 1): Pt will increase overall speech intelligibility by increased vocal intensity and over-articulation at the phrase level with Max A multimodal cues.  SLP Short Term Goal 3 (Week 1): Pt will use call bell to request assistance with Max A multimodal cues.  SLP Short Term Goal 4 (Week 1): Pt will sustain attention to basic functional tasks for 3 minutes with Max A multimodal cues.  SLP Short Term Goal 5 (Week 1): Pt will demonstrate functional problem solving for basic and familiar tasks with Max A multimodal cues.  SLP Short Term Goal 6 (Week 1): Pt will utilize external visual aids to recall new, daily information with Max A multimodal cues.   Skilled Therapeutic Interventions: Skilled treatment session focused on cognitive-linguistic goals. Upon arrival, patient was sitting upright in the wheelchair and was agreeable to participate in therapy. Patient demonstrated limited engagement with this student in conversation and required Max A multimodal cues for utilization of verbal expression throughout the session. Student also facilitated session by providing Max A for sustained attention and Total A for problem solving during a 4-step picture sequencing task. Due to difficulty with sequencing task and decreased frustration tolerance, student altered task to making pudding, in which patient required Max-Total A multimodal cues for recall of steps and Mod A multimodal cues for problem solving to complete task. Session ended with  student providing Mod A multimodal cues for verbal expression and recall for route finding back to patient room. Patient left in wheelchair with quick release belt in place and all needs within reach. Continue with current plan of care.   FIM:  Comprehension Comprehension Mode: Auditory Comprehension: 3-Understands basic 50 - 74% of the time/requires cueing 25 - 50%  of the time Expression Expression Mode: Verbal Expression: 3-Expresses basic 50 - 74% of the time/requires cueing 25 - 50% of the time. Needs to repeat parts of sentences. Social Interaction Social Interaction: 2-Interacts appropriately 25 - 49% of time - Needs frequent redirection. Problem Solving Problem Solving: 2-Solves basic 25 - 49% of the time - needs direction more than half the time to initiate, plan or complete simple activities Memory Memory: 3-Recognizes or recalls 50 - 74% of the time/requires cueing 25 - 49% of the time  Pain Pain Assessment Pain Assessment: No/denies pain  Therapy/Group: Individual Therapy  Kaycee Mcgaugh 07/06/2014, 12:20 PM

## 2014-07-06 NOTE — Progress Notes (Signed)
[name] PHYSICAL MEDICINE & REHABILITATION     PROGRESS NOTE    Subjective/Complaints: No new issues. Slept well. Cooperates with therapy. Behavior can be variable though    Objective: Vital Signs: Blood pressure 110/64, pulse 75, temperature 97.8 F (36.6 C), temperature source Oral, resp. rate 17, height 5\' 1"  (1.549 m), weight 56.065 kg (123 lb 9.6 oz), SpO2 99.00%. No results found. No results found for this basename: WBC, HGB, HCT, PLT,  in the last 72 hours No results found for this basename: NA, K, CL, CO, GLUCOSE, BUN, CREATININE, CALCIUM,  in the last 72 hours CBG (last 3)   Recent Labs  07/05/14 2055 07/06/14 0627 07/06/14 0706  GLUCAP 89 68* 86    Wt Readings from Last 3 Encounters:  07/01/14 56.065 kg (123 lb 9.6 oz)  06/23/14 59.1 kg (130 lb 4.7 oz)  06/23/14 59.1 kg (130 lb 4.7 oz)    Physical Exam:  General: no distress. Head shaven on left  Eyes: EOM are normal  HEENT: edentulous, oral mucosa pink/moist.  Neck: Normal range of motion. Neck supple. No thyromegaly present.  Cardiovascular: Normal rate and regular rhythm. Systolic murmur  Respiratory: Effort normal and breath sounds normal. No respiratory distress.  GI: Soft. Bowel sounds are normal. She exhibits no distension.  Neurological:  Patient is alert and makes good eye contact with examiner. Continued delay in processing, better with cueing. She was able to provide her name, age and date of birth.  Less echolalic. Visual acuity fair. Seems to scan to all visual fields. Strength 4/5 bilateral deltoid, bicep, tricep, HI. LE: 3+/5 HF, 4 KE and   4/5 with ADF/APF.  RLE----inconsistent. DTR's 1+.  Skin:  Craniotomy site with intact with staples removed   Assessment/Plan: 1. Functional deficits secondary to metastatic lung cancer to the brain which require 3+ hours per day of interdisciplinary therapy in a comprehensive inpatient rehab setting. Physiatrist is providing close team supervision and 24  hour management of active medical problems listed below. Physiatrist and rehab team continue to assess barriers to discharge/monitor patient progress toward functional and medical goals. FIM: FIM - Bathing Bathing Steps Patient Completed: Chest;Right Arm;Left Arm;Abdomen;Right upper leg;Left upper leg;Front perineal area;Buttocks;Right lower leg (including foot);Left lower leg (including foot) Bathing: 5: Supervision: Safety issues/verbal cues  FIM - Upper Body Dressing/Undressing Upper body dressing/undressing steps patient completed: Thread/unthread right sleeve of pullover shirt/dresss;Thread/unthread left sleeve of pullover shirt/dress;Put head through opening of pull over shirt/dress;Pull shirt over trunk Upper body dressing/undressing: 5: Set-up assist to: Obtain clothing/put away FIM - Lower Body Dressing/Undressing Lower body dressing/undressing steps patient completed: Don/Doff right shoe;Don/Doff left shoe;Thread/unthread right pants leg;Thread/unthread left pants leg;Pull pants up/down Lower body dressing/undressing: 5: Supervision: Safety issues/verbal cues  FIM - Toileting Toileting steps completed by patient: Adjust clothing prior to toileting;Performs perineal hygiene;Adjust clothing after toileting Toileting Assistive Devices: Grab bar or rail for support Toileting: 4: Steadying assist  FIM - Radio producer Devices: Elevated toilet seat;Grab bars Toilet Transfers: 4-To toilet/BSC: Min A (steadying Pt. > 75%);4-From toilet/BSC: Min A (steadying Pt. > 75%)  FIM - Bed/Chair Transfer Bed/Chair Transfer Assistive Devices: Arm rests;Walker Bed/Chair Transfer: 4: Bed > Chair or W/C: Min A (steadying Pt. > 75%);4: Chair or W/C > Bed: Min A (steadying Pt. > 75%)  FIM - Locomotion: Wheelchair Distance: 100' Locomotion: Wheelchair: 0: Activity did not occur FIM - Locomotion: Ambulation Locomotion: Ambulation Assistive Devices: Walker - Rolling;Other  (comment) (R HHA or none) Ambulation/Gait Assistance: 4:  Min assist;5: Supervision Locomotion: Ambulation: 4: Travels 150 ft or more with minimal assistance (Pt.>75%)  Comprehension Comprehension Mode: Auditory Comprehension: 3-Understands basic 50 - 74% of the time/requires cueing 25 - 50%  of the time  Expression Expression Mode: Verbal Expression: 3-Expresses basic 50 - 74% of the time/requires cueing 25 - 50% of the time. Needs to repeat parts of sentences.  Social Interaction Social Interaction Mode: Asleep Social Interaction: 3-Interacts appropriately 50 - 74% of the time - May be physically or verbally inappropriate.  Problem Solving Problem Solving Mode: Asleep Problem Solving: 3-Solves basic 50 - 74% of the time/requires cueing 25 - 49% of the time  Memory Memory Mode: Asleep Memory: 2-Recognizes or recalls 25 - 49% of the time/requires cueing 51 - 75% of the time  Medical Problem List and Plan:  1. Functional deficits secondary to left frontal and occipital brain tumors (metastatic lung) status post resection 06/23/2014  -remove staples.  2. DVT Prophylaxis/Anticoagulation: SCDs. Monitor for any signs of DVT  3. Pain Management: Hydrocodone as needed. Monitor with increased mobility  4. Mood/bipolar disorder: Risperdal 4 mg each bedtime. Mood stable at present 5. Neuropsych: This patient is not capable of making decisions on her own behalf.  6. Skin/Wound Care: Routine skin checks monitor craniotomy incision  7. Fluids/Electrolytes/Nutrition:   Provide nutritional supplements as needed    -labwork and elecrolytes within normal range thus far 8. Seizure prophylaxis. Keppra 500 mg twice a day  9. Diabetes mellitus. Glucophage 1000 mg twice a day at home  -sugars remain low despite decreased glucophage---hold all together now  -decadron off LOS (Days) 8 A FACE TO FACE EVALUATION WAS PERFORMED  [name] T 07/06/2014 7:38 AM

## 2014-07-07 ENCOUNTER — Inpatient Hospital Stay (HOSPITAL_COMMUNITY): Payer: Medicaid Other | Admitting: Speech Pathology

## 2014-07-07 ENCOUNTER — Inpatient Hospital Stay (HOSPITAL_COMMUNITY): Payer: Medicaid Other | Admitting: *Deleted

## 2014-07-07 ENCOUNTER — Encounter (HOSPITAL_COMMUNITY): Payer: Medicaid Other

## 2014-07-07 LAB — GLUCOSE, CAPILLARY
GLUCOSE-CAPILLARY: 120 mg/dL — AB (ref 70–99)
Glucose-Capillary: 88 mg/dL (ref 70–99)
Glucose-Capillary: 92 mg/dL (ref 70–99)
Glucose-Capillary: 181 mg/dL — ABNORMAL HIGH (ref 70–99)

## 2014-07-07 NOTE — Progress Notes (Addendum)
Speech Language Pathology Weekly Progress and Session Note  Patient Details  Name: [name] MRN: [id number] Date of Birth: 10/23/1950  Beginning of progress report period: June 29, 2014 End of progress report period: July 07, 2014  Today's Date: 07/07/2014 SLP IGroup Time: 0900-1000 SLP Group Time Calculation (min): 60 min  Short Term Goals: Week 1: SLP Short Term Goal 1 (Week 1): Patient will consume least restrictive diet with Max A multimodal cues for utilization of swallowing compensatory strategies to minimize overt s/s of aspiration. SLP Short Term Goal 1 - Progress (Week 1): Met SLP Short Term Goal 2 (Week 1): Pt will increase overall speech intelligibility by increased vocal intensity and over-articulation at the phrase level with Max A multimodal cues.  SLP Short Term Goal 2 - Progress (Week 1): Met SLP Short Term Goal 3 (Week 1): Pt will use call bell to request assistance with Max A multimodal cues.  SLP Short Term Goal 3 - Progress (Week 1): Not met SLP Short Term Goal 4 (Week 1): Pt will sustain attention to basic functional tasks for 3 minutes with Max A multimodal cues.  SLP Short Term Goal 4 - Progress (Week 1): Met SLP Short Term Goal 5 (Week 1): Pt will demonstrate functional problem solving for basic and familiar tasks with Max A multimodal cues.  SLP Short Term Goal 5 - Progress (Week 1): Met SLP Short Term Goal 6 (Week 1): Pt will utilize external visual aids to recall new, daily information with Max A multimodal cues.  SLP Short Term Goal 6 - Progress (Week 1): Met    New Short Term Goals: Week 2: SLP Short Term Goal 1 (Week 2): Patient will consume least restrictive diet with Mod A multimodal cues for utilization of swallowing compensatory strategies to minimize overt s/s of aspiration. SLP Short Term Goal 2 (Week 2): Pt will increase overall speech intelligibility by increased vocal intensity and over-articulation at the phrase level with Mod A  multimodal cues.  SLP Short Term Goal 3 (Week 2): Pt will sustain attention to basic functional tasks for 5 minutes with Mod A multimodal cues.  SLP Short Term Goal 4 (Week 2): Pt will demonstrate functional problem solving for basic and familiar tasks with Mod A multimodal cues.  SLP Short Term Goal 5 (Week 2): Pt will utilize external visual aids to recall new, daily information with Mod A multimodal cues.  SLP Short Term Goal 6 (Week 2): Pt will use call bell to request assistance with Max A multimodal cues.   Weekly Progress Updates: Patient has made functional gains and has met 5 of 6 STG's this reporting period due to increased attention, problem solving, orientation, speech intelligibility and swallowing function.  Currently, patient is consuming regular textures with thin liquids without overt s/s of aspiration and requires Max A multimodal cues for utilization of small bites/sips.  Patient also requires overall Max A multimodal cues for sustained attention to functional tasks, orientation, initiation, problem solving and attention. Patient can verbally express her wants/needs with 100% intelligibility at the word and phrase level but continues to demonstrate intermittent verbal errors with structured tasks. Patient/family education ongoing and patient's discharge plan has now changed to SNF. Patient would benefit from continued skilled SLP intervention to maximize functional communication, swallowing function and her cognitive-linguistic function in order to maximize her overall functional independence.    Intensity: Minumum of 1-2 x/day, 30 to 90 minutes Frequency: 5 out of 7 days Duration/Length of Stay: TBD due to  SNF placement  Treatment/Interventions: Cognitive remediation/compensation;Cueing hierarchy;Internal/external aids;Environmental controls;Therapeutic Activities;Functional tasks;Patient/family education   Daily Session Skilled Therapeutic Interventions: Skilled treatment  session focused on cognitive-linguistic goals. SLP facilitated session by providing Mod A question cues for initiation of functional tasks and verbal expression.  Patient participated in a basic card game with another patient and required Mod A multimodal cues for functional problem solving with task and for attention to task in a mildly distracting environment.  Patient left in wheelchair with quick release belt in place and all needs within reach. Continue with current plan of care.    FIM:  Comprehension Comprehension Mode: Auditory Comprehension: 3-Understands basic 50 - 74% of the time/requires cueing 25 - 50%  of the time Expression Expression Mode: Verbal Expression: 3-Expresses basic 50 - 74% of the time/requires cueing 25 - 50% of the time. Needs to repeat parts of sentences. Social Interaction Social Interaction: 2-Interacts appropriately 25 - 49% of time - Needs frequent redirection. Problem Solving Problem Solving: 3-Solves basic 50 - 74% of the time/requires cueing 25 - 49% of the time Memory Memory: 3-Recognizes or recalls 50 - 74% of the time/requires cueing 25 - 49% of the time Pain Pain Assessment Pain Assessment: No/denies pain Pain Score: 0-No pain  Therapy/Group: Group Therapy  Dejanae Helser 07/07/2014, 11:12 AM

## 2014-07-07 NOTE — Plan of Care (Signed)
Problem: RH BOWEL ELIMINATION Goal: RH STG MANAGE BOWEL WITH ASSISTANCE STG Manage Bowel with Assistance. Mod I  Outcome: Not Progressing LBM 07-03-14 Goal: RH STG MANAGE BOWEL W/MEDICATION W/ASSISTANCE STG Manage Bowel with Medication with Assistance.  LBM 07-03-14

## 2014-07-07 NOTE — Progress Notes (Signed)
Occupational Therapy Session Note  Patient Details  Name: [name] MRN: [id number] Date of Birth: 05-07-1951  Today's Date: 07/07/2014 OT Individual Time: 0700-0800 OT Individual Time Calculation (min): 60 min    Short Term Goals: Week 2:  OT Short Term Goal 1 (Week 2): Pt will complete sponge bath with supervision and min verbal cues OT Short Term Goal 2 (Week 2): Pt will complete dressing tasks with supervision and min verbal cues OT Short Term Goal 3 (Week 2): Pt will complete toileting tasks with supervision and min verbal cues  Skilled Therapeutic Interventions/Progress Updates:    Pt engaged in BADL retraining including toilet transfers, toileting, and bathing and dressing with sit<>stand from w/c.  Pt denied needing to use toilet but voided once seated.  Pt also incontinent of bladder at beginning of session. Pt required max verbal cues and extra time to initiate all bathing and dressing tasks this morning.  Pt was non verbal during bathing and dressing but became more expressive after breakfast presented.  Pt required max verbal cues for attention to task throughout session.  Focus on functional amb with RW, sit<>stand, standing balance, task initiation, sequencing, attention to task, standing balance, and safety awareness.  Therapy Documentation Precautions:  Precautions Precautions: Fall Precaution Comments: due to instability in standing.  Restrictions Weight Bearing Restrictions: No Pain: Pain Assessment Pain Assessment: No/denies pain Pain Score: 0-No pain  See FIM for current functional status  Therapy/Group: Individual Therapy  Leroy Libman 07/07/2014, 2:33 PM

## 2014-07-07 NOTE — Progress Notes (Signed)
Social Work Patient ID: [name], female   DOB: [birth year], 64 y.o.   MRN: [id number]  Have reviewed team conference with pt and brother.  Pt with limited understanding/ appreciation of info provided.  Brother without any questions.  Aware I continue to pursue SNF for pt and will keep them posted.  Await some input from oncology about any plans that may be considered for further rad txs so that I can alert facility of the expectation.  Continue to follow.  Yecheskel Kurek, LCSW

## 2014-07-07 NOTE — Progress Notes (Signed)
Physical Therapy Session Note  Patient Details  Name: [name] MRN: [id number] Date of Birth: August 24, 1951  Today's Date: 07/07/2014 PT Individual Time: 1000-1100 PT Individual Time Calculation (min): 60 min   Short Term Goals: Week 2:  PT Short Term Goal 1 (Week 2): STGs=LTGs due to anticipated LOS  Skilled Therapeutic Interventions/Progress Updates:    Patient received sitting in wheelchair. Session focused on alertness/arousal, initiation, sustained attention, and command following with functional mobility tasks. Patient performed functional ambulation 305'x1 with R HHA only. Noted decreased step/stride length and decreased R foot clearance on carpeted surfaces. 26 min spent on floor transfer once patient transferred standing to tall kneeling with B UEs on mat. Patient initially refusing to transfer to supine on mat, but eventually agreeable. Due to increased lethargy with laying down, patient requires max multimodal cues to transfer supine to quadruped with min guard. Quadruped>tall kneeling with supervision and patient with increasing irritation, yelling at therapist and pounding fist on mat. Patient refusing assistance from therapist and attempting to "army crawl" upper body onto mat, despite max multimodal cues for proper sequencing. Patient eventually able to follow commands from therapist and requires modA for half kneeling>standing>sit on mat. Functional ambulation >150' x2 to ADL apartment and to perform simulated car transfer.  Furniture transfers from low, cushioned sofa with supervision, car transfer with supervision after cues for sequencing as patient attempts to get into car via SLS method. Patient returned to room and left seated in wheelchair with seatbelt donned and all needs within reach.  Therapy Documentation Precautions:  Precautions Precautions: Fall Precaution Comments: due to instability in standing.  Restrictions Weight Bearing Restrictions: No Pain: Pain  Assessment Pain Assessment: No/denies pain Pain Score: 0-No pain Locomotion : Ambulation Ambulation/Gait Assistance: 4: Min guard   See FIM for current functional status  Therapy/Group: Individual Therapy  Lillia Abed. Rollen Selders, PT, DPT 07/07/2014, 10:59 AM

## 2014-07-07 NOTE — Plan of Care (Signed)
Problem: RH BOWEL ELIMINATION Goal: RH STG MANAGE BOWEL W/MEDICATION W/ASSISTANCE STG Manage Bowel with Medication with Assistance.  Outcome: Progressing 3mL Sorbitol given 07/07/14

## 2014-07-07 NOTE — Progress Notes (Signed)
[place] PHYSICAL MEDICINE & REHABILITATION     PROGRESS NOTE    Subjective/Complaints: No new issues. Slept well. Cooperates with therapy. Delayed responses Able to place lunch order  Objective: Vital Signs: Blood pressure 99/56, pulse 77, temperature 99.1 F (37.3 C), temperature source Oral, resp. rate 19, height 5\' 1"  (1.549 m), weight 53.7 kg (118 lb 6.2 oz), SpO2 94.00%. No results found. No results found for this basename: WBC, HGB, HCT, PLT,  in the last 72 hours No results found for this basename: NA, K, CL, CO, GLUCOSE, BUN, CREATININE, CALCIUM,  in the last 72 hours CBG (last 3)   Recent Labs  07/06/14 1629 07/06/14 2039 07/07/14 0655  GLUCAP 83 130* 88    Wt Readings from Last 3 Encounters:  07/06/14 53.7 kg (118 lb 6.2 oz)  06/23/14 59.1 kg (130 lb 4.7 oz)  06/23/14 59.1 kg (130 lb 4.7 oz)    Physical Exam:  General: no distress. Head shaven on left  Eyes: EOM are normal  HEENT: edentulous, oral mucosa pink/moist.  Neck: Normal range of motion. Neck supple. No thyromegaly present.  Cardiovascular: Normal rate and regular rhythm. Systolic murmur  Respiratory: Effort normal and breath sounds normal. No respiratory distress.  GI: Soft. Bowel sounds are normal. She exhibits no distension.  Neurological:  Patient is alert and makes good eye contact with examiner. Continued delay in processing, better with cueing. She was able to provide her name, age and date of birth.  Less echolalic. Visual acuity fair. Seems to scan to all visual fields. Strength 4/5 bilateral deltoid, bicep, tricep, HI. LE: 3+/5 HF, 4 KE and   4/5 with ADF/APF.  RLE----inconsistent. DTR's 1+.  Skin:  Craniotomy site with intact with staples removed, no drainage   Assessment/Plan: 1. Functional deficits secondary to metastatic lung cancer to the brain which require 3+ hours per day of interdisciplinary therapy in a comprehensive inpatient rehab setting. Physiatrist is providing close team  supervision and 24 hour management of active medical problems listed below. Physiatrist and rehab team continue to assess barriers to discharge/monitor patient progress toward functional and medical goals. FIM: FIM - Bathing Bathing Steps Patient Completed: Chest;Right Arm;Left Arm;Abdomen;Right upper leg;Left upper leg;Front perineal area;Buttocks;Right lower leg (including foot);Left lower leg (including foot) Bathing: 5: Supervision: Safety issues/verbal cues  FIM - Upper Body Dressing/Undressing Upper body dressing/undressing steps patient completed: Thread/unthread right sleeve of pullover shirt/dresss;Thread/unthread left sleeve of pullover shirt/dress;Put head through opening of pull over shirt/dress;Pull shirt over trunk Upper body dressing/undressing: 5: Set-up assist to: Obtain clothing/put away FIM - Lower Body Dressing/Undressing Lower body dressing/undressing steps patient completed: Thread/unthread right pants leg;Thread/unthread left pants leg;Pull pants up/down;Fasten/unfasten pants;Don/Doff right sock;Don/Doff left sock;Don/Doff right shoe;Don/Doff left shoe Lower body dressing/undressing: 4: Steadying Assist  FIM - Toileting Toileting steps completed by patient: Adjust clothing prior to toileting;Performs perineal hygiene;Adjust clothing after toileting Toileting Assistive Devices: Grab bar or rail for support Toileting: 4: Steadying assist  FIM - Radio producer Devices: Elevated toilet seat;Grab bars Toilet Transfers: 4-To toilet/BSC: Min A (steadying Pt. > 75%);4-From toilet/BSC: Min A (steadying Pt. > 75%)  FIM - Bed/Chair Transfer Bed/Chair Transfer Assistive Devices: Arm rests Bed/Chair Transfer: 5: Bed > Chair or W/C: Supervision (verbal cues/safety issues);5: Chair or W/C > Bed: Supervision (verbal cues/safety issues)  FIM - Locomotion: Wheelchair Distance: 100' Locomotion: Wheelchair: 0: Activity did not occur FIM - Locomotion:  Ambulation Locomotion: Ambulation Assistive Devices: Other (comment) (R HHA) Ambulation/Gait Assistance: 4: Min guard;5: Supervision  Locomotion: Ambulation: 4: Travels 150 ft or more with minimal assistance (Pt.>75%)  Comprehension Comprehension Mode: Auditory Comprehension: 3-Understands basic 50 - 74% of the time/requires cueing 25 - 50%  of the time  Expression Expression Mode: Verbal Expression: 3-Expresses basic 50 - 74% of the time/requires cueing 25 - 50% of the time. Needs to repeat parts of sentences.  Social Interaction Social Interaction Mode: Asleep Social Interaction: 2-Interacts appropriately 25 - 49% of time - Needs frequent redirection.  Problem Solving Problem Solving Mode: Asleep Problem Solving: 2-Solves basic 25 - 49% of the time - needs direction more than half the time to initiate, plan or complete simple activities  Memory Memory Mode: Asleep Memory: 3-Recognizes or recalls 50 - 74% of the time/requires cueing 25 - 49% of the time  Medical Problem List and Plan:  1. Functional deficits secondary to left frontal and occipital brain tumors (metastatic lung) status post resection 06/23/2014  -remove staples.  2. DVT Prophylaxis/Anticoagulation: SCDs. Monitor for any signs of DVT  3. Pain Management: Hydrocodone as needed. Monitor with increased mobility  4. Mood/bipolar disorder: Risperdal 4 mg each bedtime. Mood stable at present 5. Neuropsych: This patient is not capable of making decisions on her own behalf.  6. Skin/Wound Care: Routine skin checks monitor craniotomy incision  7. Fluids/Electrolytes/Nutrition:   Provide nutritional supplements as needed    -labwork and elecrolytes within normal range thus far 8. Seizure prophylaxis. Keppra 500 mg twice a day  9. Diabetes mellitus. Glucophage 1000 mg twice a day at home  -sugars remain low despite decreased glucophage---hold all together now  -decadron off LOS (Days) 9 A FACE TO FACE EVALUATION WAS  PERFORMED  KIRSTEINS,ANDREW E 07/07/2014 10:02 AM

## 2014-07-08 ENCOUNTER — Encounter (HOSPITAL_COMMUNITY): Payer: Medicaid Other

## 2014-07-08 ENCOUNTER — Ambulatory Visit: Payer: Self-pay | Admitting: Radiation Oncology

## 2014-07-08 ENCOUNTER — Inpatient Hospital Stay (HOSPITAL_COMMUNITY): Payer: Medicaid Other | Admitting: *Deleted

## 2014-07-08 ENCOUNTER — Inpatient Hospital Stay (HOSPITAL_COMMUNITY): Payer: Medicaid Other | Admitting: Speech Pathology

## 2014-07-08 DIAGNOSIS — D496 Neoplasm of unspecified behavior of brain: Secondary | ICD-10-CM

## 2014-07-08 DIAGNOSIS — E119 Type 2 diabetes mellitus without complications: Secondary | ICD-10-CM

## 2014-07-08 LAB — GLUCOSE, CAPILLARY
Glucose-Capillary: 88 mg/dL (ref 70–99)
Glucose-Capillary: 97 mg/dL (ref 70–99)
Glucose-Capillary: 111 mg/dL — ABNORMAL HIGH (ref 70–99)
Glucose-Capillary: 86 mg/dL (ref 70–99)

## 2014-07-08 NOTE — Progress Notes (Signed)
Occupational Therapy Session Note  Patient Details  Name: [name] MRN: [id number] Date of Birth: Feb 25, 1951  Today's Date: 07/08/2014 OT Individual Time: 0700-0800 OT Individual Time Calculation (min): 60 min    Short Term Goals: Week 2:  OT Short Term Goal 1 (Week 2): Pt will complete sponge bath with supervision and min verbal cues OT Short Term Goal 2 (Week 2): Pt will complete dressing tasks with supervision and min verbal cues OT Short Term Goal 3 (Week 2): Pt will complete toileting tasks with supervision and min verbal cues  Skilled Therapeutic Interventions/Progress Updates:    Pt resting in bed upon arrival and agreeable to participating in therapy this morning.  Pt amb with RW to bathroom to use toilet prior to bathing and dressing with sit<>stand from w/c at sink.  Pt required extra time to complete all bathing and dressing tasks.  Pt required mod verbal cues for task initiation and redirection to task.  Pt frequently stopped task and and stared at herself in mirror.  When asked what she was looking at pt responded she was looking at her hair.  Pt continues to require steady A when standing for toileting and LB dressing secondary to occasional LOB with delayed righting reactions.  Focus on activity tolerance, functional amb with RW, dynamic standing balance, task initiation, sequencing, and attention to task.  Therapy Documentation Precautions:  Precautions Precautions: Fall Precaution Comments: due to instability in standing.  Restrictions Weight Bearing Restrictions: No Pain: Pain Assessment Pain Assessment: No/denies pain  See FIM for current functional status  Therapy/Group: Individual Therapy  Leroy Libman 07/08/2014, 8:03 AM

## 2014-07-08 NOTE — Progress Notes (Signed)
[place] PHYSICAL MEDICINE & REHABILITATION     PROGRESS NOTE    Subjective/Complaints: Working with SLP, no c/os r  Objective: Vital Signs: Blood pressure 103/63, pulse 83, temperature 98 F (36.7 C), temperature source Oral, resp. rate 18, height 5\' 1"  (1.549 m), weight 53.7 kg (118 lb 6.2 oz), SpO2 98.00%. No results found. No results found for this basename: WBC, HGB, HCT, PLT,  in the last 72 hours No results found for this basename: NA, K, CL, CO, GLUCOSE, BUN, CREATININE, CALCIUM,  in the last 72 hours CBG (last 3)   Recent Labs  07/07/14 1618 07/07/14 2043 07/08/14 0717  GLUCAP 120* 181* 88    Wt Readings from Last 3 Encounters:  07/06/14 53.7 kg (118 lb 6.2 oz)  06/23/14 59.1 kg (130 lb 4.7 oz)  06/23/14 59.1 kg (130 lb 4.7 oz)    Physical Exam:  General: no distress. Head shaven on left  Eyes: EOM are normal  HEENT: edentulous, oral mucosa pink/moist.  Neck: Normal range of motion. Neck supple. No thyromegaly present.  Cardiovascular: Normal rate and regular rhythm. Systolic murmur  Respiratory: Effort normal and breath sounds normal. No respiratory distress.  GI: Soft. Bowel sounds are normal. She exhibits no distension.  Neurological:  Patient is alert and makes good eye contact with examiner. Continued delay in processing, better with cueing. She was able to provide her name, age and date of birth. Seems to scan to all visual fields. Strength 4/5 bilateral deltoid, bicep, tricep, HI. LE: 3+/5 HF, 4 KE and   4/5 with ADF/APF.  RLE----inconsistent. DTR's 1+.  Skin:  Craniotomy site with intact with staples removed, no drainage   Assessment/Plan: 1. Functional deficits secondary to metastatic lung cancer to the brain which require 3+ hours per day of interdisciplinary therapy in a comprehensive inpatient rehab setting. Physiatrist is providing close team supervision and 24 hour management of active medical problems listed below. Physiatrist and rehab team  continue to assess barriers to discharge/monitor patient progress toward functional and medical goals. FIM: FIM - Bathing Bathing Steps Patient Completed: Chest;Right Arm;Left Arm;Abdomen;Right upper leg;Left upper leg;Front perineal area;Buttocks;Right lower leg (including foot);Left lower leg (including foot) Bathing: 5: Supervision: Safety issues/verbal cues  FIM - Upper Body Dressing/Undressing Upper body dressing/undressing steps patient completed: Thread/unthread right sleeve of pullover shirt/dresss;Thread/unthread left sleeve of pullover shirt/dress;Put head through opening of pull over shirt/dress;Pull shirt over trunk Upper body dressing/undressing: 5: Set-up assist to: Obtain clothing/put away FIM - Lower Body Dressing/Undressing Lower body dressing/undressing steps patient completed: Thread/unthread right pants leg;Thread/unthread left pants leg;Pull pants up/down;Fasten/unfasten pants;Don/Doff right sock;Don/Doff left sock;Don/Doff right shoe;Don/Doff left shoe Lower body dressing/undressing: 4: Steadying Assist  FIM - Toileting Toileting steps completed by patient: Adjust clothing prior to toileting;Performs perineal hygiene;Adjust clothing after toileting Toileting Assistive Devices: Grab bar or rail for support Toileting: 4: Steadying assist  FIM - Radio producer Devices: Elevated toilet seat;Grab bars Toilet Transfers: 4-To toilet/BSC: Min A (steadying Pt. > 75%);4-From toilet/BSC: Min A (steadying Pt. > 75%)  FIM - Bed/Chair Transfer Bed/Chair Transfer Assistive Devices: Arm rests Bed/Chair Transfer: 5: Bed > Chair or W/C: Supervision (verbal cues/safety issues);5: Chair or W/C > Bed: Supervision (verbal cues/safety issues)  FIM - Locomotion: Wheelchair Distance: 100' Locomotion: Wheelchair: 0: Activity did not occur FIM - Locomotion: Ambulation Locomotion: Ambulation Assistive Devices: Other (comment) (R HHA) Ambulation/Gait Assistance:  4: Min guard Locomotion: Ambulation: 4: Travels 150 ft or more with minimal assistance (Pt.>75%)  Comprehension Comprehension Mode:  Auditory Comprehension: 3-Understands basic 50 - 74% of the time/requires cueing 25 - 50%  of the time  Expression Expression Mode: Verbal Expression: 2-Expresses basic 25 - 49% of the time/requires cueing 50 - 75% of the time. Uses single words/gestures.  Social Interaction Social Interaction Mode: Asleep Social Interaction: 2-Interacts appropriately 25 - 49% of time - Needs frequent redirection.  Problem Solving Problem Solving Mode: Asleep Problem Solving: 2-Solves basic 25 - 49% of the time - needs direction more than half the time to initiate, plan or complete simple activities  Memory Memory Mode: Asleep Memory: 3-Recognizes or recalls 50 - 74% of the time/requires cueing 25 - 49% of the time  Medical Problem List and Plan:  1. Functional deficits secondary to left frontal and occipital brain tumors (metastatic lung) status post resection 06/23/2014  -remove staples.  2. DVT Prophylaxis/Anticoagulation: SCDs. Monitor for any signs of DVT  3. Pain Management: Hydrocodone as needed. Monitor with increased mobility  4. Mood/bipolar disorder: Risperdal 4 mg each bedtime. Mood stable at present 5. Neuropsych: This patient is not capable of making decisions on her own behalf.  6. Skin/Wound Care: Routine skin checks monitor craniotomy incision  7. Fluids/Electrolytes/Nutrition:   Provide nutritional supplements as needed    -labwork and elecrolytes within normal range thus far 8. Seizure prophylaxis. Keppra 500 mg twice a day  9. Diabetes mellitus. Glucophage 1000 mg twice a day at home  -sugars remain low despite decreased glucophage---hold all together now  -decadron off LOS (Days) 10 A FACE TO FACE EVALUATION WAS PERFORMED  [name] E 07/08/2014 8:22 AM

## 2014-07-08 NOTE — Progress Notes (Signed)
Physical Therapy Session Note  Patient Details  Name: [name] MRN: [id number] Date of Birth: November 27, 1950  Today's Date: 07/08/2014 PT Individual Time: 1430-1530 PT Individual Time Calculation (min): 60 min   Short Term Goals: Week 2:  PT Short Term Goal 1 (Week 2): STGs=LTGs due to anticipated LOS  Skilled Therapeutic Interventions/Progress Updates:    Patient received semi-reclined in bed. Session focused on functional transfers and functional ambulation around unit and to day room to partake in [employer]. Patient performs all functional ambulation with R HHA only without LOB, >150' x3; requires cues to look ahead when becoming distracted and looking in other rooms. Patient able to walk around food table, gather plate and napkin and put food on plate. Patient consumed snacks with supervision from SLP and mod cues for small bites.   In gym, attempted to being letter ball cognitive task, but patient reports the need to use bathroom. In bathroom, patient able to manage all clothing with min guard to close supervision and sit on toilet. Patient continent of bladder this episode, but incontinent of bladder in brief (from previously?) and totalA for brief change. Patient left semi-reclined in bed with all needs within reach and bed alarm on.  Therapy Documentation Precautions:  Precautions Precautions: Fall Precaution Comments: due to instability in standing.  Restrictions Weight Bearing Restrictions: No Pain: Pain Assessment Pain Assessment: No/denies pain Pain Score: 0-No pain Locomotion : Ambulation Ambulation/Gait Assistance: 4: Min guard (R HHA only)   See FIM for current functional status  Therapy/Group: Individual Therapy  [name], PT, DPT 07/08/2014, 4:34 PM

## 2014-07-08 NOTE — Progress Notes (Signed)
Speech Language Pathology Daily Session Notes  Patient Details  Name: [name] MRN: [id number] Date of Birth: 1950-12-11  Today's Date: 07/08/2014  Session 1: SLP Individual Time: [id number] SLP Individual Time Calculation (min): 30 min  Session 2: SLP Individual Time: 7035-0093 SLP Individual Time Calculation (min): 30 min  Short Term Goals: Week 2: SLP Short Term Goal 1 (Week 2): Patient will consume least restrictive diet with Mod A multimodal cues for utilization of swallowing compensatory strategies to minimize overt s/s of aspiration. SLP Short Term Goal 2 (Week 2): Pt will increase overall speech intelligibility by increased vocal intensity and over-articulation at the phrase level with Mod A multimodal cues.  SLP Short Term Goal 3 (Week 2): Pt will sustain attention to basic functional tasks for 5 minutes with Mod A multimodal cues.  SLP Short Term Goal 4 (Week 2): Pt will demonstrate functional problem solving for basic and familiar tasks with Mod A multimodal cues.  SLP Short Term Goal 5 (Week 2): Pt will utilize external visual aids to recall new, daily information with Mod A multimodal cues.  SLP Short Term Goal 6 (Week 2): Pt will use call bell to request assistance with Max A multimodal cues.   Skilled Therapeutic Interventions:  Session 1: Skilled treatment session focused on dysphagia goals. SLP facilitated session by providing Min A verbal cues for functional problem solving with tray set-up and supervision verbal cues for utilization of small bites/sips. Patient without overt s/s of aspiration but continues to require full supervision to decrease impulsivity and maximize problem solving with self-feeding. Patient left in wheelchair with quick release belt in place and all needs within reach. Continue with current plan of care.     Session 2: Skilled treatment session focused on dysphagia goals. SLP facilitated session by providing Mod A verbal cues for utilization of  small bites/sips with lunch meal of regular textures with thin liquids. Patient demonstrated overt cough X 1, suspect due to laughing with food in her oral cavity. Patient also required supervision verbal cues for problem solving with tray set-up. Patient handed off to RN to assist with finishing her meal. Continue with current plan of care.   FIM:  Comprehension Comprehension Mode: Auditory Comprehension: 3-Understands basic 50 - 74% of the time/requires cueing 25 - 50%  of the time Expression Expression Mode: Verbal Expression: 2-Expresses basic 25 - 49% of the time/requires cueing 50 - 75% of the time. Uses single words/gestures. Social Interaction Social Interaction: 2-Interacts appropriately 25 - 49% of time - Needs frequent redirection. Problem Solving Problem Solving: 2-Solves basic 25 - 49% of the time - needs direction more than half the time to initiate, plan or complete simple activities Memory Memory: 3-Recognizes or recalls 50 - 74% of the time/requires cueing 25 - 49% of the time FIM - Eating Eating Activity: 5: Supervision/cues  Pain Pain Assessment Pain Assessment: No/denies pain  Therapy/Group: Individual Therapy  [name], [place] 07/08/2014, 3:31 PM

## 2014-07-09 ENCOUNTER — Inpatient Hospital Stay (HOSPITAL_COMMUNITY): Payer: Medicaid Other | Admitting: *Deleted

## 2014-07-09 LAB — GLUCOSE, CAPILLARY
GLUCOSE-CAPILLARY: 85 mg/dL (ref 70–99)
Glucose-Capillary: 84 mg/dL (ref 70–99)
Glucose-Capillary: 98 mg/dL (ref 70–99)
Glucose-Capillary: 117 mg/dL — ABNORMAL HIGH (ref 70–99)

## 2014-07-09 NOTE — Progress Notes (Signed)
Physical Therapy Note  Patient Details  Name: KYNDAL HERINGER MRN: [id number] Date of Birth: 04-10-1951 Today's Date: 07/09/2014    Time: [phone] 45 minutes  1:1 No c/o pain.  Pt performed dressing tasks with close supervision/min A for balance, cues for initiation.  Gait in controlled environment with min A, pt requires min A to correct LOB when changing directions.  Standing balance/coordination task with tapping colored circles.  Pt initially able to perform sequence of 3 colors to tap with supervision, as pt fatigued, she required min/mod cuing and min A for balance to complete 3 sequence task.  Pt made bed in ADL apartment for sequencing, sustained attention, balance.  Pt able to perform with supervision for balance, frequent cues for safety during task.  Pt then needed to use restroom.  Pt had been incontinent of bladder in brief.  Pt able to perform clothing management for toileting with supervision.   Darryon Bastin 07/09/2014, 10:25 AM

## 2014-07-09 NOTE — Progress Notes (Signed)
Patient ID: [name], female   DOB: 15-Oct-1950, 63 y.o.   MRN: [id number]   [place] PHYSICAL MEDICINE & REHABILITATION     PROGRESS NOTE   07/09/14.  63 y/o admit for CIR with functional deficits secondary to metastatic lung cancer to the brain   Subjective/Complaints: Working with SLP, no c/os   Past Medical History  Diagnosis Date  . Bipolar disorder   . Cancer     small call lung cancer    Patient Vitals for the past 24 hrs:  BP Temp Temp src Pulse Resp SpO2 Weight  07/09/14 0700 - - - - - - 53.2 kg (117 lb 4.6 oz)  07/09/14 0517 127/70 mmHg 98.9 F (37.2 C) Oral 89 17 100 % -  07/08/14 1651 109/67 mmHg 99 F (37.2 C) Oral 85 16 100 % -     Intake/Output Summary (Last 24 hours) at 07/09/14 0926 Last data filed at 07/08/14 1759  Gross per 24 hour  Intake     10 ml  Output      0 ml  Net     10 ml   CBG (last 3)   Recent Labs  07/08/14 1650 07/08/14 2050 07/09/14 0659  GLUCAP 111* 86 85     Objective: Vital Signs: Blood pressure 127/70, pulse 89, temperature 98.9 F (37.2 C), temperature source Oral, resp. rate 17, height 5\' 1"  (1.549 m), weight 53.2 kg (117 lb 4.6 oz), SpO2 100.00%. No results found. No results found for this basename: WBC, HGB, HCT, PLT,  in the last 72 hours No results found for this basename: NA, K, CL, CO, GLUCOSE, BUN, CREATININE, CALCIUM,  in the last 72 hours CBG (last 3)   Recent Labs  07/08/14 1650 07/08/14 2050 07/09/14 0659  GLUCAP 111* 86 85    Wt Readings from Last 3 Encounters:  07/09/14 53.2 kg (117 lb 4.6 oz)  06/23/14 59.1 kg (130 lb 4.7 oz)  06/23/14 59.1 kg (130 lb 4.7 oz)    Physical Exam:  General: no distress. Head shaven on left ; feeding self Eyes: EOM are normal  HEENT: edentulous, oral mucosa pink/moist.  Neck: Normal range of motion. Neck supple. No thyromegaly present.  Cardiovascular: Normal rate and regular rhythm. Systolic murmur  Respiratory: Effort normal and breath sounds normal. No  respiratory distress.  GI: Soft. Bowel sounds are normal. She exhibits no distension.  Neurological:  Patient is alert and makes good eye contact with examiner. Continued delay in processing, better with cueing.  Skin:  Craniotomy site with intact with staples removed, no drainage Extremities- no edema; SCDs in place   Assessment/Plan: 1. Functional deficits secondary to metastatic lung cancer to the brain 2. DVT Prophylaxis/Anticoagulation: SCDs. Monitor for any signs of DVT  3. Pain Management: Hydrocodone as needed. Monitor with increased mobility  4. Mood/bipolar disorder: Risperdal 4 mg each bedtime. Mood stable at present 5. Seizure prophylaxis. Keppra 500 mg twice a day  6. Diabetes mellitus. Glucophage 1000 mg twice a day at home  -sugars remain low despite decreased glucophage---hold all together now  -decadron off LOS (Days) 11 A FACE TO FACE EVALUATION WAS PERFORMED  Nyoka Cowden 07/09/2014 9:24 AM

## 2014-07-09 NOTE — Plan of Care (Signed)
Problem: RH BLADDER ELIMINATION Goal: RH STG MANAGE BLADDER WITH ASSISTANCE STG Manage Bladder With min Assistance  Outcome: Not Progressing incont x2

## 2014-07-10 ENCOUNTER — Encounter (HOSPITAL_COMMUNITY): Payer: Medicaid Other | Admitting: Occupational Therapy

## 2014-07-10 DIAGNOSIS — E119 Type 2 diabetes mellitus without complications: Secondary | ICD-10-CM | POA: Insufficient documentation

## 2014-07-10 LAB — GLUCOSE, CAPILLARY
Glucose-Capillary: 85 mg/dL (ref 70–99)
Glucose-Capillary: 98 mg/dL (ref 70–99)
Glucose-Capillary: 109 mg/dL — ABNORMAL HIGH (ref 70–99)
Glucose-Capillary: 112 mg/dL — ABNORMAL HIGH (ref 70–99)

## 2014-07-10 NOTE — Progress Notes (Signed)
Patient ID: JAMAIA BRUM, female   DOB: [birth year], 63 y.o.   MRN: [id number]  Patient ID: ANYELI HOCKENBURY, female   DOB: 1951/03/05, 63 y.o.   MRN: [id number]   [facility]     PROGRESS NOTE  07/10/14.   63 y/o admit for CIR with functional deficits secondary to metastatic lung cancer to the brain   Subjective/Complaints: Working with SLP, no c/os   Past Medical History  Diagnosis Date  . Bipolar disorder   . Cancer     small call lung cancer    Patient Vitals for the past 24 hrs:  BP Temp Temp src Pulse Resp SpO2  07/10/14 0523 110/68 mmHg 98.4 F (36.9 C) Oral 77 18 99 %  07/09/14 1414 114/77 mmHg 98.6 F (37 C) Oral 99 17 93 %    No intake or output data in the 24 hours ending 07/10/14 0831 CBG (last 3)   Recent Labs  07/09/14 1653 07/09/14 2041 07/10/14 0656  GLUCAP 98 117* 85     Objective: Vital Signs: Blood pressure 110/68, pulse 77, temperature 98.4 F (36.9 C), temperature source Oral, resp. rate 18, height 5\' 1"  (1.549 m), weight 53.2 kg (117 lb 4.6 oz), SpO2 99 %. No results found. No results for input(s): WBC, HGB, HCT, PLT in the last 72 hours. No results for input(s): NA, K, CL, GLUCOSE, BUN, CREATININE, CALCIUM in the last 72 hours.  Invalid input(s): CO CBG (last 3)   Recent Labs  07/09/14 1653 07/09/14 2041 07/10/14 0656  GLUCAP 98 117* 85    Wt Readings from Last 3 Encounters:  07/09/14 53.2 kg (117 lb 4.6 oz)  06/23/14 59.1 kg (130 lb 4.7 oz)  12/19/06 80.876 kg (178 lb 4.8 oz)    Physical Exam:  General: no distress. Head shaven on left ; sitting on the side of bed and  feeding self Eyes: EOM are normal  HEENT: edentulous, oral mucosa pink/moist.  Neck: Normal range of motion. Neck supple. No thyromegaly present.  Cardiovascular: Normal rate and regular rhythm. Systolic murmur  Respiratory: Effort normal and breath sounds normal. No respiratory distress.  GI: Soft. Bowel sounds are normal. She  exhibits no distension.  Neurological:  Patient is alert and makes good eye contact with examiner.  Skin:  Craniotomy site with intact with staples removed, no drainage Extremities- no edema; SCDs in place   Assessment/Plan: 1. Functional deficits secondary to metastatic lung cancer to the brain 2. DVT Prophylaxis/Anticoagulation: SCDs. Monitor for any signs of DVT  3. Pain Management: Hydrocodone as needed. Monitor with increased mobility  4. Mood/bipolar disorder: Risperdal 4 mg each bedtime. Mood stable at present 5. Seizure prophylaxis. Keppra 500 mg twice a day  6. Diabetes mellitus. Glucophage 1000 mg twice a day at home  -sugars remain low despite decreased glucophage---hold all together now  -decadron off LOS (Days) 12 A FACE TO FACE EVALUATION WAS PERFORMED  Nyoka Cowden 07/10/2014 8:31 AM

## 2014-07-10 NOTE — Plan of Care (Signed)
Problem: RH BOWEL ELIMINATION Goal: RH STG MANAGE BOWEL WITH ASSISTANCE STG Manage Bowel with Assistance. Mod I  Outcome: Not Progressing Offered laxative but patient refused; patient wants to wait

## 2014-07-11 ENCOUNTER — Inpatient Hospital Stay (HOSPITAL_COMMUNITY): Payer: Medicaid Other

## 2014-07-11 ENCOUNTER — Encounter: Payer: Self-pay | Admitting: Oncology

## 2014-07-11 ENCOUNTER — Inpatient Hospital Stay (HOSPITAL_COMMUNITY): Payer: Medicaid Other | Admitting: Speech Pathology

## 2014-07-11 LAB — URINALYSIS, ROUTINE W REFLEX MICROSCOPIC
Bilirubin Urine: NEGATIVE
Glucose, UA: NEGATIVE mg/dL
KETONES UR: NEGATIVE mg/dL
LEUKOCYTES UA: NEGATIVE
NITRITE: NEGATIVE
PROTEIN: NEGATIVE mg/dL
Specific Gravity, Urine: 1.024 (ref 1.005–1.030)
UROBILINOGEN UA: 1 mg/dL (ref 0.0–1.0)
pH: 6 (ref 5.0–8.0)

## 2014-07-11 LAB — BASIC METABOLIC PANEL
Anion gap: 9 (ref 5–15)
BUN: 13 mg/dL (ref 6–23)
CHLORIDE: 101 meq/L (ref 96–112)
CO2: 30 meq/L (ref 19–32)
Calcium: 8.5 mg/dL (ref 8.4–10.5)
Creatinine, Ser: 0.52 mg/dL (ref 0.50–1.10)
GFR calc Af Amer: >90 mL/min (ref >90)
GFR calc non Af Amer: >90 mL/min (ref >90)
Glucose, Bld: 65 mg/dL — ABNORMAL LOW (ref 70–99)
POTASSIUM: 3.8 meq/L (ref 3.7–5.3)
Sodium: 140 mEq/L (ref 137–147)

## 2014-07-11 LAB — CBC
HEMATOCRIT: 34.8 % — AB (ref 36.0–46.0)
HEMOGLOBIN: 12 g/dL (ref 12.0–15.0)
MCH: 29.2 pg (ref 26.0–34.0)
MCHC: 34.5 g/dL (ref 30.0–36.0)
MCV: 84.7 fL (ref 78.0–100.0)
Platelets: 223 10*3/uL (ref 150–400)
RBC: 4.11 MIL/uL (ref 3.87–5.11)
RDW: 13.6 % (ref 11.5–15.5)
WBC: 3.8 10*3/uL — ABNORMAL LOW (ref 4.0–10.5)

## 2014-07-11 LAB — GLUCOSE, CAPILLARY
GLUCOSE-CAPILLARY: 100 mg/dL — AB (ref 70–99)
GLUCOSE-CAPILLARY: 230 mg/dL — AB (ref 70–99)
GLUCOSE-CAPILLARY: 80 mg/dL (ref 70–99)
GLUCOSE-CAPILLARY: 128 mg/dL — AB (ref 70–99)

## 2014-07-11 LAB — URINE MICROSCOPIC-ADD ON

## 2014-07-11 NOTE — Progress Notes (Signed)
Speech Language Pathology Daily Session Note  Patient Details  Name: [name] MRN: [id number] Date of Birth: 27-Feb-1951  Today's Date: 07/11/2014 SLP Individual Time: 5427-0623 SLP Individual Time Calculation (min): 45 min  Short Term Goals: Week 2: SLP Short Term Goal 1 (Week 2): Patient will consume least restrictive diet with Mod A multimodal cues for utilization of swallowing compensatory strategies to minimize overt s/s of aspiration. SLP Short Term Goal 2 (Week 2): Pt will increase overall speech intelligibility by increased vocal intensity and over-articulation at the phrase level with Mod A multimodal cues.  SLP Short Term Goal 3 (Week 2): Pt will sustain attention to basic functional tasks for 5 minutes with Mod A multimodal cues.  SLP Short Term Goal 4 (Week 2): Pt will demonstrate functional problem solving for basic and familiar tasks with Mod A multimodal cues.  SLP Short Term Goal 5 (Week 2): Pt will utilize external visual aids to recall new, daily information with Mod A multimodal cues.  SLP Short Term Goal 6 (Week 2): Pt will use call bell to request assistance with Max A multimodal cues.   Skilled Therapeutic Interventions: Skilled treatment session focused on cognitive-linguistic goals. Upon arrival, patient was awhile while supine in bed but was agreeable to participate in treatment session. SLP facilitated session by providing extra time for transfer from the bed to the wheelchair and Mod A multimodal cues for problem solving with a basic money management task. Patient also required Max A multimodal cues to recall rules to task due to decreased selective attention in a moderately distracting environment. Patient transferred back to bed at end of session with bed alarm on and all needs within reach. Continue with current plan of care.    FIM:  Comprehension Comprehension Mode: Auditory Comprehension: 3-Understands basic 50 - 74% of the time/requires cueing 25 - 50%  of  the time Expression Expression Mode: Verbal Expression: 3-Expresses basic 50 - 74% of the time/requires cueing 25 - 50% of the time. Needs to repeat parts of sentences. Social Interaction Social Interaction: 2-Interacts appropriately 25 - 49% of time - Needs frequent redirection. Problem Solving Problem Solving: 3-Solves basic 50 - 74% of the time/requires cueing 25 - 49% of the time Memory Memory: 2-Recognizes or recalls 25 - 49% of the time/requires cueing 51 - 75% of the time  Pain Pain Assessment Pain Assessment: No/denies pain  Therapy/Group: Individual Therapy  Lien Lyman 07/11/2014, 3:34 PM

## 2014-07-11 NOTE — Progress Notes (Signed)
[place] [facility]     PROGRESS NOTE    Subjective/Complaints: No new issues. Slept well. Cooperates with therapy. Delayed responses Able to place lunch order  Objective: Vital Signs: Blood pressure 108/70, pulse 91, temperature 98.5 F (36.9 C), temperature source Oral, resp. rate 16, height 5\' 1"  (1.549 m), weight 51.4 kg (113 lb 5.1 oz), SpO2 96 %. No results found. No results for input(s): WBC, HGB, HCT, PLT in the last 72 hours. No results for input(s): NA, K, CL, GLUCOSE, BUN, CREATININE, CALCIUM in the last 72 hours.  Invalid input(s): CO CBG (last 3)   Recent Labs  07/10/14 1637 07/10/14 2050 07/11/14 0630  GLUCAP 109* 112* 100*    Wt Readings from Last 3 Encounters:  07/11/14 51.4 kg (113 lb 5.1 oz)  06/23/14 59.1 kg (130 lb 4.7 oz)  12/19/06 80.876 kg (178 lb 4.8 oz)    Physical Exam:  General: no distress. Head shaven on left  Eyes: EOM are normal  HEENT: edentulous, oral mucosa pink/moist.  Patient is alert and makes good eye contact with examiner. Continued delay in processing, better with cueing. less echolalic. Visual acuity fair. Seems to scan to all visual fields.Craniotomy site with intact with staples removed, no drainage   Assessment/Plan: 1. Functional deficits secondary to metastatic lung cancer to the brain which require 3+ hours per day of interdisciplinary therapy in a comprehensive inpatient rehab setting. Physiatrist is providing close team supervision and 24 hour management of active medical problems listed below. Physiatrist and rehab team continue to assess barriers to discharge/monitor patient progress toward functional and medical goals. FIM: FIM - Bathing Bathing Steps Patient Completed: Chest, Right Arm, Left Arm, Abdomen, Right upper leg, Left upper leg, Front perineal area, Buttocks, Right lower leg (including foot), Left lower leg (including foot) Bathing: 5: Supervision: Safety issues/verbal cues  FIM  - Upper Body Dressing/Undressing Upper body dressing/undressing steps patient completed: Thread/unthread right sleeve of pullover shirt/dresss, Thread/unthread left sleeve of pullover shirt/dress, Put head through opening of pull over shirt/dress, Pull shirt over trunk Upper body dressing/undressing: 5: Set-up assist to: Obtain clothing/put away FIM - Lower Body Dressing/Undressing Lower body dressing/undressing steps patient completed: Thread/unthread right pants leg, Thread/unthread left pants leg, Pull pants up/down, Fasten/unfasten pants, Don/Doff right sock, Don/Doff left sock, Don/Doff right shoe, Don/Doff left shoe Lower body dressing/undressing: 4: Steadying Assist  FIM - Toileting Toileting steps completed by patient: Performs perineal hygiene (wearing gown) Toileting Assistive Devices: Grab bar or rail for support Toileting: 5: Supervision: Safety issues/verbal cues  FIM - Radio producer Devices: Product manager Transfers: 5-To toilet/BSC: Supervision (verbal cues/safety issues), 5-From toilet/BSC: Supervision (verbal cues/safety issues)  FIM - Control and instrumentation engineer Devices: Arm rests, HOB elevated, Bed rails Bed/Chair Transfer: 5: Supine > Sit: Supervision (verbal cues/safety issues), 5: Sit > Supine: Supervision (verbal cues/safety issues), 5: Bed > Chair or W/C: Supervision (verbal cues/safety issues), 5: Chair or W/C > Bed: Supervision (verbal cues/safety issues)  FIM - Locomotion: Wheelchair Distance: 100' Locomotion: Wheelchair: 0: Activity did not occur FIM - Locomotion: Ambulation Locomotion: Ambulation Assistive Devices: Other (comment) (R HHA) Ambulation/Gait Assistance: 4: Min guard Locomotion: Ambulation: 4: Travels 150 ft or more with minimal assistance (Pt.>75%)  Comprehension Comprehension Mode: Auditory Comprehension: 3-Understands basic 50 - 74% of the time/requires cueing 25 - 50%  of the  time  Expression Expression Mode: Verbal Expression: 2-Expresses basic 25 - 49% of the time/requires cueing 50 - 75% of the  time. Uses single words/gestures.  Social Interaction Social Interaction Mode: Asleep Social Interaction: 2-Interacts appropriately 25 - 49% of time - Needs frequent redirection.  Problem Solving Problem Solving Mode: Asleep Problem Solving: 2-Solves basic 25 - 49% of the time - needs direction more than half the time to initiate, plan or complete simple activities  Memory Memory Mode: Asleep Memory: 3-Recognizes or recalls 50 - 74% of the time/requires cueing 25 - 49% of the time  Medical Problem List and Plan:  1. Functional deficits secondary to left frontal and occipital brain tumors (metastatic lung) status post resection 06/23/2014  -remove staples.  2. DVT Prophylaxis/Anticoagulation: SCDs. Monitor for any signs of DVT  3. Pain Management: Hydrocodone as needed. Monitor with increased mobility  4. Mood/bipolar disorder: Risperdal 4 mg each bedtime. Mood stable at present 5. Neuropsych: This patient is not capable of making decisions on her own behalf.  6. Skin/Wound Care: Routine skin checks monitor craniotomy incision  7. Fluids/Electrolytes/Nutrition:   Provide nutritional supplements as needed    -labwork and elecrolytes within normal range thus far 8. Seizure prophylaxis. Keppra 500 mg twice a day  9. Diabetes mellitus. Glucophage 1000 mg twice a day at home  -sugars remain low despite decreased glucophage---hold all together now  -decadron off LOS (Days) 13 A FACE TO FACE EVALUATION WAS PERFORMED  Charlett Blake 07/11/2014 9:47 AM

## 2014-07-11 NOTE — Progress Notes (Signed)
Physical Therapy Session Note  Patient Details  Name: [name] MRN: [id number] Date of Birth: Jan 24, 1951  Today's Date: 07/11/2014 PT Individual Time: Treatment Session 1: 2426-8341; Treatment Session 2: 9622-2979 PT Individual Time Calculation (min): Treatment Session 1: 45 min; Treatment Session 2: 68min  Short Term Goals: Week 2:  PT Short Term Goal 1 (Week 2): STGs=LTGs due to anticipated LOS  Skilled Therapeutic Interventions/Progress Updates:  Treatment Session 1:  1:1. Pt received semi-reclined in bed, ready for therapy. Focus this session on functional transfers and ambulation, balance as well as cognitive remediation. Pt req supervision for t/f sup<>sit and all t/f sit<>stand this session. Pt denying need to use bathroom or that brief was wet, required encouragement to use bathroom. Pt incontinent of urine and able to void again once on commode, supervision overall for toilet transfer and gown management but total A to change brief. Pt req mod cues to seq washing hands at sink.   Pt req min guard HHA for ambulation 165'x2 with cues for increased pace and attention to task due to distractibility in busy hallway. In busy therapy gym, pt engaged in searching for 8 horseshoes to target balance as well as selective attention to task. Pt able to accurately keep track number of horseshoes during task, but req consistent mod cues for selective attention to task. Pt req min HHA during ambulation around various obstacles in gym as well as to achieve quadruped when finding horseshoes, however, req max A 1x to prevent LOB when bumping into a step due to distractibility.   Pt left semi-reclined in bed w/ all needs in reach and bed alarm on.   Treatment Session 2:  1:1. Pt received sitting EOB, ready for therapy. Focus this session on cognitive remediation, functional ambulation and stair negotiation as well as toileting. Pt req supervision for all t/f sit<>stand and min guard HHA for ambulation  165'x2 and 200'x1. Therapist facilitating increased weight shift to L for improved step length on R. Pt demonstrating shuffled steps overall despite cues. Pt able to negotiate up/down 8 steps w/ B UE on single R rail, step-to pattern and min A. Pt req cues for seq and safety.   Pt engaged in basic pipe tree diagram, progressing to moderately hard pipe tree diagram req initial mod cues to start task, but able to complete with intermittent min cues. Pt with good sustained>selective attention to task in gym environment at seated level.   Attempted to engage pt in floor transfer, however, pt repeatedly declining to perform. Pt stated, "I'm not going to do that, because I won't be on the floor anymore." Brief education regarding fall prevention and safety. Pt did not appear receptive to information.   Pt denied need to use bathroom at start and end of session as well as that brief was not wet. Pt used bathroom at end of session and found to be incontinent of bladder. Pt req supervision for toilet transfer, but steadying A during pericare and management of gown. Total A to don new brief.  Pt assisted back to bed at end of session, left w/ all needs in reach and bed alarm on.   Therapy Documentation Precautions:  Precautions Precautions: Fall Precaution Comments: due to instability in standing.  Restrictions Weight Bearing Restrictions: No Pain: Pain Assessment Pain Assessment: No/denies pain  See FIM for current functional status  Therapy/Group: Individual Therapy  [name] 07/11/2014, 9:46 AM

## 2014-07-11 NOTE — Progress Notes (Signed)
Location/Histology of Brain Tumor: left frontal and left parietal occipital  Patient presented with symptoms of:  weight loss frequent falling and decreased mental status  Past or anticipated interventions, if any, per neurosurgery: 06/23/14 -Procedure: Left craniotomy for tumor resection;  Surgeon: Newman Pies, MD;  Location: [facility];  Service: Neurosurgery;  Laterality: Left;  Left craniotomy for tumor resection   Past or anticipated interventions, if any, per medical oncology: concurrent chemoradiation therapy 7 years ago  Dose of Decadron, if applicable: Not presently   Recent neurologic symptoms, if any:   Seizures: no  Headaches: no  Nausea: no  Dizziness/ataxia: no  Difficulty with hand coordination: no  Focal numbness/weakness: no  Visual deficits/changes: no  Confusion/Memory deficits: yes, intermittent  Painful bone metastases at present, if any: no  SAFETY ISSUES:  Prior radiation? 03/05/2007 through 08/12/200859.4 gray directed at the left central chest.  07/15/2007 through 08/10/2007 - prophylactic cranial irradiation totally 32.4 gray   Pacemaker/ICD? no  Possible current pregnancy? no  Is the patient on methotrexate? no  Additional Complaints / other details: Patient discharged to skilled nursing facility 07/14/14.

## 2014-07-11 NOTE — Progress Notes (Signed)
Occupational Therapy Session Note  Patient Details  Name: [name] MRN: [id number] Date of Birth: 03-25-1951  Today's Date: 07/11/2014 OT Individual Time: 0915-1000 OT Individual Time Calculation (min): 45 min    Short Term Goals: Week 1:  OT Short Term Goal 1 (Week 1): Sponge Bath: Supervision to include sit and stand with no more than 75% cueing  OT Short Term Goal 1 - Progress (Week 1): Met OT Short Term Goal 2 (Week 1): LB Dressing:  Supervision to include sit and stand with no more than 75% cueing  OT Short Term Goal 2 - Progress (Week 1): Met OT Short Term Goal 3 (Week 1): Toilet transfer: Supervision to include sit and stand with no more than 75% cueing  OT Short Term Goal 3 - Progress (Week 1): Met OT Short Term Goal 4 (Week 1): Toileting: Supervision to include sit and stand with no more than 75% cueing  OT Short Term Goal 4 - Progress (Week 1): Met OT Short Term Goal 5 (Week 1): Caregiver Education:  Min assist providing multi-modal cues during BADL tasks OT Short Term Goal 5 - Progress (Week 1): Discontinued (comment)  Skilled Therapeutic Interventions/Progress Updates:   Pt seen for ADL retraining with focus on initiation/sequencing, standing balance, functional mobility, and activity tolerance. Pt completed bathing requiring mod cues for initiation and sequencing of task. Pt required SBA-steady assist for standing balance during self-care tasks. Donned gown this AM d/t all pt's clothing being dirty. Ambulated to laundry room with min HHA to wash clothing. Pt required min cues for problem solving as she attempted to place entire back of landry into the washing machine. Pt returned to room and left supine in bed with all needs in reach.    Therapy Documentation Precautions:  Precautions Precautions: Fall Precaution Comments: due to instability in standing.  Restrictions Weight Bearing Restrictions: No General:   Vital Signs:   Pain: Pain Assessment Pain Assessment:  No/denies pain  See FIM for current functional status  Therapy/Group: Individual Therapy  Duayne Cal 07/11/2014, 10:07 AM

## 2014-07-12 ENCOUNTER — Inpatient Hospital Stay (HOSPITAL_COMMUNITY): Payer: Medicaid Other

## 2014-07-12 ENCOUNTER — Inpatient Hospital Stay (HOSPITAL_COMMUNITY): Payer: Medicaid Other | Admitting: Speech Pathology

## 2014-07-12 DIAGNOSIS — C7931 Secondary malignant neoplasm of brain: Principal | ICD-10-CM

## 2014-07-12 LAB — GLUCOSE, CAPILLARY
GLUCOSE-CAPILLARY: 144 mg/dL — AB (ref 70–99)
Glucose-Capillary: 76 mg/dL (ref 70–99)
Glucose-Capillary: 100 mg/dL — ABNORMAL HIGH (ref 70–99)
Glucose-Capillary: 83 mg/dL (ref 70–99)

## 2014-07-12 NOTE — Progress Notes (Signed)
Occupational Therapy Session Note  Patient Details  Name: [name] MRN: [id number] Date of Birth: [birth year]  Today's Date: 07/12/2014 OT Individual Time: 9937-1696 OT Individual Time Calculation (min): 60 min    Short Term Goals: Week 2:  OT Short Term Goal 1 (Week 2): Pt will complete sponge bath with supervision and min verbal cues OT Short Term Goal 2 (Week 2): Pt will complete dressing tasks with supervision and min verbal cues OT Short Term Goal 3 (Week 2): Pt will complete toileting tasks with supervision and min verbal cues  Skilled Therapeutic Interventions/Progress Updates:    Pt seen for ADL retraining with focus on functional transfers, standing balance, task initiation/completion, attention, and safety awareness. Pt received supine in bed requesting to toilet. Ambulated to bathroom with CGA-SBA. Completed toilet task at supervision level with mod cues for management clothing up prior to ambulating to room. Completed bathing and dressing sit<>stand at sink. Pt required min-mod cues for initiation and sequencing of task. Pt requesting to eat breakfast before leaving room. Provided supervision for breakfast while engaging in therapeutic conversation with emphasis on intellectual and anticipatory awareness. Pt left sitting in w/c with all needs in reach.  Therapy Documentation Precautions:  Precautions Precautions: Fall Precaution Comments: due to instability in standing.  Restrictions Weight Bearing Restrictions: No General:   Vital Signs:  Pain: No report of pain during therapy session.  See FIM for current functional status  Therapy/Group: Individual Therapy  Duayne Cal 07/12/2014, 10:53 AM

## 2014-07-12 NOTE — Progress Notes (Signed)
[facility] PHYSICAL MEDICINE & REHABILITATION     PROGRESS NOTE    Subjective/Complaints: Up and alert. Denies pain. Wants to know when she can go home.   Objective: Vital Signs: Blood pressure 100/54, pulse 85, temperature 97.5 F (36.4 C), temperature source Oral, resp. rate 16, height 5\' 1"  (1.549 m), weight 52.5 kg (115 lb 11.9 oz), SpO2 94 %. Dg Chest 2 View  07/11/2014   CLINICAL DATA:  Cough.  History of small cell lung carcinoma.  EXAM: CHEST  2 VIEW  COMPARISON:  Chest CT, 06/18/14.  Chest radiograph, 06/17/2014.  FINDINGS: Band of well-defined opacity extends laterally from the aortic arch, similar to the prior CT and chest radiograph allowing for differences in patient positioning.  There are no areas of acute lung consolidation. No evidence of edema. No pleural effusion or pneumothorax.  Cardiac silhouette is normal in size. No mediastinal or right hilar masses.  Bony thorax is demineralized but grossly intact.  IMPRESSION: No acute cardiopulmonary disease. Stable appearance from the prior chest radiograph.   Electronically Signed   By: Lajean Manes M.D.   On: 07/11/2014 19:41    Recent Labs  07/11/14 1620  WBC 3.8*  HGB 12.0  HCT 34.8*  PLT 223    Recent Labs  07/11/14 1620  NA 140  K 3.8  CL 101  GLUCOSE 65*  BUN 13  CREATININE 0.52  CALCIUM 8.5   CBG (last 3)   Recent Labs  07/11/14 1740 07/11/14 2102 07/12/14 0650  GLUCAP 80 128* 76    Wt Readings from Last 3 Encounters:  07/12/14 52.5 kg (115 lb 11.9 oz)  06/23/14 59.1 kg (130 lb 4.7 oz)  12/19/06 80.876 kg (178 lb 4.8 oz)    Physical Exam:  General: no distress. Head shaven on left  Eyes: EOM are normal  HEENT: edentulous, oral mucosa pink/moist.  Patient is alert and makes good eye contact with examiner. Continued delay in processing, better with cueing.  Visual acuity fair. Seems to scan to all visual fields.  Skin: skin intact, no breakdown no drainage   Assessment/Plan: 1.  Functional deficits secondary to metastatic lung cancer to the brain which require 3+ hours per day of interdisciplinary therapy in a comprehensive inpatient rehab setting. Physiatrist is providing close team supervision and 24 hour management of active medical problems listed below. Physiatrist and rehab team continue to assess barriers to discharge/monitor patient progress toward functional and medical goals. FIM: FIM - Bathing Bathing Steps Patient Completed: Chest, Right Arm, Left Arm, Abdomen, Right upper leg, Left upper leg, Front perineal area, Buttocks, Right lower leg (including foot), Left lower leg (including foot) Bathing: 5: Supervision: Safety issues/verbal cues  FIM - Upper Body Dressing/Undressing Upper body dressing/undressing steps patient completed: Thread/unthread right sleeve of pullover shirt/dresss, Thread/unthread left sleeve of pullover shirt/dress, Put head through opening of pull over shirt/dress, Pull shirt over trunk Upper body dressing/undressing: 0: Wears gown/pajamas-no public clothing FIM - Lower Body Dressing/Undressing Lower body dressing/undressing steps patient completed: Thread/unthread right underwear leg, Thread/unthread left underwear leg, Pull underwear up/down, Don/Doff right sock, Don/Doff left sock Lower body dressing/undressing: 5: Supervision: Safety issues/verbal cues  FIM - Toileting Toileting steps completed by patient: Performs perineal hygiene (wearing gown) Toileting Assistive Devices: Grab bar or rail for support Toileting: 4: Steadying assist  FIM - Radio producer Devices: Grab bars Toilet Transfers: 5-To toilet/BSC: Supervision (verbal cues/safety issues), 5-From toilet/BSC: Supervision (verbal cues/safety issues)  FIM - Engineer, site  Assistive Devices: Arm rests, HOB elevated, Bed rails Bed/Chair Transfer: 5: Supine > Sit: Supervision (verbal cues/safety issues), 5: Sit > Supine:  Supervision (verbal cues/safety issues), 5: Bed > Chair or W/C: Supervision (verbal cues/safety issues), 5: Chair or W/C > Bed: Supervision (verbal cues/safety issues)  FIM - Locomotion: Wheelchair Distance: 100' Locomotion: Wheelchair: 0: Activity did not occur FIM - Locomotion: Ambulation Locomotion: Ambulation Assistive Devices: Other (comment) (R HHA) Ambulation/Gait Assistance: 4: Min guard Locomotion: Ambulation: 4: Travels 150 ft or more with minimal assistance (Pt.>75%)  Comprehension Comprehension Mode: Auditory Comprehension: 3-Understands basic 50 - 74% of the time/requires cueing 25 - 50%  of the time  Expression Expression Mode: Verbal Expression: 3-Expresses basic 50 - 74% of the time/requires cueing 25 - 50% of the time. Needs to repeat parts of sentences.  Social Interaction Social Interaction Mode: Asleep Social Interaction: 2-Interacts appropriately 25 - 49% of time - Needs frequent redirection.  Problem Solving Problem Solving Mode: Asleep Problem Solving: 3-Solves basic 50 - 74% of the time/requires cueing 25 - 49% of the time  Memory Memory Mode: Asleep Memory: 2-Recognizes or recalls 25 - 49% of the time/requires cueing 51 - 75% of the time  Medical Problem List and Plan:  1. Functional deficits secondary to left frontal and occipital brain tumors (metastatic lung) status post resection 06/23/2014  -remove staples.  2. DVT Prophylaxis/Anticoagulation: SCDs. Monitor for any signs of DVT  3. Pain Management: Hydrocodone as needed. Monitor with increased mobility  4. Mood/bipolar disorder: Risperdal 4 mg each bedtime. Mood stable at present 5. Neuropsych: This patient is not capable of making decisions on her own behalf.  6. Skin/Wound Care: Routine skin checks monitor craniotomy incision  7. Fluids/Electrolytes/Nutrition:   Provide nutritional supplements as needed    -labwork and elecrolytes within normal range thus far 8. Seizure prophylaxis. Keppra 500  mg twice a day  9. Diabetes mellitus. Glucophage 1000 mg twice a day at home (on now meds now)  -sugars remain generally under control except for an occasional bump  -decadron off LOS (Days) 14 A FACE TO FACE EVALUATION WAS PERFORMED  [name] T 07/12/2014 7:53 AM

## 2014-07-12 NOTE — Patient Care Conference (Addendum)
Inpatient RehabilitationTeam Conference and Plan of Care Update Date: 07/13/2014   Time: 2:45 PM    Patient Name: [name]      Medical Record Number: [id number]  Date of Birth: 1951/01/02 Sex: Female         Room/Bed: 4W20C/4W20C-01 Payor Info: Payor: MEDICAID [place] / Plan: MEDICAID [place] ACCESS / Product Type: *No Product type* /    Admitting Diagnosis: brain tumor resection  lung   Admit Date/Time:  06/28/2014  6:58 PM Admission Comments: No comment available   Primary Diagnosis:  <principal problem not specified> Principal Problem: <principal problem not specified>  Patient Active Problem List   Diagnosis Date Noted  . Diabetes mellitus type II, controlled   . Cerebral hemorrhage 06/18/2014  . Brain tumor 06/18/2014  . SMALL CELL CARCINOMA OF THE LUNG 12/19/2006  . 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 TOBACCO ABUSE 12/19/2006  . HYPERTENSION, BENIGN 12/19/2006    Expected Discharge Date: Expected Discharge Date:  (SNF)  Team Members Present: Physician leading conference: Dr. Alger Simons Social Worker Present: Lennart Pall, LCSW Nurse Present: Elliot Cousin, RN PT Present: Melene Plan, PT;Caroline Francesca Oman, PT OT Present: Gareth Morgan, OT SLP Present: Weston Anna, SLP Other (Discipline and Name): Danne Baxter, RN [facility]) PPS Coordinator present : Ileana Ladd, PT     Current Status/Progress Goal Weekly Team Focus  Medical   no changes. pain controlled. behavior can fluctutate  see prior  see prior, planning for xrt   Bowel/Bladder   Contnent of bowel and bladder, LBM 07/12/14  Patient to remain continent of bowel and bladder  Continue to monitor   Swallow/Nutrition/ Hydration   Regular textures with thin liquids, Full supervison with Mod-Max A for utilziation of small bites/sips   Mod A for utilziation of swallowing compensatory strategies  increased utilization of small bites/sips    ADL's   steady A-supervision with  self-care tasks and transfers, mod-max cues d/t impaired cogntiov  supervision overall  standing balance, cognitive remediaiton, activity tolerance, functional transfers, education   Mobility   Close(S)-min A; mod-max cues for cognition  Supervision overall  Functional endurance, cognitive remediation, balance, safety during functional transfers and ambulation   Communication   Mod-Max A  Mod A  increased initiation of verbal expression, word-finding    Safety/Cognition/ Behavioral Observations  Max A  Mod A  orientation, attention, initaition, problem solving    Pain   Patient denies pain, no paIn meds given since 07/07/14  pain less than of equal to 3 on a scals of 0-10  Assess pain q shift and prn   Skin   Incision well healed, staples removed, OTA.  No additional skin injury/breakdown  assess skin q shift    Rehab Goals Patient on target to meet rehab goals: Yes *See Care Plan and progress notes for long and short-term goals.  Barriers to Discharge: safety awareness, cognition, need for xrt    Possible Resolutions to Barriers:  supervision at dc.    Discharge Planning/Teaching Needs:  Plan now changed to SNF as family unable to provide 24/7 assist      Team Discussion:  Overall functioning still at supervision/ minimal assist.   SW continues to work on placement.  Slightly better participation from pt.  Scheduled for radiation simulation for end of week  Revisions to Treatment Plan:  None   Continued Need for Acute Rehabilitation Level of Care: The patient requires daily medical management by a physician with specialized training  in physical medicine and rehabilitation for the following conditions: Daily direction of a multidisciplinary physical rehabilitation program to ensure safe treatment while eliciting the highest outcome that is of practical value to the patient.: Yes Daily medical management of patient stability for increased activity during participation in an  intensive rehabilitation regime.: Yes Daily analysis of laboratory values and/or radiology reports with any subsequent need for medication adjustment of medical intervention for : Post surgical problems;Neurological problems  Nason Conradt 07/13/2014, 9:38 AM

## 2014-07-12 NOTE — Progress Notes (Signed)
Speech Language Pathology Daily Session Note  Patient Details  Name: [name] MRN: [id number] Date of Birth: 05/11/1951  Today's Date: 07/12/2014 SLP Individual Time: 0900-1000 SLP Individual Time Calculation (min): 60 min  Short Term Goals: Week 2: SLP Short Term Goal 1 (Week 2): Patient will consume least restrictive diet with Mod A multimodal cues for utilization of swallowing compensatory strategies to minimize overt s/s of aspiration. SLP Short Term Goal 2 (Week 2): Pt will increase overall speech intelligibility by increased vocal intensity and over-articulation at the phrase level with Mod A multimodal cues.  SLP Short Term Goal 3 (Week 2): Pt will sustain attention to basic functional tasks for 5 minutes with Mod A multimodal cues.  SLP Short Term Goal 4 (Week 2): Pt will demonstrate functional problem solving for basic and familiar tasks with Mod A multimodal cues.  SLP Short Term Goal 5 (Week 2): Pt will utilize external visual aids to recall new, daily information with Mod A multimodal cues.  SLP Short Term Goal 6 (Week 2): Pt will use call bell to request assistance with Max A multimodal cues.   Skilled Therapeutic Interventions: Skilled treatment session focused on cognitive-linguistic goals. Upon arrival, patient was sitting upright in w/c with quick release belt in place and was agreeable to participate in treatment session. Student facilitated session by providing Min A multimodal cues for orientation to date and time and Mod A multimodal cues for initiation, sustained attention and problem solving during a mildly complex new learning matching and scanning task. Student also facilitated session by providing Mod A semantic cues for wording finding throughout the activity, suspect increased cueing due to decreased divided attention to task in a mildly distracting environment. Patient also demonstrated ideational apraxia intermittently throughout the session. Patient demonstrated a  rattling cough with reports of chest pain intermittently throughout the session with an increased heart rate of 104 BPM, RN made aware. Session ended with student providing Min A multimodal cues for route finding back to patient's room. Patient left in w/c with quick release belt in place and all needs within reach. Continue with current plan of care.    FIM:  Comprehension Comprehension Mode: Auditory Comprehension: 3-Understands basic 50 - 74% of the time/requires cueing 25 - 50%  of the time Expression Expression Mode: Verbal Expression: 3-Expresses basic 50 - 74% of the time/requires cueing 25 - 50% of the time. Needs to repeat parts of sentences. Social Interaction Social Interaction: 2-Interacts appropriately 25 - 49% of time - Needs frequent redirection. Problem Solving Problem Solving: 3-Solves basic 50 - 74% of the time/requires cueing 25 - 49% of the time Memory Memory: 3-Recognizes or recalls 50 - 74% of the time/requires cueing 25 - 49% of the time  Pain Pain Assessment Pain Assessment: 0-10 Pain Score:  (unable to rate) Pain Type: Acute pain Pain Location: Chest Pain Orientation: Proximal Pain Descriptors / Indicators: Aching Pain Onset: On-going Pain Intervention(s): RN made aware;Rest  Therapy/Group: Individual Therapy  [name] 07/12/2014, 12:58 PM

## 2014-07-12 NOTE — Progress Notes (Signed)
Physical Therapy Session Note  Patient Details  Name: [name] MRN: [id number] Date of Birth: 1951-01-31  Today's Date: 07/12/2014 PT Individual Time: 1530-1630 PT Individual Time Calculation (min): 60 min   Short Term Goals: Week 2:  PT Short Term Goal 1 (Week 2): STGs=LTGs due to anticipated LOS  Skilled Therapeutic Interventions/Progress Updates:  1:1. Pt received sitting up in bed, ready for therapy. Pt prompted regarding need to use bathroom. Pt found to be incontinent of bladder, supervision for toileting and toilet transfer but total A to change brief. Focus this session on functional ambulation, cognitive remediation and balance. Pt req supervision for t/f sup<>sit and all t/f sit<>stand this session, min guard A with intermittent HHA for ambulation 175'x1 and 200'x1.   Pt engaged in seated card games with another pt of similar cognitive level to target selective attention, problem solving, mental flexibility, initiation and turn taking. Pt req max multimodal cues during new learning of "Spot it" game and min-mod multimodal cues during old learning of "War."   Pt engaged in letter ball toss with other pt to challenge cognition with dual task of standing on compliant surface to challenge balance. Pt req mod verbal cues to name objects starting with respective letter to target selective attention, memory and mental flexibility. Pt req mod-max A to maintain standing balance on compliant surface due to persistent posterior lean, exacerbated during cognitive task with significantly delayed initiation to correct balance.   Pt left sitting EOB at end of session in care of nurse tech.   Therapy Documentation Precautions:  Precautions Precautions: Fall Precaution Comments: due to instability in standing.  Restrictions Weight Bearing Restrictions: No  See FIM for current functional status  Therapy/Group: Individual Therapy  [name] 07/12/2014, 5:17 PM

## 2014-07-13 ENCOUNTER — Inpatient Hospital Stay (HOSPITAL_COMMUNITY): Payer: Medicaid Other | Admitting: Speech Pathology

## 2014-07-13 ENCOUNTER — Encounter (HOSPITAL_COMMUNITY): Payer: Medicaid Other

## 2014-07-13 ENCOUNTER — Inpatient Hospital Stay (HOSPITAL_COMMUNITY): Payer: Medicaid Other

## 2014-07-13 LAB — URINE CULTURE

## 2014-07-13 LAB — GLUCOSE, CAPILLARY
Glucose-Capillary: 84 mg/dL (ref 70–99)
Glucose-Capillary: 86 mg/dL (ref 70–99)
Glucose-Capillary: 156 mg/dL — ABNORMAL HIGH (ref 70–99)
Glucose-Capillary: 92 mg/dL (ref 70–99)

## 2014-07-13 NOTE — Progress Notes (Signed)
[facility] PHYSICAL MEDICINE & REHABILITATION     PROGRESS NOTE    Subjective/Complaints: Up with therapy. No new complaints this morning.    Objective: Vital Signs: Blood pressure 114/66, pulse 88, temperature 98.1 F (36.7 C), temperature source Oral, resp. rate 16, height 5\' 1"  (1.549 m), weight 52.9 kg (116 lb 10 oz), SpO2 99 %. Dg Chest 2 View  07/11/2014   CLINICAL DATA:  Cough.  History of small cell lung carcinoma.  EXAM: CHEST  2 VIEW  COMPARISON:  Chest CT, 06/18/14.  Chest radiograph, 06/17/2014.  FINDINGS: Band of well-defined opacity extends laterally from the aortic arch, similar to the prior CT and chest radiograph allowing for differences in patient positioning.  There are no areas of acute lung consolidation. No evidence of edema. No pleural effusion or pneumothorax.  Cardiac silhouette is normal in size. No mediastinal or right hilar masses.  Bony thorax is demineralized but grossly intact.  IMPRESSION: No acute cardiopulmonary disease. Stable appearance from the prior chest radiograph.   Electronically Signed   By: Lajean Manes M.D.   On: 07/11/2014 19:41    Recent Labs  07/11/14 1620  WBC 3.8*  HGB 12.0  HCT 34.8*  PLT 223    Recent Labs  07/11/14 1620  NA 140  K 3.8  CL 101  GLUCOSE 65*  BUN 13  CREATININE 0.52  CALCIUM 8.5   CBG (last 3)   Recent Labs  07/12/14 1634 07/12/14 2111 07/13/14 0638  GLUCAP 83 144* 84    Wt Readings from Last 3 Encounters:  07/13/14 52.9 kg (116 lb 10 oz)  06/23/14 59.1 kg (130 lb 4.7 oz)  12/19/06 80.876 kg (178 lb 4.8 oz)    Physical Exam:  General: no distress. Head shaven on left  Eyes: EOM are normal  HEENT: edentulous, oral mucosa pink/moist.  Patient is alert and makes good eye contact with examiner. Continued delay in processing, better with cueing.  Visual acuity fair. Seems to scan to all visual fields.  Skin: skin intact, no breakdown no drainage   Assessment/Plan: 1. Functional deficits  secondary to metastatic lung cancer to the brain which require 3+ hours per day of interdisciplinary therapy in a comprehensive inpatient rehab setting. Physiatrist is providing close team supervision and 24 hour management of active medical problems listed below. Physiatrist and rehab team continue to assess barriers to discharge/monitor patient progress toward functional and medical goals. FIM: FIM - Bathing Bathing Steps Patient Completed: Chest, Right Arm, Left Arm, Abdomen, Right upper leg, Left upper leg, Front perineal area, Buttocks, Right lower leg (including foot), Left lower leg (including foot) Bathing: 5: Supervision: Safety issues/verbal cues  FIM - Upper Body Dressing/Undressing Upper body dressing/undressing steps patient completed: Thread/unthread right sleeve of pullover shirt/dresss, Thread/unthread left sleeve of pullover shirt/dress, Put head through opening of pull over shirt/dress, Pull shirt over trunk Upper body dressing/undressing: 5: Set-up assist to: Obtain clothing/put away FIM - Lower Body Dressing/Undressing Lower body dressing/undressing steps patient completed: Thread/unthread right underwear leg, Thread/unthread left underwear leg, Pull underwear up/down, Don/Doff right sock, Don/Doff left sock, Thread/unthread right pants leg, Thread/unthread left pants leg, Pull pants up/down Lower body dressing/undressing: 5: Supervision: Safety issues/verbal cues  FIM - Toileting Toileting steps completed by patient: Performs perineal hygiene, Adjust clothing prior to toileting, Adjust clothing after toileting Toileting Assistive Devices: Grab bar or rail for support Toileting: 5: Supervision: Safety issues/verbal cues  FIM - Radio producer Devices: Grab bars Toilet Transfers: 5-To  toilet/BSC: Supervision (verbal cues/safety issues), 5-From toilet/BSC: Supervision (verbal cues/safety issues)  FIM - Engineer, site  Assistive Devices: Arm rests, Bed rails Bed/Chair Transfer: 5: Supine > Sit: Supervision (verbal cues/safety issues), 5: Bed > Chair or W/C: Supervision (verbal cues/safety issues)  FIM - Locomotion: Wheelchair Distance: 100' Locomotion: Wheelchair: 0: Activity did not occur FIM - Locomotion: Ambulation Locomotion: Ambulation Assistive Devices: Other (comment) (HHA) Ambulation/Gait Assistance: 4: Min guard Locomotion: Ambulation: 4: Travels 150 ft or more with minimal assistance (Pt.>75%)  Comprehension Comprehension Mode: Auditory Comprehension: 3-Understands basic 50 - 74% of the time/requires cueing 25 - 50%  of the time  Expression Expression Mode: Verbal Expression: 3-Expresses basic 50 - 74% of the time/requires cueing 25 - 50% of the time. Needs to repeat parts of sentences.  Social Interaction Social Interaction Mode: Asleep Social Interaction: 2-Interacts appropriately 25 - 49% of time - Needs frequent redirection.  Problem Solving Problem Solving Mode: Asleep Problem Solving: 3-Solves basic 50 - 74% of the time/requires cueing 25 - 49% of the time  Memory Memory Mode: Asleep Memory: 2-Recognizes or recalls 25 - 49% of the time/requires cueing 51 - 75% of the time  Medical Problem List and Plan:  1. Functional deficits secondary to left frontal and occipital brain tumors (metastatic lung) status post resection 06/23/2014 2. DVT Prophylaxis/Anticoagulation: SCDs. Monitor for any signs of DVT  3. Pain Management: Hydrocodone as needed. Monitor with increased mobility  4. Mood/bipolar disorder: Risperdal 4 mg each bedtime. Mood stable at present 5. Neuropsych: This patient is not capable of making decisions on her own behalf.  6. Skin/Wound Care: Routine skin checks monitor craniotomy incision  7. Fluids/Electrolytes/Nutrition:   Provide nutritional supplements as needed    -labwork and elecrolytes within normal range 8. Seizure prophylaxis. Keppra 500 mg twice a day  9.  Diabetes mellitus. Glucophage 1000 mg twice a day at home (on now meds now)  -sugars remain generally under control except for an occasional bump  -decadron off LOS (Days) 15 A FACE TO FACE EVALUATION WAS PERFORMED  Bruce Churilla T 07/13/2014 8:10 AM

## 2014-07-13 NOTE — Progress Notes (Signed)
Recreational Therapy Session Note  Patient Details  Name: [name] MRN: [id number] Date of Birth: 1951/02/18 Today's Date: 07/13/2014  Pain: no c/o Skilled Therapeutic Interventions/Progress Updates: Pt participated in animal assisted activity bed level with supervision.    Mariah Harn 07/13/2014, 5:53 PM

## 2014-07-13 NOTE — Plan of Care (Signed)
Problem: RH BOWEL ELIMINATION Goal: RH STG MANAGE BOWEL WITH ASSISTANCE STG Manage Bowel with Assistance. Mod I  Outcome: Progressing

## 2014-07-13 NOTE — Plan of Care (Signed)
Problem: RH BOWEL ELIMINATION Goal: RH STG MANAGE BOWEL WITH ASSISTANCE STG Manage Bowel with Assistance. Mod I  Outcome: Progressing Goal: RH STG MANAGE BOWEL W/MEDICATION W/ASSISTANCE STG Manage Bowel with Medication with Assistance.  Outcome: Progressing  Problem: RH BLADDER ELIMINATION Goal: RH STG MANAGE BLADDER WITH ASSISTANCE STG Manage Bladder With min Assistance  Outcome: Progressing  Problem: RH SKIN INTEGRITY Goal: RH STG SKIN FREE OF INFECTION/BREAKDOWN Skin to remain free from infection/breakdown while on rehab with Max assist  Outcome: Progressing Goal: RH STG MAINTAIN SKIN INTEGRITY WITH ASSISTANCE STG Maintain Skin Integrity With max Assistance.  Outcome: Progressing  Problem: RH SAFETY Goal: RH STG ADHERE TO SAFETY PRECAUTIONS W/ASSISTANCE/DEVICE STG Adhere to Safety Precautions With Min Assistance/Device.  Outcome: Progressing  Problem: RH COGNITION-NURSING Goal: RH STG USES MEMORY AIDS/STRATEGIES W/ASSIST TO PROBLEM SOLVE STG Uses Memory Aids/Strategies With Mod Assistance to Problem Solve.  Outcome: Progressing  Problem: RH PAIN MANAGEMENT Goal: RH STG PAIN MANAGED AT OR BELOW PT'S PAIN GOAL <3  Outcome: Progressing  Problem: RH KNOWLEDGE DEFICIT Goal: RH STG INCREASE KNOWLEDGE OF DIABETES Patient and caregiver will be able to verbalize s/s of hypo/hyperglycemia, dietary, and medication management with mod assist  Outcome: Progressing

## 2014-07-13 NOTE — Plan of Care (Signed)
Problem: RH BOWEL ELIMINATION Goal: RH STG MANAGE BOWEL W/MEDICATION W/ASSISTANCE STG Manage Bowel with Medication with Assistance.  Outcome: Progressing

## 2014-07-13 NOTE — Progress Notes (Signed)
Occupational Therapy Session Note  Patient Details  Name: [name] MRN: [id number] Date of Birth: 12-17-1950  Today's Date: 07/13/2014 OT Individual Time: 0700-0800 OT Individual Time Calculation (min): 60 min    Short Term Goals: Week 2:  OT Short Term Goal 1 (Week 2): Pt will complete sponge bath with supervision and min verbal cues OT Short Term Goal 2 (Week 2): Pt will complete dressing tasks with supervision and min verbal cues OT Short Term Goal 3 (Week 2): Pt will complete toileting tasks with supervision and min verbal cues  Skilled Therapeutic Interventions/Progress Updates:    Pt resting in bed upon arrival and agreeable to participating in therapy this morning.  Pt amb with RW to bathroom to use toilet.  Pt incontinent of bladder but attempted to void (unsuccessful). Pt amb with RW to sink for bathing and dressing tasks.  Pt completed tasks with supervision.  Pt attempted to don/thread pants while standing and required min verbal cues to sit in w/c to thread pants.  Pt continues to require adult diapers secondary to continued incontinence.  Pt requires increased time to complete all tasks with occasional verbal cues to redirect to task.  Focus on functional ambulation with RW, sit<>stand, standing balance, task initiation, attention to task, and safety awareness.  Therapy Documentation Precautions:  Precautions Precautions: Fall Precaution Comments: due to instability in standing.  Restrictions Weight Bearing Restrictions: No Pain: Pain Assessment Pain Assessment: No/denies pain  See FIM for current functional status  Therapy/Group: Individual Therapy  Leroy Libman 07/13/2014, 8:03 AM

## 2014-07-13 NOTE — Plan of Care (Signed)
Problem: RH SKIN INTEGRITY Goal: RH STG SKIN FREE OF INFECTION/BREAKDOWN Skin to remain free from infection/breakdown while on rehab with Max assist  Outcome: Progressing

## 2014-07-13 NOTE — Progress Notes (Signed)
Physical Therapy Weekly Progress Note  Patient Details  Name: [name] MRN: [id number] Date of Birth: Nov 27, 1950  Beginning of progress report period: July 06, 2014 End of progress report period: July 13, 2014  Today's Date: 07/13/2014 PT Individual Time: 0900-1000 PT Individual Time Calculation (min): 60 min   Pt continues to make slow, but steady progress and has achieved 3/12 long term goals. Pt currently requires supervision for bed mobility and basic transfers, supervision-min A for ambulation with use of RW or HHA, close(S)-min guard A for standing balance and min guard A for stair negotiation. Safety during standing mobility limited primarily by decreased balance and distractibility to environmental stimuli. Continued emphasis on cognitive remediation during tx sessions with mod-max cueing overall.  Patient continues to demonstrate the following deficits: sustained>selective attention, frustration tolerance, intellectual/emergent awareness, functional endurance, initiation, sequencing, balance, strength, safety during ambulation and transfers, and therefore will continue to benefit from skilled PT intervention to enhance overall performance with activity tolerance, balance, attention, awareness, transfers and mobility.   See Patient's Care Plan for progression toward long term goals. Patient progressing toward long term goals.  Continue plan of care.   Skilled Therapeutic Interventions/Progress Updates:  1:1. Pt received sitting in w/c, ready for therapy. Focus this session on functional ambulation, balance, cognitive remediation and therex. Pt req supervision for ambulation 160'x1 and 175'x1 with RW, cues for increased pace and attention due to distractibility in busy hallway environment.   Pt challenged to find 10 horseshoes in busy therapy gym to target selective attention, attention to R side, memory and problem solving. Pt req mod cues for cognition during task and min A for  balance with HHA. Pt engaged in horseshoe toss standing on compliant surface reaching to both L and R sides to challenge balance as well as facilitate increased anterior lean. Pt req max cues for selective attention and initiation during horseshoe toss.     Pt utilized NuStep for LE endurance and ROM, fair tolerance to level 2x34min. Pt req multimodal cues for increased and sustained pace as well as increased ROM.   Pt with minimal report of back pain throughout session, RN made aware. Pt req supervision for toileting at end of session, found to be incontinent of bowel. Total A to don new brief. Pt left sitting in w/c at end of session w/ all needs in reach, quick release belt in place. RN present to provide medicine.   Therapy Documentation Precautions:  Precautions Precautions: Fall Precaution Comments: due to instability in standing.  Restrictions Weight Bearing Restrictions: No   Pain: Pain Assessment Pain Assessment: 0-10 Pain Score: 5  Faces Pain Scale: Hurts little more Pain Type: Acute pain Pain Location: Back Pain Orientation: Mid Pain Descriptors / Indicators: Aching Pain Frequency: Intermittent Pain Onset: With Activity Patients Stated Pain Goal: 2 Pain Intervention(s): Medication (See eMAR)  See FIM for current functional status  Therapy/Group: Individual Therapy  [name] 07/13/2014, 12:17 PM

## 2014-07-13 NOTE — Plan of Care (Signed)
Problem: RH PAIN MANAGEMENT Goal: RH STG PAIN MANAGED AT OR BELOW PT'S PAIN GOAL <3  Outcome: Progressing

## 2014-07-13 NOTE — Plan of Care (Signed)
Problem: RH KNOWLEDGE DEFICIT Goal: RH STG INCREASE KNOWLEDGE OF DIABETES Patient and caregiver will be able to verbalize s/s of hypo/hyperglycemia, dietary, and medication management with mod assist  Outcome: Progressing

## 2014-07-13 NOTE — Progress Notes (Signed)
Speech Language Pathology Daily Session Note  Patient Details  Name: [name] MRN: [id number] Date of Birth: 12/20/1950  Today's Date: 07/13/2014 SLP Individual Time: 1100-1115 SLP Individual Time Calculation (min): 15 min and Today's Date: 07/13/2014 SLP Concurrent Time: 1115-1200 SLP Concurrent Time Calculation (min): 45 min   Short Term Goals: Week 2: SLP Short Term Goal 1 (Week 2): Patient will consume least restrictive diet with Mod A multimodal cues for utilization of swallowing compensatory strategies to minimize overt s/s of aspiration. SLP Short Term Goal 2 (Week 2): Pt will increase overall speech intelligibility by increased vocal intensity and over-articulation at the phrase level with Mod A multimodal cues.  SLP Short Term Goal 3 (Week 2): Pt will sustain attention to basic functional tasks for 5 minutes with Mod A multimodal cues.  SLP Short Term Goal 4 (Week 2): Pt will demonstrate functional problem solving for basic and familiar tasks with Mod A multimodal cues.  SLP Short Term Goal 5 (Week 2): Pt will utilize external visual aids to recall new, daily information with Mod A multimodal cues.  SLP Short Term Goal 6 (Week 2): Pt will use call bell to request assistance with Max A multimodal cues.   Skilled Therapeutic Interventions: Skilled treatment session focused on cognitive-linguistic goals. Upon arrival, patient was sitting upright in w/c and required encouragement for participation in therapy session. Student facilitated session by providing Min A multimodal cues for sustained attention, Mod A multimodal cues for problem solving and working memory and Max A multimodal cues for verbal expression during a mildly complex matching activity. Patient was Mod I for orientation to date, time and place but required Min A multimodal cues for recall of events during previous therapy sessions. Patient also independently requested wants/needs and required supervision question cues for  route finding back to her room to retrieve her drink. Patient left in w/c with quick release belt in place and all needs within reach. Continue with current plan of care.    FIM:  Comprehension Comprehension Mode: Auditory Comprehension: 3-Understands basic 50 - 74% of the time/requires cueing 25 - 50%  of the time Expression Expression Mode: Verbal Expression: 3-Expresses basic 50 - 74% of the time/requires cueing 25 - 50% of the time. Needs to repeat parts of sentences. Social Interaction Social Interaction: 2-Interacts appropriately 25 - 49% of time - Needs frequent redirection. Problem Solving Problem Solving: 3-Solves basic 50 - 74% of the time/requires cueing 25 - 49% of the time Memory Memory: 3-Recognizes or recalls 50 - 74% of the time/requires cueing 25 - 49% of the time  Pain Pain Assessment Pain Assessment: No/denies pain  Therapy/Group: Individual Therapy/Concurrent Therapy    Kyndall Allred, BS, Student-SLP assisted with this treatment session and documentation   [place]on Anna, [place], [place]

## 2014-07-13 NOTE — Plan of Care (Signed)
Problem: RH SAFETY Goal: RH STG ADHERE TO SAFETY PRECAUTIONS W/ASSISTANCE/DEVICE STG Adhere to Safety Precautions With Min Assistance/Device.  Outcome: Progressing

## 2014-07-13 NOTE — Plan of Care (Signed)
Problem: RH Balance Goal: LTG Patient will maintain dynamic sitting balance (PT) LTG: Patient will maintain dynamic sitting balance with assistance during mobility activities (PT)  Outcome: Completed/Met Date Met:  07/13/14  Problem: RH Bed Mobility Goal: LTG Patient will perform bed mobility with assist (PT) LTG: Patient will perform bed mobility with assistance, with/without cues (PT).  Outcome: Completed/Met Date Met:  07/13/14  Problem: RH Bed to Chair Transfers Goal: LTG Patient will perform bed/chair transfers w/assist (PT) LTG: Patient will perform bed/chair transfers with assistance, with/without cues (PT).  Outcome: Completed/Met Date Met:  07/13/14  Problem: RH Car Transfers Goal: LTG Patient will perform car transfers with assist (PT) LTG: Patient will perform car transfers with assistance (PT).  Outcome: Not Applicable Date Met:  93/81/01 Goal D/C, anticipated d/c to SNF.     Problem: RH Wheelchair Mobility Goal: LTG Patient will propel w/c in controlled environment (PT) LTG: Patient will propel wheelchair in controlled environment, # of feet with assist (PT)  Outcome: Not Applicable Date Met:  75/10/25 Goal D/C, pt at ambulatory level.

## 2014-07-13 NOTE — Plan of Care (Signed)
Problem: RH BLADDER ELIMINATION Goal: RH STG MANAGE BLADDER WITH ASSISTANCE STG Manage Bladder With min Assistance  Outcome: Progressing

## 2014-07-13 NOTE — Plan of Care (Signed)
Problem: RH SKIN INTEGRITY Goal: RH STG MAINTAIN SKIN INTEGRITY WITH ASSISTANCE STG Maintain Skin Integrity With max Assistance.  Outcome: Progressing

## 2014-07-13 NOTE — Plan of Care (Signed)
Problem: RH COGNITION-NURSING Goal: RH STG USES MEMORY AIDS/STRATEGIES W/ASSIST TO PROBLEM SOLVE STG Uses Memory Aids/Strategies With Mod Assistance to Problem Solve.  Outcome: Progressing

## 2014-07-14 ENCOUNTER — Inpatient Hospital Stay (HOSPITAL_COMMUNITY): Payer: Medicaid Other | Admitting: Physical Therapy

## 2014-07-14 ENCOUNTER — Inpatient Hospital Stay (HOSPITAL_COMMUNITY): Payer: Medicaid Other | Admitting: Occupational Therapy

## 2014-07-14 ENCOUNTER — Inpatient Hospital Stay (HOSPITAL_COMMUNITY): Payer: Medicaid Other | Admitting: Speech Pathology

## 2014-07-14 LAB — GLUCOSE, CAPILLARY
GLUCOSE-CAPILLARY: 93 mg/dL (ref 70–99)
GLUCOSE-CAPILLARY: 112 mg/dL — AB (ref 70–99)
Glucose-Capillary: 114 mg/dL — ABNORMAL HIGH (ref 70–99)
Glucose-Capillary: 109 mg/dL — ABNORMAL HIGH (ref 70–99)

## 2014-07-14 MED ORDER — GUAIFENESIN ER 600 MG PO TB12
600.0000 mg | ORAL_TABLET | Freq: Two times a day (BID) | ORAL | Status: DC
  Administered 2014-07-14 – 2014-07-15 (×2): 600 mg via ORAL
  Filled 2014-07-14 (×4): qty 1

## 2014-07-14 NOTE — Progress Notes (Signed)
Physical Therapy Session Note  Patient Details  Name: [name] MRN: [id number] Date of Birth: 12/13/1950  Today's Date: 07/14/2014 PT Individual Time: 8984-2103 PT Individual Time Calculation (min): 60 min   Short Term Goals: Week 3:  PT Short Term Goal 1 (Week 3): STGs=LTGs due to anticipated LOS  Skilled Therapeutic Interventions/Progress Updates:    Pt received seated in w/c in gym having just completed session with SLP. Pt replied "yes" when asked if she needed to use bathroom. Therefore, transported pt back to room in w/c with total A for energy conservation. Performed stand pivot transfer frim w/c<>toilet with grab bars and supervision. Pt unable to urinate. Therefore, returned to gym, where pt engaged in game of Connect 4 for sustained attention, problem solving. Longest trial of standing cognitive activity was 7 minutes, 15 seconds. Each standing trial ended due to LOB to R side. Transitioned to gait x100 in controlled environment with supervision to min A (L HHA with increased pt fatigue). Performed w/c mobility x25' in controlled environment with bilat UE's requiring supervision, increased time. NuStep x10 minutes for functional endurance; pt required cueing to maintain LUE on NuStep handle, to fully extend knees. Performed multiple stand pivot transfers throughout session with supervision. Session ended in pt room, where pt was left semi reclined in bed with 3 rails up, bed alarm on, and all needs within reach.  Therapy Documentation Precautions:  Precautions Precautions: Fall Precaution Comments: due to instability in standing.  Restrictions Weight Bearing Restrictions: No Vital Signs: Therapy Vitals Temp: 97.8 F (36.6 C) Temp Source: Oral Pulse Rate: 100 Resp: 17 BP: 117/69 mmHg Patient Position (if appropriate): Sitting Oxygen Therapy SpO2: 98 % O2 Device: Not Delivered Pain: Pain Assessment Pain Assessment: No/denies pain Locomotion  : Ambulation Ambulation/Gait Assistance: 4: Min assist Wheelchair Mobility Distance: 25'   See FIM for current functional status  Therapy/Group: Individual Therapy  Stefano Gaul 07/14/2014, 6:29 PM

## 2014-07-14 NOTE — Discharge Summary (Signed)
[name], [name] NO.:  [id number]  MEDICAL RECORD NO.:  [id number]  LOCATION:  [id number]                        FACILITY:  [facility]  PHYSICIAN:  Meredith Staggers, M.D.DATE OF BIRTH:  1951-07-13  DATE OF ADMISSION:  06/28/2014 DATE OF DISCHARGE:  07/15/2014                              DISCHARGE SUMMARY   DISCHARGE DIAGNOSES: 1. Functional deficits secondary to left frontal and occipital brain     tumors, metastatic lung status post resection on June 23, 2014. 2. Sequential compression devices for DVT prophylaxis. 3. Bipolar disorder. 4. Diabetes mellitus with peripheral neuropathy. 5. Decreased nutritional storage.  HISTORY OF PRESENT ILLNESS:  This is a 63 year old right-handed female with history of small cell lung cancer diagnosed 7 years ago, treated with radiation chemotherapy.  The patient lives with her brother, reportedly used a walker prior to admission.  Admitted on June 18, 2014, with altered mental status and recent falls.  Cranial CT scan demonstrated large frontal mass.  Workup included CT of the chest, abdomen, and pelvis, which was negative.  Attempts were made to get MRI, but she had some sort of metallic object, which caused a lot of artifact on the MRI imaging.  Old CT of the head demonstrated a large frontal left parietal occipital mass.  Underwent left frontal craniotomy for gross total resection of frontal brain tumor, left occipital craniotomy for resection of left occipital tumor on June 23, 2014, per Dr. Arnoldo Morale.  Placed on Keppra for seizure prophylaxis.  Followup MRI postoperatively showing no enhancing marginal tissues seen at the site of resection.  She remained on Decadron protocol and tapered off. Tolerating a regular diet.  Physical and occupational therapy ongoing. The patient was admitted for comprehensive rehab program.  PAST MEDICAL HISTORY:  See discharge diagnoses.  SOCIAL HISTORY:  Lives with her  brother.  FUNCTIONAL HISTORY:  Prior to admission, independent with a walker.  FUNCTIONAL STATUS:  Upon admission to rehab services, needs assist for overall transfer level ambulating 80 feet with a rolling walker, min-to- mod assist activities of daily living with ongoing cues for safety due decreased safety awareness.  PHYSICAL EXAMINATION:  VITAL SIGNS:  Blood pressure 152/71, pulse 78, temperature 97, respirations 18. GENERAL:  This was an alert female, made good eye contact with examiner. She does exhibit some delay in processing, better with cueing.  She was able to provide her name, age, date of birth, and had some difficulty with geographical information. EYES:  Pupils were round and reactive to light. LUNGS:  Clear to auscultation. CARDIAC:  Regular rate and rhythm. ABDOMEN:  Soft, nontender.  Good bowel sounds. NEUROLOGIC:  Craniotomy site healing nicely.  REHABILITATION HOSPITAL COURSE:  The patient was admitted to inpatient rehab services with therapies initiated on a 3-hour daily basis consisting of physical therapy, occupational therapy, speech therapy, and rehabilitation nursing.  The following issues were addressed during the patient's rehabilitation stay.  Pertaining to [name] left frontal occipital brain tumors metastatic lung status post resection on June 23, 2014.  Craniotomy site healing nicely.  She would follow up with Dr. Arnoldo Morale of Neurosurgery.  Arrangements were made to follow up with Dr. Sondra Come on July 15, 2014, for staging for planned radiation. Pain management with the use of hydrocodone and good results.  She did have a history of bipolar disorder.  She remained on Risperdal 4 mg at bedtime, close monitoring of her safety.  History of diabetes mellitus.  She had been on Glucophage prior to admission, on no current diabetic agents.  While in the hospital, she was monitored closely with her blood sugars while her Decadron was tapered to off.   The patient received weekly collaborative interdisciplinary team conferences to discuss estimated length of stay, family teaching, any barriers to her discharge.  The patient required supervision bed mobility, basic transfers, supervision minimal assist for ambulation with a rolling walker or handheld assistance, close supervision minimal guard assist for standing balance and minimal guard assist for stair negotiation.  Ongoing cues for safety during standing mobility.  Activities of daily living, she could ambulate with a rolling walker to the bathroom and use the toilet.  She had occasional incontinence of bladder with routine toileting schedules.  She could complete bathing and dressing tasks with supervision.  Due to limited assist at home, it was felt skilled nursing facility was needed.  The patient discharged to skilled nursing facility.  DISCHARGE MEDICATIONS: 1. Hydrocodone 1 tablet every 4 hours as needed moderate pain. 2. Keppra 500 mg p.o. b.i.d. 3. Protonix 40 mg p.o. daily. 4. Risperdal 4 mg p.o. at bedtime.  DIET:  Regular consistency 1800 calorie ADA diet.  SPECIAL INSTRUCTIONS:  The patient would follow up with Dr. Burney Gauze, Oncology Services, call for appointment; Dr. Gery Pray, Radiation Oncology on July 15, 2014, at 8:30 a.m., and plan simulation, Dr. Alger Simons at the outpatient rehab service office as directed.     Lauraine Rinne, P.A.   ______________________________ Meredith Staggers, M.D.    DA/MEDQ  D:  07/14/2014  T:  07/14/2014  Job:  [id number]  cc:   Blair Promise, Ph.D., M.D. Volanda Napoleon, M.D. Ophelia Charter, M.D.

## 2014-07-14 NOTE — Plan of Care (Signed)
Problem: RH BOWEL ELIMINATION Goal: RH STG MANAGE BOWEL WITH ASSISTANCE STG Manage Bowel with Assistance. Mod I  Outcome: Progressing Goal: RH STG MANAGE BOWEL W/MEDICATION W/ASSISTANCE STG Manage Bowel with Medication with Assistance.  Outcome: Progressing  Problem: RH BLADDER ELIMINATION Goal: RH STG MANAGE BLADDER WITH ASSISTANCE STG Manage Bladder With min Assistance  Outcome: Progressing  Problem: RH SKIN INTEGRITY Goal: RH STG SKIN FREE OF INFECTION/BREAKDOWN Skin to remain free from infection/breakdown while on rehab with Max assist  Outcome: Progressing Goal: RH STG MAINTAIN SKIN INTEGRITY WITH ASSISTANCE STG Maintain Skin Integrity With max Assistance.  Outcome: Progressing  Problem: RH SAFETY Goal: RH STG ADHERE TO SAFETY PRECAUTIONS W/ASSISTANCE/DEVICE STG Adhere to Safety Precautions With Min Assistance/Device.  Outcome: Progressing  Problem: RH COGNITION-NURSING Goal: RH STG USES MEMORY AIDS/STRATEGIES W/ASSIST TO PROBLEM SOLVE STG Uses Memory Aids/Strategies With Mod Assistance to Problem Solve.  Outcome: Progressing  Problem: RH PAIN MANAGEMENT Goal: RH STG PAIN MANAGED AT OR BELOW PT'S PAIN GOAL <3  Outcome: Progressing  Problem: RH KNOWLEDGE DEFICIT Goal: RH STG INCREASE KNOWLEDGE OF DIABETES Patient and caregiver will be able to verbalize s/s of hypo/hyperglycemia, dietary, and medication management with mod assist  Outcome: Progressing

## 2014-07-14 NOTE — Progress Notes (Signed)
[facility] PHYSICAL MEDICINE & REHABILITATION     PROGRESS NOTE    Subjective/Complaints: No complaints Objective: Vital Signs: Blood pressure 113/72, pulse 100, temperature 98.7 F (37.1 C), temperature source Oral, resp. rate 17, height 5\' 1"  (1.549 m), weight 55.1 kg (121 lb 7.6 oz), SpO2 97 %. No results found.  Recent Labs  07/11/14 1620  WBC 3.8*  HGB 12.0  HCT 34.8*  PLT 223    Recent Labs  07/11/14 1620  NA 140  K 3.8  CL 101  GLUCOSE 65*  BUN 13  CREATININE 0.52  CALCIUM 8.5   CBG (last 3)   Recent Labs  07/13/14 1639 07/13/14 2049 07/14/14 0634  GLUCAP 156* 92 114*    Wt Readings from Last 3 Encounters:  07/14/14 55.1 kg (121 lb 7.6 oz)  06/23/14 59.1 kg (130 lb 4.7 oz)  12/19/06 80.876 kg (178 lb 4.8 oz)    Physical Exam:  General: no distress. Head shaven on left  Eyes: EOM are normal  HEENT: edentulous, oral mucosa pink/moist.  Patient is alert and makes good eye contact with examiner. Continued delay in processing, better with cueing.  Visual acuity fair. Seems to scan to all visual fields.  Skin: skin intact, no breakdown no drainage   Assessment/Plan: 1. Functional deficits secondary to metastatic lung cancer to the brain which require 3+ hours per day of interdisciplinary therapy in a comprehensive inpatient rehab setting. Physiatrist is providing close team supervision and 24 hour management of active medical problems listed below. Physiatrist and rehab team continue to assess barriers to discharge/monitor patient progress toward functional and medical goals. FIM: FIM - Bathing Bathing Steps Patient Completed: Chest, Right Arm, Left Arm, Abdomen, Right upper leg, Left upper leg, Front perineal area, Buttocks, Right lower leg (including foot), Left lower leg (including foot) Bathing: 5: Supervision: Safety issues/verbal cues  FIM - Upper Body Dressing/Undressing Upper body dressing/undressing steps patient completed: Thread/unthread  right sleeve of pullover shirt/dresss, Thread/unthread left sleeve of pullover shirt/dress, Put head through opening of pull over shirt/dress, Pull shirt over trunk Upper body dressing/undressing: 5: Set-up assist to: Obtain clothing/put away FIM - Lower Body Dressing/Undressing Lower body dressing/undressing steps patient completed: Thread/unthread right underwear leg, Thread/unthread left underwear leg, Pull underwear up/down, Don/Doff right sock, Don/Doff left sock, Thread/unthread right pants leg, Thread/unthread left pants leg, Pull pants up/down Lower body dressing/undressing: 5: Supervision: Safety issues/verbal cues  FIM - Toileting Toileting steps completed by patient: Performs perineal hygiene, Adjust clothing prior to toileting, Adjust clothing after toileting Toileting Assistive Devices: Grab bar or rail for support Toileting: 5: Supervision: Safety issues/verbal cues  FIM - Radio producer Devices: Grab bars Toilet Transfers: 5-To toilet/BSC: Supervision (verbal cues/safety issues), 5-From toilet/BSC: Supervision (verbal cues/safety issues)  FIM - Control and instrumentation engineer Devices: Arm rests Bed/Chair Transfer: 5: Bed > Chair or W/C: Supervision (verbal cues/safety issues), 5: Chair or W/C > Bed: Supervision (verbal cues/safety issues)  FIM - Locomotion: Wheelchair Distance: 100' Locomotion: Wheelchair: 0: Activity did not occur FIM - Locomotion: Ambulation Locomotion: Ambulation Assistive Devices: Administrator Ambulation/Gait Assistance: 5: Supervision Locomotion: Ambulation: 5: Travels 150 ft or more with supervision/safety issues  Comprehension Comprehension Mode: Auditory Comprehension: 3-Understands basic 50 - 74% of the time/requires cueing 25 - 50%  of the time  Expression Expression Mode: Verbal Expression: 3-Expresses basic 50 - 74% of the time/requires cueing 25 - 50% of the time. Needs to repeat parts of  sentences.  Social Interaction  Social Interaction Mode: Asleep Social Interaction: 2-Interacts appropriately 25 - 49% of time - Needs frequent redirection.  Problem Solving Problem Solving Mode: Asleep Problem Solving: 3-Solves basic 50 - 74% of the time/requires cueing 25 - 49% of the time  Memory Memory Mode: Asleep Memory: 3-Recognizes or recalls 50 - 74% of the time/requires cueing 25 - 49% of the time  Medical Problem List and Plan:  1. Functional deficits secondary to left frontal and occipital brain tumors (metastatic lung) status post resection 06/23/2014 2. DVT Prophylaxis/Anticoagulation: SCDs. Monitor for any signs of DVT  3. Pain Management: Hydrocodone as needed. Monitor with increased mobility  4. Mood/bipolar disorder: Risperdal 4 mg each bedtime. Mood stable at present 5. Neuropsych: This patient is not capable of making decisions on her own behalf.  6. Skin/Wound Care: Routine skin checks monitor craniotomy incision  7. Fluids/Electrolytes/Nutrition:   Provide nutritional supplements as needed    -labwork and elecrolytes within normal range 8. Seizure prophylaxis. Keppra 500 mg twice a day  9. Diabetes mellitus. Glucophage 1000 mg twice a day at home (on now meds now)  -sugars remain generally under control except for an occasional bump  -decadron off LOS (Days) 16 A FACE TO FACE EVALUATION WAS PERFORMED  [name] T 07/14/2014 8:54 AM

## 2014-07-14 NOTE — Discharge Summary (Signed)
Discharge summary job # [id number]

## 2014-07-14 NOTE — Progress Notes (Signed)
Social Work Patient ID: [name], female   DOB: [birth year], 63 y.o.   MRN: [id number]   Continue to pursue SNF placement.  Hoping to secure a bed by first of next week at the latest.  Will keep staff posted.  Pt aware of this and agreeable.  Have attempted to reach brother, however, phone turned off and not taking messages.    I have spoken with RN and RN tech about plans for pt to be transferred early tomorrow morning via [facility] to [place] [facility].  [facility] likely to pick up @ 0730.  Therapies all to be scheduled for the afternoon.   Shiquan Mathieu, LCSW

## 2014-07-14 NOTE — Progress Notes (Signed)
Speech Language Pathology Weekly Progress and Session Note  Patient Details  Name: [name] MRN: [id number] Date of Birth: 02/07/1951  Beginning of progress report period: July 07, 2014 End of progress report period: July 14, 2014  Today's Date: 07/14/2014 SLP Individual Time: 1400-1500 SLP Individual Time Calculation (min): 60 min  Short Term Goals: Week 2: SLP Short Term Goal 1 (Week 2): Patient will consume least restrictive diet with Mod A multimodal cues for utilization of swallowing compensatory strategies to minimize overt s/s of aspiration. SLP Short Term Goal 1 - Progress (Week 2): Met SLP Short Term Goal 2 (Week 2): Pt will increase overall speech intelligibility by increased vocal intensity and over-articulation at the phrase level with Mod A multimodal cues.  SLP Short Term Goal 2 - Progress (Week 2): Met SLP Short Term Goal 3 (Week 2): Pt will sustain attention to basic functional tasks for 5 minutes with Mod A multimodal cues.  SLP Short Term Goal 3 - Progress (Week 2): Met SLP Short Term Goal 4 (Week 2): Pt will demonstrate functional problem solving for basic and familiar tasks with Mod A multimodal cues.  SLP Short Term Goal 4 - Progress (Week 2): Met SLP Short Term Goal 5 (Week 2): Pt will utilize external visual aids to recall new, daily information with Mod A multimodal cues.  SLP Short Term Goal 5 - Progress (Week 2): Met SLP Short Term Goal 6 (Week 2): Pt will use call bell to request assistance with Max A multimodal cues.  SLP Short Term Goal 6 - Progress (Week 2): Not met    New Short Term Goals: Week 3: SLP Short Term Goal 1 (Week 3): Patient will consume least restrictive diet with Min A multimodal cues for utilization of swallowing compensatory strategies to minimize overt s/s of aspiration. SLP Short Term Goal 2 (Week 3): Pt will increase overall speech intelligibility by increased vocal intensity and over-articulation at the phrase level with Min A  multimodal cues.  SLP Short Term Goal 3 (Week 3): Pt will sustain attention to basic functional tasks for 10 minutes with Min A multimodal cues.  SLP Short Term Goal 4 (Week 3): Pt will demonstrate functional problem solving for basic and familiar tasks with Min A multimodal cues.  SLP Short Term Goal 5 (Week 3): Pt will utilize external visual aids to recall new, daily information with Min A multimodal cues.  SLP Short Term Goal 6 (Week 3): Pt will use call bell to request assistance with Max A multimodal cues.   Weekly Progress Updates: Patient has made functional gains and has met 5 of 6 STG's this reporting period due to increased attention, problem solving, orientation, speech intelligibility and swallowing function.  Currently, patient is consuming regular textures with thin liquids without overt s/s of aspiration and requires Mod A multimodal cues for utilization of small bites/sips.  Patient also requires overall Min A multimodal cues for orientation and Mod A multimodal cues for sustained attention to functional tasks, initiation and problem solving. Patient can verbally express her wants/needs with 100% intelligibility at the word and phrase level but continues to demonstrate intermittent verbal errors with structured tasks. Patient's family unable to provide necessary cognitive and physical assistance required at this time, therefore, patient will discharge to SNF. Patient would benefit from continued skilled SLP intervention to maximize functional communication, swallowing function and her cognitive-linguistic function in order to maximize her overall functional independence.   Intensity: Minumum of 1-2 x/day, 30 to 90 minutes  Frequency: 5 out of 7 days Duration/Length of Stay: TBD due to SNF placement  Treatment/Interventions: Cognitive remediation/compensation;Cueing hierarchy;Internal/external aids;Environmental controls;Therapeutic Activities;Functional tasks;Patient/family  education   Daily Session Skilled Therapeutic Interventions: Skilled treatment session focused on cognitive-linguistic goals. Upon arrival, patient was supine in bed and was transferred to w/c. Patient was incontinent of urine with minimal awareness and required Mod A multimodal cues for  functional problem solving during self care tasks. Student facilitated session by providing Mod A multimodal cues for sustained attention and Max A multimodal cues for problem solving during a sequencing and addition task. Patient required Mod A multimodal cues for verbal expression and demonstrated semantic paraphasias intermittently throughout the session. Patient handed off to PT in w/c. Continue with current plan of care.       FIM:  Comprehension Comprehension Mode: Auditory Comprehension: 3-Understands basic 50 - 74% of the time/requires cueing 25 - 50%  of the time Expression Expression Mode: Verbal Expression: 3-Expresses basic 50 - 74% of the time/requires cueing 25 - 50% of the time. Needs to repeat parts of sentences. Social Interaction Social Interaction: 2-Interacts appropriately 25 - 49% of time - Needs frequent redirection. Problem Solving Problem Solving: 2-Solves basic 25 - 49% of the time - needs direction more than half the time to initiate, plan or complete simple activities Memory Memory: 3-Recognizes or recalls 50 - 74% of the time/requires cueing 25 - 49% of the time  Pain Pain Assessment Pain Assessment: No/denies pain  Therapy/Group: Individual Therapy  Renelle Stegenga 07/14/2014, 4:26 PM

## 2014-07-14 NOTE — Progress Notes (Signed)
Occupational Therapy Session Note  Patient Details  Name: [name] MRN: [id number] Date of Birth: 08-07-1951  Today's Date: 07/14/2014 OT Individual Time: 0900-1000 OT Individual Time Calculation (min): 60 min   Short Term Goals: Week 2:  OT Short Term Goal 1 (Week 2): Pt will complete sponge bath with supervision and min verbal cues OT Short Term Goal 2 (Week 2): Pt will complete dressing tasks with supervision and min verbal cues OT Short Term Goal 3 (Week 2): Pt will complete toileting tasks with supervision and min verbal cues  Skilled Therapeutic Interventions/Progress Updates:  Patient seated EOB with RN in the room.  Engaged in self care retraining to include dressing (declined bathing), simple HM tasks in her room, therapeutic activity in the day room.  Patient ambulated with RW to the bathroom to see if the clothes that were hanging up in the room were clean enough to wear today.  Patient began to get dressed instead of bathing first stating that she had already done that.  Patient required vcs to sit down to don her pants for safety regarding balance.  Patient ambulated with RW back to her bed to rest then ambulated to closet and drawers to sort through her clothes to assess what was in the dirty clothes and the options she has for clean clothes then she folded and organized the drawer with min cues while seated.  Patient ambulated w/o AD to water plants in the day room.  At times, patient required max vcs and min physical cues to stop adding water to the plants so as to not over water them.  Patient required 2 rest breaks for this task them ambulated back to her room to lay down.  Therapy Documentation Precautions:  Precautions Precautions: Fall Precaution Comments: due to instability in standing.  Restrictions Weight Bearing Restrictions: No Pain: Pain Assessment Pain Assessment: No/denies pain ADL: See FIM for current functional status  Therapy/Group: Individual  Therapy  Rafay Dahan 07/14/2014, 10:57 AM

## 2014-07-15 ENCOUNTER — Telehealth: Payer: Self-pay | Admitting: Oncology

## 2014-07-15 ENCOUNTER — Inpatient Hospital Stay (HOSPITAL_COMMUNITY): Payer: Medicaid Other | Admitting: Speech Pathology

## 2014-07-15 ENCOUNTER — Ambulatory Visit
Admit: 2014-07-15 | Discharge: 2014-07-15 | Disposition: A | Discharge status: Home / Self Care | Payer: Medicaid Other | Attending: Radiation Oncology | Admitting: Radiation Oncology

## 2014-07-15 ENCOUNTER — Inpatient Hospital Stay (HOSPITAL_COMMUNITY): Payer: Medicaid Other

## 2014-07-15 ENCOUNTER — Encounter: Payer: Self-pay | Admitting: Radiation Oncology

## 2014-07-15 VITALS — BP 70/50 | HR 110 | Temp 98.1°F | Resp 18 | Ht 61.0 in | Wt 123.0 lb

## 2014-07-15 DIAGNOSIS — D496 Neoplasm of unspecified behavior of brain: Secondary | ICD-10-CM | POA: Insufficient documentation

## 2014-07-15 DIAGNOSIS — Z510 Encounter for antineoplastic radiation therapy: Secondary | ICD-10-CM | POA: Insufficient documentation

## 2014-07-15 DIAGNOSIS — C3490 Malignant neoplasm of unspecified part of unspecified bronchus or lung: Secondary | ICD-10-CM | POA: Insufficient documentation

## 2014-07-15 DIAGNOSIS — F17210 Nicotine dependence, cigarettes, uncomplicated: Secondary | ICD-10-CM | POA: Insufficient documentation

## 2014-07-15 DIAGNOSIS — Z923 Personal history of irradiation: Secondary | ICD-10-CM | POA: Insufficient documentation

## 2014-07-15 DIAGNOSIS — Z794 Long term (current) use of insulin: Secondary | ICD-10-CM | POA: Insufficient documentation

## 2014-07-15 DIAGNOSIS — F319 Bipolar disorder, unspecified: Secondary | ICD-10-CM | POA: Insufficient documentation

## 2014-07-15 DIAGNOSIS — E119 Type 2 diabetes mellitus without complications: Secondary | ICD-10-CM | POA: Insufficient documentation

## 2014-07-15 DIAGNOSIS — Z79899 Other long term (current) drug therapy: Secondary | ICD-10-CM | POA: Insufficient documentation

## 2014-07-15 HISTORY — DX: Malignant neoplasm of brain, unspecified: C71.9

## 2014-07-15 LAB — GLUCOSE, CAPILLARY
Glucose-Capillary: 90 mg/dL (ref 70–99)
Glucose-Capillary: 143 mg/dL — ABNORMAL HIGH (ref 70–99)

## 2014-07-15 NOTE — Plan of Care (Signed)
Problem: RH BOWEL ELIMINATION Goal: RH STG MANAGE BOWEL WITH ASSISTANCE STG Manage Bowel with Assistance. Mod I  Outcome: Completed/Met Date Met:  07/15/14 Goal: RH STG MANAGE BOWEL W/MEDICATION W/ASSISTANCE STG Manage Bowel with Medication with Assistance.  Outcome: Completed/Met Date Met:  07/15/14  Problem: RH BLADDER ELIMINATION Goal: RH STG MANAGE BLADDER WITH ASSISTANCE STG Manage Bladder With min Assistance  Outcome: Completed/Met Date Met:  07/15/14  Problem: RH SKIN INTEGRITY Goal: RH STG SKIN FREE OF INFECTION/BREAKDOWN Skin to remain free from infection/breakdown while on rehab with Max assist  Outcome: Completed/Met Date Met:  07/15/14 Goal: RH STG MAINTAIN SKIN INTEGRITY WITH ASSISTANCE STG Maintain Skin Integrity With max Assistance.  Outcome: Completed/Met Date Met:  07/15/14  Problem: RH SAFETY Goal: RH STG ADHERE TO SAFETY PRECAUTIONS W/ASSISTANCE/DEVICE STG Adhere to Safety Precautions With Min Assistance/Device.  Outcome: Completed/Met Date Met:  07/15/14  Problem: RH COGNITION-NURSING Goal: RH STG USES MEMORY AIDS/STRATEGIES W/ASSIST TO PROBLEM SOLVE STG Uses Memory Aids/Strategies With Mod Assistance to Problem Solve.  Outcome: Completed/Met Date Met:  07/15/14  Problem: RH PAIN MANAGEMENT Goal: RH STG PAIN MANAGED AT OR BELOW PT'S PAIN GOAL <3  Outcome: Completed/Met Date Met:  07/15/14  Problem: RH KNOWLEDGE DEFICIT Goal: RH STG INCREASE KNOWLEDGE OF DIABETES Patient and caregiver will be able to verbalize s/s of hypo/hyperglycemia, dietary, and medication management with mod assist  Outcome: Completed/Met Date Met:  07/15/14

## 2014-07-15 NOTE — Plan of Care (Cosign Needed)
Problem: RH Balance Goal: LTG: Patient will maintain dynamic sitting balance (OT) LTG: Patient will maintain dynamic sitting balance with assistance during activities of daily living (OT)  Outcome: Completed/Met Date Met:  07/15/14 Goal: LTG Patient will maintain dynamic standing with ADLs (OT) LTG: Patient will maintain dynamic standing balance with assist during activities of daily living (OT)  Outcome: Completed/Met Date Met:  07/15/14  Problem: RH Grooming Goal: LTG Patient will perform grooming w/assist,cues/equip (OT) LTG: Patient will perform grooming with assist, with/without cues using equipment (OT)  Outcome: Completed/Met Date Met:  07/15/14  Problem: RH Bathing Goal: LTG Patient will bathe with assist, cues/equipment (OT) LTG: Patient will bathe specified number of body parts with assist with/without cues using equipment (position) (OT)  Outcome: Completed/Met Date Met:  07/15/14  Problem: RH Dressing Goal: LTG Patient will perform upper body dressing (OT) LTG Patient will perform upper body dressing with assist, with/without cues (OT).  Outcome: Completed/Met Date Met:  07/15/14 Goal: LTG Patient will perform lower body dressing w/assist (OT) LTG: Patient will perform lower body dressing with assist, with/without cues in positioning using equipment (OT)  Outcome: Completed/Met Date Met:  07/15/14  Problem: RH Toileting Goal: LTG Patient will perform toileting w/assist, cues/equip (OT) LTG: Patient will perform toiletiing (clothes management/hygiene) with assist, with/without cues using equipment (OT)  Outcome: Completed/Met Date Met:  07/15/14  Problem: RH Light Housekeeping Goal: LTG Patient will perform light housekeeping w/assist (OT) LTG: Patient will perform light housekeeping with assistance, with/without cues (OT).  Outcome: Not Applicable Date Met:  92/11/94  Problem: RH Toilet Transfers Goal: LTG Patient will perform toilet transfers w/assist (OT) LTG:  Patient will perform toilet transfers with assist, with/without cues using equipment (OT)  Outcome: Completed/Met Date Met:  07/15/14  Problem: RH Attention Goal: LTG Patient will demonstrate focused/sustained (OT) LTG: Patient will demonstrate focused/sustained/selective/alternating/divided attention during functional activities in specific environment with assist for # of minutes (OT)  Outcome: Completed/Met Date Met:  07/15/14  Problem: RH Awareness Goal: LTG: Patient will demonstrate intellectual/emergent (OT) LTG: Patient will demonstrate intellectual/emergent/anticipatory awareness with assist during a functional activity (OT)  Outcome: Completed/Met Date Met:  07/15/14

## 2014-07-15 NOTE — Progress Notes (Signed)
Received patient from [facility] this morning for a follow up new appointment with Dr. Sondra Come and CT/Simulation. Patient alert and oriented to person, and year. No distress noted. Denies pain. Patient denies headache, dizziness, nausea or vomiting. Denies ringing in the ears or diplopia. Left scalp incision well healed without redness, drainage or edema. No family present with patient today.

## 2014-07-15 NOTE — Progress Notes (Signed)
Occupational Therapy Discharge Summary  Patient Details  Name: [name] MRN: [id number] Date of Birth: Jan 15, 1951     Patient has met 10 of 11 long term goals due to improved activity tolerance, improved balance and improved attention.  Pt made slow but steady progress with BADLs during this admission.  Pt completes BADLs at supervision level but continues to require mod verbal cues for safety awareness.  Pt continues to require min verbal cues for task initiation and redirection to task. Pt occasionally required min encouragement to participate in therapy and occasionally would be confused regarding location. Pt to discharge to SNF. Patient to discharge at overall Supervision level requiring mod cues for safety.  Patient's brother  unable to provide the necessary 24 hour and 7days/week cognitive assistance at discharge.    Reasons goals not met: light housekeeping LTG discontinued due to change in d/c disposition to SNF  Recommendation:  Patient will benefit from ongoing skilled OT services in skilled nursing facility setting to continue to advance functional skills in the area of BADL, iADL and Reduce care partner burden.  Equipment: No equipment provided  Reasons for discharge: discharge from hospital  Patient agrees with progress made and goals achieved: Yes (yet unable to formally and accurately state this due to decreased cognition).  OT Discharge ADL  See FIM Vision/Perception  Vision- History Baseline Vision/History: No visual deficits Patient Visual Report: No change from baseline Vision- Assessment Vision Assessment?: No apparent visual deficits  Cognition Overall Cognitive Status: Impaired/Different from baseline Arousal/Alertness: Awake/alert Orientation Level: Oriented to person;Oriented to place;Oriented to situation Attention: Sustained Focused Attention: Appears intact Focused Attention Impairment: Verbal basic;Functional basic Sustained Attention:  Impaired Sustained Attention Impairment: Verbal basic;Functional basic Selective Attention: Impaired Selective Attention Impairment: Verbal basic;Functional basic Memory: Impaired Memory Impairment: Decreased short term memory;Decreased recall of new information;Decreased long term memory Decreased Short Term Memory: Verbal basic;Functional basic Awareness: Impaired Awareness Impairment: Intellectual impairment;Emergent impairment Problem Solving: Impaired Problem Solving Impairment: Verbal basic;Functional basic Initiating: Impaired Initiating Impairment: Functional basic Behaviors: Perseveration;Poor frustration tolerance Safety/Judgment: Impaired Sensation Sensation Light Touch: Appears Intact Stereognosis: Appears Intact Hot/Cold: Appears Intact Proprioception: Appears Intact Coordination Gross Motor Movements are Fluid and Coordinated: Yes Fine Motor Movements are Fluid and Coordinated: Yes    Trunk/Postural Assessment  Cervical Assessment Cervical Assessment: Within Functional Limits Thoracic Assessment Thoracic Assessment: Within Functional Limits Lumbar Assessment Lumbar Assessment: Within Functional Limits Postural Control Righting Reactions: delayed  Balance Static Sitting Balance Static Sitting - Level of Assistance: 5: Stand by assistance Dynamic Sitting Balance Dynamic Sitting - Level of Assistance: 5: Stand by assistance Extremity/Trunk Assessment RUE Assessment RUE Assessment: Within Functional Limits LUE Assessment LUE Assessment: Within Functional Limits  See FIM for current functional status  Leroy Libman 07/15/2014, 3:14 PM

## 2014-07-15 NOTE — Progress Notes (Signed)
Social Work  Discharge Note  The overall goal for the admission was met for:   Discharge location: No - plan changed to SNF as family cannot meet 24/7 care needs  Length of Stay: No - difficult bed search - LOS=17 days  Discharge activity level: Yes - supervision / min assist  Home/community participation: Yes  Services provided included: MD, RD, PT, OT, SLP, RN, TR, Pharmacy, Neuropsych and SW  Financial Services: Medicaid  Follow-up services arranged: Other: SNF @ [facility] and Rehab  Comments (or additional information):  Patient/Family verbalized understanding of follow-up arrangements: Yes  Individual responsible for coordination of the follow-up plan: brother  Confirmed correct DME delivered: NA    [name]

## 2014-07-15 NOTE — Progress Notes (Signed)
Report called to [place], Therapist, sports at [facility] and [facility]. Patient to be transported via EMS with belongings.

## 2014-07-15 NOTE — Progress Notes (Signed)
Pt alert and oriented to self. Nurse tech notified of pt temperature and hypertensive blood pressure taken around 1322. After re-assesing the patient, at 1327, I manually checked the blood pressure and heart rate which resulted at BP 92/50, HR 112 (in semi-reclined position). The patient had no complaints of pain, shortness of breath, tingling or numbness. The blood pressure was rechecked by the nurse tech and reported to nurse as 670 systolic around 1100. Another floor Nurse and the Charge nurse were notified and are aware of the pt status. Therapy also made awareness of the pt state and checked the BP which resulted at 100/68 at 1413. The patient's toileting needs were met and is continuing to be monitored. Pt resting quietly.

## 2014-07-15 NOTE — Progress Notes (Signed)
Social Work Patient ID: [name], female   DOB: [birth year], 63 y.o.   MRN: [id number]   Have received SNF bed offer today via [facility].  Planning to transfer this afternoon and pt's brother to meet her there to assist with paperwork.  Pt and family agreeable. Tx team aware.  Jarreau Callanan, LCSW

## 2014-07-15 NOTE — Telephone Encounter (Signed)
Tried to call [name] per patient request.  The number is not accepting calls.  Also tried [phone] per patient and it is not accepting messages.

## 2014-07-15 NOTE — Progress Notes (Signed)
Speech Language Pathology Daily Session Note  Patient Details  Name: [name] MRN: [id number] Date of Birth: [birth year]  Today's Date: 07/15/2014 SLP Individual Time: 1400-1435 SLP Individual Time Calculation (min): 35 min  Short Term Goals: Week 3: SLP Short Term Goal 1 (Week 3): Patient will consume least restrictive diet with Min A multimodal cues for utilization of swallowing compensatory strategies to minimize overt s/s of aspiration. SLP Short Term Goal 2 (Week 3): Pt will increase overall speech intelligibility by increased vocal intensity and over-articulation at the phrase level with Min A multimodal cues.  SLP Short Term Goal 3 (Week 3): Pt will sustain attention to basic functional tasks for 10 minutes with Min A multimodal cues.  SLP Short Term Goal 4 (Week 3): Pt will demonstrate functional problem solving for basic and familiar tasks with Min A multimodal cues.  SLP Short Term Goal 5 (Week 3): Pt will utilize external visual aids to recall new, daily information with Min A multimodal cues.  SLP Short Term Goal 6 (Week 3): Pt will use call bell to request assistance with Max A multimodal cues.   Skilled Therapeutic Interventions: Skilled treatment session focused on cognitive-linguistic goals. Upon arrival, patient was supine in bed and required Mod multimodal cues for encouragement to participate. SLP facilitated session by providing extra time and Mod A for patient to sit EOB and to transfer to the wheelchair. Patient independently requested to use the bathroom and required Max  A multimodal cues for safety and problem solving during self-care tasks. Patient demonstrated decreased verbal expression with increased difficulty with word-finding and intermittent language of confusion and patient required total A to self-monitor and correct errors. Patient handed off to PT. Patient's plan is to discharge to SNF today.    FIM:  Comprehension Comprehension Mode:  Auditory Comprehension: 3-Understands basic 50 - 74% of the time/requires cueing 25 - 50%  of the time Expression Expression Mode: Verbal Expression: 2-Expresses basic 25 - 49% of the time/requires cueing 50 - 75% of the time. Uses single words/gestures. Social Interaction Social Interaction: 2-Interacts appropriately 25 - 49% of time - Needs frequent redirection. Problem Solving Problem Solving: 2-Solves basic 25 - 49% of the time - needs direction more than half the time to initiate, plan or complete simple activities Memory Memory: 2-Recognizes or recalls 25 - 49% of the time/requires cueing 51 - 75% of the time  Pain Pain Assessment Pain Assessment: No/denies pain  Therapy/Group: Individual Therapy  Thaison Kolodziejski 07/15/2014, 4:39 PM

## 2014-07-15 NOTE — Progress Notes (Signed)
Occupational Therapy Session Note  Patient Details  Name: [name] MRN: [id number] Date of Birth: Feb 04, 1951  Today's Date: 07/15/2014 OT Individual Time:  -       Short Term Goals: Week 2:  OT Short Term Goal 1 (Week 2): Pt will complete sponge bath with supervision and min verbal cues OT Short Term Goal 2 (Week 2): Pt will complete dressing tasks with supervision and min verbal cues OT Short Term Goal 3 (Week 2): Pt will complete toileting tasks with supervision and min verbal cues  Skilled Therapeutic Interventions/Progress Updates:    Pt missed 60 mins skilled OT services.  Pt stated she was worn out from morning trip for preliminary radiation and couldn't do anything.  Pt's BP as measured NT on dynamap at 168/46.  RN notified and BP recorded manually.  See vitals below.  Therapy Documentation Precautions:  Precautions Precautions: Fall Precaution Comments: due to instability in standing.  Restrictions Weight Bearing Restrictions: No General: General OT Amount of Missed Time: 60 Minutes Vital Signs: Therapy Vitals Pulse Rate: (!) 112 BP: (!) 92/50 mmHg (recheck, heard whooshing sound at 309 systolic) Patient Position (if appropriate): Sitting Pain: Pain Assessment Pain Assessment: No/denies pain  Leroy Libman 07/15/2014, 1:34 PM

## 2014-07-15 NOTE — Progress Notes (Signed)
Physical Therapy Session Note  Patient Details  Name: [name] MRN: [id number] Date of Birth: [birth year]  Today's Date: 07/15/2014 PT Individual Time: 1435-1450 PT Individual Time Calculation (min): 15 min   Short Term Goals: Week 3:  PT Short Term Goal 1 (Week 3): STGs=LTGs due to anticipated LOS  Skilled Therapeutic Interventions/Progress Updates:  1:1. Pt received sitting in w/c, PT taking over tx session from SLP. Attempted focus this session on reassessment of pt's LTGs. Overall, pt demonstrating decreased verbal expression and decreased sustained attention, req max multimodal cues for basic completion of familiar functional tasks this session. Pt req min A for SPT w/c<>tx mat and min-mod A for t/f sup<>sit on tx mat. After sitting up, with increased prompting pt finally relaying that she dizzy and not feeling well in general. Vitals assessed, see details below. Pt requesting to return back to room to lie down. Pt transported back to room via w/c and assisted back to bed, all needs in reach and bed alarm on. RN aware and present at end of session. Pt scheduled to d/c to SNF today.   Therapy Documentation Precautions:  Precautions Precautions: Fall Precaution Comments: due to instability in standing.  Restrictions Weight Bearing Restrictions: No Vital Signs: Therapy Vitals Pulse Rate: (!) 120 BP: 98/62 mmHg Patient Position (if appropriate): Sitting Pain: Pain Assessment Pain Assessment: No/denies pain  See FIM for current functional status  Therapy/Group: Individual Therapy  [name] 07/15/2014, 5:24 PM

## 2014-07-15 NOTE — Progress Notes (Signed)
[facility]ILIT[facility]     PROGRESS NOTE    Subjective/Complaints: No complaints. Denies pain.  Objective: Vital Signs: Blood pressure 100/68, pulse 112, temperature 99.1 F (37.3 C), temperature source Oral, resp. rate 17, height 5\' 1"  (1.549 m), weight 56 kg (123 lb 7.3 oz), SpO2 98 %. No results found. No results for input(s): WBC, HGB, HCT, PLT in the last 72 hours. No results for input(s): NA, K, CL, GLUCOSE, BUN, CREATININE, CALCIUM in the last 72 hours.  Invalid input(s): CO CBG (last 3)   Recent Labs  07/14/14 2040 07/15/14 0626 07/15/14 1112  GLUCAP 112* 90 143*    Wt Readings from Last 3 Encounters:  07/15/14 56 kg (123 lb 7.3 oz)  07/15/14 55.792 kg (123 lb)  06/23/14 59.1 kg (130 lb 4.7 oz)    Physical Exam:  General: no distress. Head shaven on left  Eyes: EOM are normal  HEENT: edentulous, oral mucosa pink/moist.  Patient is alert and makes good eye contact with examiner. Better processing. Sometimes still needs cueing.  Visual acuity intact.  Skin: skin intact, no breakdown no drainage   Assessment/Plan: 1. Functional deficits secondary to metastatic lung cancer to the brain which require 3+ hours per day of interdisciplinary therapy in a comprehensive inpatient rehab setting. Physiatrist is providing close team supervision and 24 hour management of active medical problems listed below. Physiatrist and rehab team continue to assess barriers to discharge/monitor patient progress toward functional and medical goals.  To SNF  FIM: FIM - Bathing Bathing Steps Patient Completed: Chest, Right Arm, Left Arm, Abdomen, Right upper leg, Left upper leg, Front perineal area, Buttocks, Right lower leg (including foot), Left lower leg (including foot) Bathing:  (declined bath today)  FIM - Upper Body Dressing/Undressing Upper body dressing/undressing steps patient completed: Thread/unthread right sleeve of pullover shirt/dresss,  Thread/unthread left sleeve of pullover shirt/dress, Put head through opening of pull over shirt/dress, Pull shirt over trunk Upper body dressing/undressing: 5: Set-up assist to: Obtain clothing/put away FIM - Lower Body Dressing/Undressing Lower body dressing/undressing steps patient completed: Thread/unthread right underwear leg, Thread/unthread left underwear leg, Pull underwear up/down, Don/Doff right sock, Don/Doff left sock, Thread/unthread right pants leg, Thread/unthread left pants leg, Pull pants up/down, Don/Doff right shoe, Don/Doff left shoe Lower body dressing/undressing: 5: Supervision: Safety issues/verbal cues  FIM - Toileting Toileting steps completed by patient: Performs perineal hygiene, Adjust clothing prior to toileting, Adjust clothing after toileting Toileting Assistive Devices: Grab bar or rail for support Toileting: 5: Supervision: Safety issues/verbal cues  FIM - Radio producer Devices: Grab bars Toilet Transfers: 5-To toilet/BSC: Supervision (verbal cues/safety issues), 5-From toilet/BSC: Supervision (verbal cues/safety issues)  FIM - Control and instrumentation engineer Devices: Arm rests Bed/Chair Transfer: 5: Bed > Chair or W/C: Supervision (verbal cues/safety issues), 5: Chair or W/C > Bed: Supervision (verbal cues/safety issues), 5: Sit > Supine: Supervision (verbal cues/safety issues)  FIM - Locomotion: Wheelchair Distance: 25' Locomotion: Wheelchair: 1: Travels less than 50 ft with supervision, cueing or coaxing FIM - Locomotion: Ambulation Locomotion: Ambulation Assistive Devices: Other (comment) (no AD; L HHA) Ambulation/Gait Assistance: 4: Min assist Locomotion: Ambulation: 2: Travels 50 - 149 ft with minimal assistance (Pt.>75%)  Comprehension Comprehension Mode: Auditory Comprehension: 3-Understands basic 50 - 74% of the time/requires cueing 25 - 50%  of the time  Expression Expression Mode:  Verbal Expression: 3-Expresses basic 50 - 74% of the time/requires cueing 25 - 50% of the time. Needs to repeat parts  of sentences.  Social Interaction Social Interaction Mode: Asleep Social Interaction: 2-Interacts appropriately 25 - 49% of time - Needs frequent redirection.  Problem Solving Problem Solving Mode: Asleep Problem Solving: 2-Solves basic 25 - 49% of the time - needs direction more than half the time to initiate, plan or complete simple activities  Memory Memory Mode: Asleep Memory: 3-Recognizes or recalls 50 - 74% of the time/requires cueing 25 - 49% of the time  Medical Problem List and Plan:  1. Functional deficits secondary to left frontal and occipital brain tumors (metastatic lung) status post resection 06/23/2014 2. DVT Prophylaxis/Anticoagulation: SCDs. Monitor for any signs of DVT  3. Pain Management: Hydrocodone as needed. Monitor with increased mobility  4. Mood/bipolar disorder: Risperdal 4 mg each bedtime. Mood stable at present 5. Neuropsych: This patient is not capable of making decisions on her own behalf.  6. Skin/Wound Care: Routine skin checks monitor craniotomy incision  7. Fluids/Electrolytes/Nutrition:   Provide nutritional supplements as needed    -labwork and elecrolytes within normal range  8. Seizure prophylaxis. Keppra 500 mg twice a day  9. Diabetes mellitus. Glucophage 1000 mg twice a day at home (on now meds now)  -sugars remain   under control    -decadron off LOS (Days) 17 A FACE TO FACE EVALUATION WAS PERFORMED  [name] T 07/15/2014 3:31 PM
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
## 2014-07-15 NOTE — Progress Notes (Signed)
  Radiation Oncology         ([phone]) [phone] ________________________________  Name: BRITANIE HARSHMAN MRN: [id number]  Date: 07/15/2014  DOB: 03-Oct-1950  SIMULATION AND TREATMENT PLANNING NOTE - IN patient    ICD-9-CM ICD-10-CM   1. 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  Identity was confirmed.  All relevant records and images related to the planned course of therapy were reviewed.  The patient freely provided informed written consent to proceed with treatment after reviewing the details related to the planned course of therapy. The consent form was witnessed and verified by the simulation staff.  Then, the patient was set-up in a stable reproducible  supine position for radiation therapy.  CT images were obtained.  Surface markings were placed.  The CT images were loaded into the planning software.  Then the target and avoidance structures were contoured.  Treatment planning then occurred.  The radiation prescription was entered and confirmed.  Then, I designed and supervised the construction of a total of 3 medically necessary complex treatment devices.  I have requested : Isodose Plan.  I have ordered:dose calc.  PLAN:  The patient will receive 20 Gy in 10 fractions.  ________________________________  -----------------------------------  Blair Promise, PhD, MD

## 2014-07-16 NOTE — Plan of Care (Signed)
Problem: RH Memory Goal: LTG Patient will use memory compensatory aids to (SLP) LTG: Patient will use memory compensatory aids to recall biographical/new, daily complex information with cues (SLP)  Outcome: Completed/Met Date Met:  07/16/14

## 2014-07-16 NOTE — Plan of Care (Signed)
Problem: RH Problem Solving Goal: LTG Patient will demonstrate problem solving for (SLP) LTG: Patient will demonstrate problem solving for basic/complex daily situations with cues (SLP)  Outcome: Completed/Met Date Met:  07/16/14

## 2014-07-16 NOTE — Plan of Care (Signed)
Problem: RH Stairs Goal: LTG Patient will ambulate up and down stairs w/assist (PT) LTG: Patient will ambulate up and down # of stairs with assistance (PT)  Outcome: Not Met (add Reason) Pt continues to require min guard A for safety.

## 2014-07-16 NOTE — Plan of Care (Signed)
Problem: RH Swallowing Goal: LTG Patient will consume least restrictive PO diet (SLP) LTG: Patient will consume least restrictive PO diet with assist for use of compensatory strategies (SLP)  Outcome: Completed/Met Date Met:  07/16/14

## 2014-07-16 NOTE — Progress Notes (Signed)
Physical Therapy Discharge Summary  Patient Details  Name: [name] MRN: [id number] Date of Birth: 1951-03-24   Patient has met 11 out of 13 long term goals due to improved activity tolerance, improved balance, improved postural control, increased strength, ability to compensate for deficits, functional use of right upper extremity and right lower extremity, improved attention and improved awareness.  Patient to discharge at an ambulatory level supervision-min A with use of RW.  Patient's family is unable to provide the necessary physical and cognitive assistance at discharge. Pt therefore to d/c to SNF.   Reasons goals not met: Pt continues to req min A for safe completion of floor transfers and min guard A for stair negotiation.   Recommendation:  Patient will benefit from ongoing skilled PT services in skilled nursing facility setting to continue to advance safe functional mobility, address ongoing impairments in decreased attention, decreased awareness, decreased memory, decreased problem solving, decreased strength, decreased balance, decreased overall safety during functional transfers and mobility, and minimize fall risk.  Equipment: No equipment provided  Reasons for discharge: treatment goals met and discharge from hospital  Patient/family agrees with progress made and goals achieved: Yes  PT Discharge Precautions/Restrictions Precautions Precautions: Fall Restrictions Weight Bearing Restrictions: No   Vision/Perception  See OT discharge Cognition Overall Cognitive Status: Impaired/Different from baseline Arousal/Alertness: Awake/alert Orientation Level: Oriented X4 Attention: Sustained Focused Attention: Appears intact Sustained Attention: Impaired Sustained Attention Impairment: Verbal basic;Functional basic Selective Attention: Impaired Selective Attention Impairment: Verbal basic;Functional basic Memory: Impaired Memory Impairment: Decreased short term  memory;Decreased recall of new information;Decreased long term memory Decreased Long Term Memory: Verbal basic;Functional basic Decreased Short Term Memory: Verbal basic;Functional basic Awareness: Impaired Awareness Impairment: Intellectual impairment Problem Solving: Impaired Problem Solving Impairment: Verbal basic;Functional basic Executive Function:  (all impaired ) Behaviors: Perseveration;Poor frustration tolerance Safety/Judgment: Impaired Sensation Sensation Light Touch: Appears Intact Proprioception: Appears Intact Coordination Gross Motor Movements are Fluid and Coordinated: Yes Fine Motor Movements are Fluid and Coordinated: Yes Motor  Motor Motor: Within Functional Limits  Mobility Bed Mobility Bed Mobility: Supine to Sit;Sit to Supine Supine to Sit: 5: Supervision Supine to Sit Details: Verbal cues for precautions/safety Transfers Transfers: Yes Sit to Stand: 5: Supervision Sit to Stand Details: Verbal cues for precautions/safety;Verbal cues for safe use of DME/AE Stand to Sit: 5: Supervision Stand to Sit Details (indicate cue type and reason): Verbal cues for precautions/safety;Verbal cues for safe use of DME/AE Stand Pivot Transfers: 5: Supervision Stand Pivot Transfer Details: Verbal cues for precautions/safety;Verbal cues for safe use of DME/AE Stand Pivot Transfer Details (indicate cue type and reason): with RW Locomotion  Ambulation Ambulation: Yes Ambulation/Gait Assistance: 5: Supervision Ambulation Distance (Feet): 200 Feet Assistive device: Rolling walker Ambulation/Gait Assistance Details: Verbal cues for safe use of DME/AE;Verbal cues for precautions/safety Gait Gait: Yes Gait Pattern: Within Functional Limits Gait velocity: decreased Stairs / Additional Locomotion Stairs: Yes Stairs Assistance: 4: Min guard Stairs Assistance Details: Verbal cues for precautions/safety Stair Management Technique: One rail Right;Step to  pattern;Forwards Number of Stairs: 6 Wheelchair Mobility Wheelchair Mobility: No (Pt at ambulatory level)  Trunk/Postural Assessment  Cervical Assessment Cervical Assessment: Within Functional Limits Thoracic Assessment Thoracic Assessment: Within Functional Limits Lumbar Assessment Lumbar Assessment: Within Functional Limits Postural Control Postural Control: Deficits on evaluation Righting Reactions: delayed in standing  Balance Balance Balance Assessed: Yes Static Sitting Balance Static Sitting - Balance Support: Feet supported;No upper extremity supported Static Sitting - Level of Assistance: 5: Stand by assistance Dynamic Sitting Balance Dynamic Sitting -  Balance Support: Right upper extremity supported;Left upper extremity supported;Feet supported Dynamic Sitting - Level of Assistance: 5: Stand by assistance Dynamic Sitting - Balance Activities: Lateral lean/weight shifting;Forward lean/weight shifting;Reaching for objects Static Standing Balance Static Standing - Balance Support: No upper extremity supported;Left upper extremity supported;Right upper extremity supported Static Standing - Level of Assistance: 5: Stand by assistance;4: Min assist Dynamic Standing Balance Dynamic Standing - Balance Support: Left upper extremity supported;Right upper extremity supported Dynamic Standing - Level of Assistance: 5: Stand by assistance;4: Min assist Dynamic Standing - Comments: (S) with RW and min A with HHA Extremity Assessment  RUE Assessment RUE Assessment: Within Functional Limits LUE Assessment LUE Assessment: Within Functional Limits RLE Assessment RLE Assessment: Exceptions to [facility] RLE Strength RLE Overall Strength Comments: Grossly 4-/5 LLE Assessment LLE Assessment: Within Functional Limits LLE Strength LLE Overall Strength Comments: Grossly 4+/5  See FIM for current functional status  [name] 07/16/2014, 10:01 AM

## 2014-07-16 NOTE — Plan of Care (Signed)
Problem: RH Attention Goal: LTG Patient will demonstrate focused/sustained (SLP) LTG: Patient will demonstrate focused/sustained/selective/alternating/divided attention during cognitive/linguistic activities in specific environment with assist for # of minutes (SLP)  Outcome: Completed/Met Date Met:  07/16/14

## 2014-07-16 NOTE — Plan of Care (Signed)
Problem: RH Balance Goal: LTG Patient will maintain dynamic standing balance (PT) LTG: Patient will maintain dynamic standing balance with assistance during mobility activities (PT)  Outcome: Completed/Met Date Met:  07/16/14  Problem: RH Furniture Transfers Goal: LTG Patient will perform furniture transfers w/assist (OT/PT LTG: Patient will perform furniture transfers with assistance (OT/PT).  Outcome: Completed/Met Date Met:  07/16/14  Problem: RH Floor Transfers Goal: LTG Patient will perform floor transfers w/assist (PT) LTG: Patient will perform floor transfers with assistance (PT).  Outcome: Not Met (add Reason) Pt req min A for safe completion of floor transfer  Problem: RH Ambulation Goal: LTG Patient will ambulate in controlled environment (PT) LTG: Patient will ambulate in a controlled environment, # of feet with assistance (PT).  Outcome: Completed/Met Date Met:  07/16/14 Goal: LTG Patient will ambulate in home environment (PT) LTG: Patient will ambulate in home environment, # of feet with assistance (PT).  Outcome: Completed/Met Date Met:  07/16/14  Problem: RH Stairs Goal: LTG Patient will ambulate up and down stairs w/assist (PT) LTG: Patient will ambulate up and down # of stairs with assistance (PT)  Outcome: Completed/Met Date Met:  07/16/14  Problem: RH Memory Goal: LTG Patient demonstrate ability for day to day recall (PT) LTG: Patient will demonstrate ability for day to day recall/carryover during mobility activities with assist (PT)  Outcome: Completed/Met Date Met:  07/16/14  Problem: RH Attention Goal: LTG Patient will demonstrate focused/sustained (PT) LTG: Patient will demonstrate focused/sustained/selective/alternating/divided attention during functional mobility in specific environment with assist for # of minutes (PT)  Outcome: Completed/Met Date Met:  07/16/14  Problem: RH Awareness Goal: LTG: Patient will demonstrate intellectual/emergent  (PT) LTG: Patient will demonstrate intellectual/emergent/anticipatory awareness with assist during a mobility activity (PT)  Outcome: Completed/Met Date Met:  07/16/14

## 2014-07-16 NOTE — Plan of Care (Signed)
Problem: RH Expression Communication Goal: LTG Patient will increase speech intelligibility (SLP) LTG: Patient will increase speech intelligibility at word/phrase/conversation level with cues, % of the time (SLP)  Outcome: Completed/Met Date Met:  07/16/14

## 2014-07-16 NOTE — Progress Notes (Signed)
Speech Language Pathology Discharge Summary  Patient Details  Name: [name] MRN: [id number] Date of Birth: June 27, 1951   Patient has met 5 of 5 long term goals.  Patient to discharge at overall Mod level.   Reasons goals not met: N/A   Clinical Impression/Discharge Summary: Patient has made functional gains and has met 6 of 6 LTG's this admission due to increased attention, problem solving, orientation, memory, speech intelligibility and swallowing function.  Currently, patient is consuming regular textures with thin liquids without overt s/s of aspiration and requires Mod A multimodal cues for utilization of small bites/sips.  Patient also requires overall Min A multimodal cues for orientation and Mod A multimodal cues for sustained attention to functional tasks, initiation and problem solving with basic and familiar tasks. Patient can verbally express her wants/needs with 100% intelligibility at the word and phrase level but continues to demonstrate intermittent verbal errors with structured tasks with minimal ability to self-monitor and correct errors. Patient's family is unable to provide the necessary cognitive and physical assistance required at this time, therefore, patient will discharge to SNF. Patient would benefit from continued skilled SLP intervention at next level of care to maximize functional communication, swallowing function and her cognitive-linguistic function in order to maximize her overall functional independence.  Care Partner:  Caregiver Able to Provide Assistance: No  Type of Caregiver Assistance: Physical;Cognitive  Recommendation:  Skilled Nursing facility;24 hour supervision/assistance  Rationale for SLP Follow Up: Maximize functional communication;Maximize cognitive function and independence;Reduce caregiver burden;Maximize swallowing safety   Equipment: N/A   Reasons for discharge: Treatment goals met;Discharged from hospital   Patient/Family Agrees with  Progress Made and Goals Achieved: Yes   See FIM for current functional status  Jodean Valade 07/16/2014, 7:32 AM

## 2014-07-18 ENCOUNTER — Telehealth: Payer: Self-pay | Admitting: Oncology

## 2014-07-18 ENCOUNTER — Ambulatory Visit
Admit: 2014-07-18 | Discharge: 2014-07-18 | Disposition: A | Discharge status: Home / Self Care | Payer: Medicaid Other | Attending: Radiation Oncology | Admitting: Radiation Oncology

## 2014-07-18 DIAGNOSIS — F319 Bipolar disorder, unspecified: Secondary | ICD-10-CM | POA: Diagnosis not present

## 2014-07-18 DIAGNOSIS — Z510 Encounter for antineoplastic radiation therapy: Secondary | ICD-10-CM | POA: Diagnosis present

## 2014-07-18 DIAGNOSIS — D496 Neoplasm of unspecified behavior of brain: Secondary | ICD-10-CM | POA: Diagnosis not present

## 2014-07-18 DIAGNOSIS — C3490 Malignant neoplasm of unspecified part of unspecified bronchus or lung: Secondary | ICD-10-CM | POA: Diagnosis not present

## 2014-07-18 DIAGNOSIS — Z794 Long term (current) use of insulin: Secondary | ICD-10-CM | POA: Diagnosis not present

## 2014-07-18 DIAGNOSIS — Z923 Personal history of irradiation: Secondary | ICD-10-CM | POA: Diagnosis not present

## 2014-07-18 DIAGNOSIS — Z79899 Other long term (current) drug therapy: Secondary | ICD-10-CM | POA: Diagnosis not present

## 2014-07-18 DIAGNOSIS — E119 Type 2 diabetes mellitus without complications: Secondary | ICD-10-CM | POA: Diagnosis not present

## 2014-07-18 DIAGNOSIS — F17210 Nicotine dependence, cigarettes, uncomplicated: Secondary | ICD-10-CM | POA: Diagnosis not present

## 2014-07-18 NOTE — Telephone Encounter (Signed)
Called [facility] regarding Laurencia's radiation treatments to her whole brain.  Doren Custard said "he knew she was going to treatment today and I am OK with her having it."  He also told this to Gaspar Garbe, RN, over the phone.

## 2014-07-18 NOTE — Telephone Encounter (Signed)
Called [facility] to make sure transportation has been arranged for Radiation Treatment today.  They said that transportation has been arranged to have her arrive for her treatment at 12:30.

## 2014-07-19 ENCOUNTER — Ambulatory Visit
Admit: 2014-07-19 | Discharge: 2014-07-19 | Disposition: A | Discharge status: Home / Self Care | Payer: Medicaid Other | Attending: Radiation Oncology | Admitting: Radiation Oncology

## 2014-07-19 DIAGNOSIS — Z510 Encounter for antineoplastic radiation therapy: Secondary | ICD-10-CM | POA: Diagnosis not present

## 2014-07-20 ENCOUNTER — Ambulatory Visit
Admit: 2014-07-20 | Discharge: 2014-07-20 | Disposition: A | Discharge status: Home / Self Care | Payer: Medicaid Other | Attending: Radiation Oncology | Admitting: Radiation Oncology

## 2014-07-20 ENCOUNTER — Encounter: Payer: Self-pay | Admitting: Radiation Oncology

## 2014-07-20 ENCOUNTER — Ambulatory Visit
Admission: RE | Admit: 2014-07-20 | Discharge: 2014-07-20 | Disposition: A | Discharge status: Home / Self Care | Payer: Medicaid Other | Source: Ambulatory Visit | Attending: Radiation Oncology | Admitting: Radiation Oncology

## 2014-07-20 ENCOUNTER — Telehealth: Payer: Self-pay | Admitting: Oncology

## 2014-07-20 VITALS — BP 100/68 | HR 93 | Temp 99.0°F | Resp 16 | Ht 61.0 in | Wt 116.8 lb

## 2014-07-20 DIAGNOSIS — C3490 Malignant neoplasm of unspecified part of unspecified bronchus or lung: Secondary | ICD-10-CM | POA: Insufficient documentation

## 2014-07-20 DIAGNOSIS — C7931 Secondary malignant neoplasm of brain: Secondary | ICD-10-CM | POA: Diagnosis not present

## 2014-07-20 DIAGNOSIS — D496 Neoplasm of unspecified behavior of brain: Secondary | ICD-10-CM

## 2014-07-20 DIAGNOSIS — Z510 Encounter for antineoplastic radiation therapy: Secondary | ICD-10-CM | POA: Insufficient documentation

## 2014-07-20 MED ORDER — BIAFINE EX EMUL
Freq: Once | CUTANEOUS | Status: AC
  Administered 2014-07-20: 18:00:00 via TOPICAL

## 2014-07-20 NOTE — Progress Notes (Signed)
  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 . . . . . . . The patient presents for routine under treatment assessment.                                   The patient is without complaint. She denies any nausea or headaches or scalp irritation                                 Set-up films were reviewed.                                 The chart was checked. Physical Findings. . .  height is 5\' 1"  (1.549 m) and weight is 116 lb 12.8 oz (52.98 kg). Her oral temperature is 99 F (37.2 C). Her blood pressure is 100/68 and her pulse is 93. Her respiration is 16 and oxygen saturation is 99%. . The patient seems more alert on exam today.  The scalp shows no significant irritation. The oral cavity is free of secondary infection and moist. Impression . . . . . . . The patient is tolerating radiation. Plan . . . . . . . . . . . . Continue treatment as planned.  ________________________________   Blair Promise, PhD, MD

## 2014-07-20 NOTE — Progress Notes (Signed)
[name] has completed 3 fractions to her brain.  She denies having pain, headache, nausea, dizziness and blurred vision.  She reports a good appetite.  She is not taking decadron but is taking keppra.    She has been given the Radiation Therapy and You book and discussed potential side effects including skin irritation, nausea, headaches, hair loss and fatigue.  She has been given biafine cream and has been instructed to apply it to the treatment area twice a day as needed.
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
## 2014-07-20 NOTE — Addendum Note (Signed)
Encounter addended by: Jacqulyn Liner, RN on: 07/20/2014  5:31 PM<BR>     Documentation filed: Inpatient MAR

## 2014-07-20 NOTE — Addendum Note (Signed)
Encounter addended by: Jacqulyn Liner, RN on: 07/20/2014  5:24 PM<BR>     Documentation filed: Medications, Dx Association, Orders

## 2014-07-21 ENCOUNTER — Ambulatory Visit
Admit: 2014-07-21 | Discharge: 2014-07-21 | Disposition: A | Discharge status: Home / Self Care | Payer: Medicaid Other | Attending: Radiation Oncology | Admitting: Radiation Oncology

## 2014-07-21 DIAGNOSIS — Z510 Encounter for antineoplastic radiation therapy: Secondary | ICD-10-CM | POA: Diagnosis not present

## 2014-07-22 ENCOUNTER — Telehealth: Payer: Self-pay | Admitting: Oncology

## 2014-07-22 ENCOUNTER — Ambulatory Visit: Payer: Medicaid Other

## 2014-07-22 NOTE — Telephone Encounter (Signed)
Notified Sabrina at [facility] that [name] does not need to come for radiation treatment today due to the machine being down.  Advised her that Adriana will resume her treatment on Monday.  Sabrina verbalized understanding.

## 2014-07-25 ENCOUNTER — Ambulatory Visit: Payer: Medicaid Other

## 2014-07-25 ENCOUNTER — Telehealth: Payer: Self-pay | Admitting: Oncology

## 2014-07-25 NOTE — Telephone Encounter (Signed)
Notified Sabrina at [facility] that the machine is down so [name] treatment is canceled today.  Sabrina verbalized agreement.

## 2014-07-26 ENCOUNTER — Ambulatory Visit
Admission: RE | Admit: 2014-07-26 | Discharge: 2014-07-26 | Disposition: A | Discharge status: Home / Self Care | Payer: Medicaid Other | Source: Ambulatory Visit | Attending: Radiation Oncology | Admitting: Radiation Oncology

## 2014-07-26 ENCOUNTER — Encounter: Payer: Self-pay | Admitting: Radiation Oncology

## 2014-07-26 VITALS — BP 113/80 | HR 92 | Temp 97.6°F | Resp 20 | Ht 61.0 in

## 2014-07-26 DIAGNOSIS — D496 Neoplasm of unspecified behavior of brain: Secondary | ICD-10-CM

## 2014-07-26 DIAGNOSIS — Z510 Encounter for antineoplastic radiation therapy: Secondary | ICD-10-CM | POA: Diagnosis not present

## 2014-07-26 NOTE — Progress Notes (Signed)
[name] has completed 5 fractions to her brain.  [name] denies pain, headaches, dizziness, blurred vision, nausea and fatigue.  The skin on her forehead and scalp is intact.  [name] continues to take Keppra.

## 2014-07-26 NOTE — Progress Notes (Signed)
  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 . . . . . . . The patient presents for routine under treatment assessment.                                   The patient is without complaint. She denies any nausea or headaches                                 Set-up films were reviewed.                                 The chart was checked. Physical Findings. . .  height is 5\' 1"  (1.549 m). Her oral temperature is 97.6 F (36.4 C). Her blood pressure is 113/80 and her pulse is 92. Her respiration is 20 and oxygen saturation is 100%. . Weight essentially stable.  No significant changes. No significant scalp reaction. The oral cavity is moist without secondary infection. Patient seems to be more alert today Impression . . . . . . . The patient is tolerating radiation. Plan . . . . . . . . . . . . Continue treatment as planned.  ________________________________   Blair Promise, PhD, MD

## 2014-07-27 ENCOUNTER — Ambulatory Visit
Admission: RE | Admit: 2014-07-27 | Discharge: 2014-07-27 | Disposition: A | Discharge status: Home / Self Care | Payer: Medicaid Other | Source: Ambulatory Visit | Attending: Radiation Oncology | Admitting: Radiation Oncology

## 2014-07-27 DIAGNOSIS — Z510 Encounter for antineoplastic radiation therapy: Secondary | ICD-10-CM | POA: Diagnosis not present

## 2014-07-28 ENCOUNTER — Ambulatory Visit
Admission: RE | Admit: 2014-07-28 | Discharge: 2014-07-28 | Disposition: A | Discharge status: Home / Self Care | Payer: Medicaid Other | Source: Ambulatory Visit | Attending: Radiation Oncology | Admitting: Radiation Oncology

## 2014-07-28 DIAGNOSIS — Z510 Encounter for antineoplastic radiation therapy: Secondary | ICD-10-CM | POA: Diagnosis not present

## 2014-07-29 ENCOUNTER — Ambulatory Visit: Payer: Medicaid Other

## 2014-07-29 ENCOUNTER — Ambulatory Visit
Admission: RE | Admit: 2014-07-29 | Discharge: 2014-07-29 | Disposition: A | Discharge status: Home / Self Care | Payer: Medicaid Other | Source: Ambulatory Visit | Attending: Radiation Oncology | Admitting: Radiation Oncology

## 2014-07-29 ENCOUNTER — Telehealth: Payer: Self-pay | Admitting: Oncology

## 2014-07-29 DIAGNOSIS — Z510 Encounter for antineoplastic radiation therapy: Secondary | ICD-10-CM | POA: Diagnosis not present

## 2014-07-29 NOTE — Telephone Encounter (Signed)
Called [name] with [facility] to verify appointment times for next week.  Gabriel Cirri does have the schedule and will have Ms Kissinger here for her radiation treatments.

## 2014-08-01 ENCOUNTER — Ambulatory Visit: Payer: Medicaid Other

## 2014-08-01 ENCOUNTER — Ambulatory Visit
Admission: RE | Admit: 2014-08-01 | Discharge: 2014-08-01 | Disposition: A | Discharge status: Home / Self Care | Payer: Medicaid Other | Source: Ambulatory Visit | Attending: Radiation Oncology | Admitting: Radiation Oncology

## 2014-08-01 DIAGNOSIS — Z510 Encounter for antineoplastic radiation therapy: Secondary | ICD-10-CM | POA: Diagnosis not present

## 2014-08-02 ENCOUNTER — Ambulatory Visit
Admission: RE | Admit: 2014-08-02 | Discharge: 2014-08-02 | Disposition: A | Discharge status: Home / Self Care | Payer: Medicaid Other | Source: Ambulatory Visit | Attending: Radiation Oncology | Admitting: Radiation Oncology

## 2014-08-02 ENCOUNTER — Telehealth: Payer: Self-pay | Admitting: *Deleted

## 2014-08-02 ENCOUNTER — Ambulatory Visit: Payer: Medicaid Other

## 2014-08-02 ENCOUNTER — Encounter: Payer: Self-pay | Admitting: Radiation Oncology

## 2014-08-02 VITALS — BP 134/71 | HR 94 | Temp 97.8°F | Resp 20 | Ht 61.0 in | Wt 125.2 lb

## 2014-08-02 DIAGNOSIS — D496 Neoplasm of unspecified behavior of brain: Secondary | ICD-10-CM

## 2014-08-02 DIAGNOSIS — Z510 Encounter for antineoplastic radiation therapy: Secondary | ICD-10-CM | POA: Diagnosis not present

## 2014-08-02 NOTE — Progress Notes (Signed)
Weekly Management Note Current Dose:  20 Gy  Projected Dose: 20 Gy   Narrative:  The patient presents for routine under treatment assessment.  CBCT/MVCT images/Port film x-rays were reviewed.  The chart was checked. No headaches or seizures. Balance is improved. Uncertain of follow up with Dr. Marin Olp. Not on decadron.   Physical Findings: Weight: 125 lb 3.2 oz (56.79 kg).. Alert and oriented. Dry desquamation of forehead.   Impression:  The patient is tolerating radiation.  Plan:  Continue treatment as planned. Continue biafene. Follow up in 1 month. Will give info on Dr. Antonieta Pert visit.

## 2014-08-02 NOTE — Telephone Encounter (Signed)
[place] TO INFORM OF APPT. WITH DR. Marin Olp ON 08/15/14 @ 1:45 PM, SPOKE WITH NURSE AND SHE IS AWARE OF THIS APPT.

## 2014-08-02 NOTE — Progress Notes (Signed)
[name] has completed treatment to her brain with 10 fractions.  She denies pain, nausea and vision changes.  She is not taking decadron.  She continues to take Keppra.  She is oriented to person, place and time.  She reports her balance is getting better and she is having physical therapy at [facility].  The skin on her forehead is dry and peeling.  She is using biafine twice a day.

## 2014-08-08 ENCOUNTER — Encounter: Payer: Self-pay | Admitting: Radiation Oncology

## 2014-08-08 NOTE — Progress Notes (Signed)
  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   She did not require steroid supplementation. She denie any nausea or headaches during the course for treatment. Her confusion continue to improve.  Plan: The patient has completed radiation treatment. The patient will return to radiation oncology clinic for routine followup in one month. I advised them to call or return sooner if they have any questions or concerns related to their recovery or treatment.  -----------------------------------  Blair Promise, PhD, MD

## 2014-08-12 ENCOUNTER — Telehealth: Payer: Self-pay | Admitting: Hematology & Oncology

## 2014-08-12 ENCOUNTER — Other Ambulatory Visit: Payer: Self-pay | Admitting: *Deleted

## 2014-08-12 DIAGNOSIS — C3490 Malignant neoplasm of unspecified part of unspecified bronchus or lung: Secondary | ICD-10-CM

## 2014-08-12 NOTE — Telephone Encounter (Signed)
Pt been calling the ofc all morning not saying anything on the phone. She is either butt dialing or not aware she is calling the cancer center. I got in touch with her brother to make him aware and he gave me the number to the nursing facility she is residing at this time. I called them and was transferred to a nurse and she said she will let the patient know she was calling the ofc sporadically.   P: [phone]

## 2014-08-15 ENCOUNTER — Encounter: Payer: Medicaid Other | Admitting: Family

## 2014-08-15 ENCOUNTER — Other Ambulatory Visit: Payer: Medicaid Other | Admitting: Lab

## 2014-08-17 ENCOUNTER — Telehealth: Payer: Self-pay | Admitting: Hematology & Oncology

## 2014-08-17 NOTE — Telephone Encounter (Signed)
Left pt message to call and reschedule missed 12-7 appointment

## 2014-09-12 ENCOUNTER — Emergency Department (HOSPITAL_COMMUNITY)
Admission: EM | Admit: 2014-09-12 | Discharge: 2014-09-12 | Disposition: A | Discharge status: Home / Self Care | Payer: Medicaid Other | Attending: Emergency Medicine | Admitting: Emergency Medicine

## 2014-09-12 ENCOUNTER — Emergency Department (HOSPITAL_COMMUNITY): Payer: Medicaid Other

## 2014-09-12 ENCOUNTER — Other Ambulatory Visit: Payer: Self-pay

## 2014-09-12 ENCOUNTER — Encounter (HOSPITAL_COMMUNITY): Payer: Self-pay | Admitting: *Deleted

## 2014-09-12 DIAGNOSIS — Y998 Other external cause status: Secondary | ICD-10-CM | POA: Insufficient documentation

## 2014-09-12 DIAGNOSIS — Z85118 Personal history of other malignant neoplasm of bronchus and lung: Secondary | ICD-10-CM | POA: Diagnosis not present

## 2014-09-12 DIAGNOSIS — Z79899 Other long term (current) drug therapy: Secondary | ICD-10-CM | POA: Diagnosis not present

## 2014-09-12 DIAGNOSIS — Z87891 Personal history of nicotine dependence: Secondary | ICD-10-CM | POA: Diagnosis not present

## 2014-09-12 DIAGNOSIS — W2210XA Striking against or struck by unspecified automobile airbag, initial encounter: Secondary | ICD-10-CM

## 2014-09-12 DIAGNOSIS — R079 Chest pain, unspecified: Secondary | ICD-10-CM

## 2014-09-12 DIAGNOSIS — Z880 Allergy status to penicillin: Secondary | ICD-10-CM | POA: Diagnosis not present

## 2014-09-12 DIAGNOSIS — Y92410 Unspecified street and highway as the place of occurrence of the external cause: Secondary | ICD-10-CM | POA: Insufficient documentation

## 2014-09-12 DIAGNOSIS — Z85841 Personal history of malignant neoplasm of brain: Secondary | ICD-10-CM | POA: Insufficient documentation

## 2014-09-12 DIAGNOSIS — Y9389 Activity, other specified: Secondary | ICD-10-CM | POA: Diagnosis not present

## 2014-09-12 DIAGNOSIS — Z9889 Other specified postprocedural states: Secondary | ICD-10-CM

## 2014-09-12 DIAGNOSIS — S299XXA Unspecified injury of thorax, initial encounter: Secondary | ICD-10-CM | POA: Insufficient documentation

## 2014-09-12 LAB — TROPONIN I

## 2014-09-12 LAB — BASIC METABOLIC PANEL
Anion gap: 7 (ref 5–15)
BUN: 11 mg/dL (ref 6–23)
CALCIUM: 9.5 mg/dL (ref 8.4–10.5)
CO2: 27 mmol/L (ref 19–32)
CREATININE: 0.58 mg/dL (ref 0.50–1.10)
Chloride: 103 mEq/L (ref 96–112)
GFR calc Af Amer: >90 mL/min (ref >90)
GFR calc non Af Amer: >90 mL/min (ref >90)
Glucose, Bld: 104 mg/dL — ABNORMAL HIGH (ref 70–99)
Potassium: 4.3 mmol/L (ref 3.5–5.1)
SODIUM: 137 mmol/L (ref 135–145)

## 2014-09-12 LAB — CBC
HCT: 45.2 % (ref 36.0–46.0)
Hemoglobin: 14.2 g/dL (ref 12.0–15.0)
MCH: 27.5 pg (ref 26.0–34.0)
MCHC: 31.4 g/dL (ref 30.0–36.0)
MCV: 87.6 fL (ref 78.0–100.0)
Platelets: 312 K/uL (ref 150–400)
RBC: 5.16 MIL/uL — ABNORMAL HIGH (ref 3.87–5.11)
RDW: 14.3 % (ref 11.5–15.5)
WBC: 9 K/uL (ref 4.0–10.5)

## 2014-09-12 NOTE — ED Notes (Signed)
Patient left without discharge paperwork and signing.
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
## 2014-09-12 NOTE — ED Notes (Signed)
Blood and EKG ordered- patient just went to CT- will report to oncoming shift.
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
## 2014-09-14 ENCOUNTER — Encounter: Payer: Self-pay | Admitting: Oncology

## 2014-09-15 ENCOUNTER — Telehealth: Payer: Self-pay | Admitting: Oncology

## 2014-09-15 ENCOUNTER — Ambulatory Visit: Admission: RE | Admit: 2014-09-15 | Payer: Medicaid Other | Source: Ambulatory Visit | Admitting: Radiation Oncology

## 2014-09-15 NOTE — Telephone Encounter (Signed)
Left message regarding Ms. Tomer follow up apt today with Dr. Sondra Come.  Requested a return call.

## 2014-09-15 NOTE — Telephone Encounter (Signed)
Called and spoke to [name] and transferred her to Santiago Glad, Art therapist to reschedule her appointment.

## 2014-10-06 ENCOUNTER — Encounter: Payer: Self-pay | Admitting: Radiation Oncology

## 2014-10-06 ENCOUNTER — Ambulatory Visit
Admission: RE | Admit: 2014-10-06 | Discharge: 2014-10-06 | Disposition: A | Discharge status: Home / Self Care | Payer: No Typology Code available for payment source | Source: Ambulatory Visit | Attending: Radiation Oncology | Admitting: Radiation Oncology

## 2014-10-06 ENCOUNTER — Encounter: Payer: Self-pay | Admitting: *Deleted

## 2014-10-06 VITALS — BP 119/77 | HR 93 | Temp 98.7°F | Resp 20 | Ht 61.0 in | Wt 116.3 lb

## 2014-10-06 DIAGNOSIS — C3490 Malignant neoplasm of unspecified part of unspecified bronchus or lung: Secondary | ICD-10-CM

## 2014-10-06 DIAGNOSIS — D496 Neoplasm of unspecified behavior of brain: Secondary | ICD-10-CM
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
## 2014-10-06 NOTE — Progress Notes (Signed)
[name] appears very fatigued and weak today.  BP Low today as compared to 09/12/14 and has a low grade fever of 100.1 presently.  She denies any nausea/vomiting, headaches, nor blurred vision.  Travel by wheelchair.  Caregiver states that her current medication list does not match what [name] is taking at home, but her caregiver cannot recount the actual medications she has at home.

## 2014-10-06 NOTE — Progress Notes (Signed)
caled [place] worker to help patient and daughter with certain needs per Dr. Sondra Come, patient was d/c hospital and doesn't have a Primary MD, and other needs to be identified, infomred MD and family,patient [name] will be down shortly 1:32 PM
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
## 2014-10-07 ENCOUNTER — Encounter: Payer: Self-pay | Admitting: *Deleted

## 2014-10-07 NOTE — Progress Notes (Signed)
[place] Clinical Social Work  Clinical Social Work received follow up call from [place] worker, Ulice Bold who will be evaluating concerns. Her contact is [phone]. She was updated that [facility] services were also ordered. CSW to cooperate with DSS and other agencies as appropriate.    Loren Racer, [place] Worker [place]  [place] Phone: [phone] Fax: [phone]

## 2014-10-10 ENCOUNTER — Telehealth: Payer: Self-pay | Admitting: Oncology

## 2014-10-10 NOTE — Telephone Encounter (Signed)
[name] from [facility] called and said that [name] does not qualify for home care.  [name] said that after speaking to her caregiver, she would only need help with one medication and needs to have her BP taken 2-3 times a week.  She reported that [name] was ambulating by herself in the home and was able to dress herself.  [name] could not find any reason to admit her for nursing care.  When asked about a social work assessment, [name] said [name] would have to be admitted for nursing care.  The social worker is not able to come out by herself.

## 2014-10-12 ENCOUNTER — Other Ambulatory Visit: Payer: Self-pay | Admitting: Oncology

## 2014-10-12 DIAGNOSIS — C3490 Malignant neoplasm of unspecified part of unspecified bronchus or lung: Secondary | ICD-10-CM

## 2014-10-12 DIAGNOSIS — D496 Neoplasm of unspecified behavior of brain: Secondary | ICD-10-CM

## 2014-10-14 ENCOUNTER — Encounter: Payer: Self-pay | Admitting: *Deleted

## 2014-10-14 NOTE — Progress Notes (Signed)
[place] Work  Clinical Social Work was referred by nurse for assessment of psychosocial needs.  Clinical Social Worker contacted patient and caregiver to offer support and re-assess for needs.  Pt and caregiver report they did not "click well" with hh rn that was sent to the home. Pt was able to ambulate well that day and was not deemed homebound per [facility] guidelines. They did get hospital bed in place and are working with [facility]. They "really liked" the APS worker who will continue to follow. Pt and caregiver have not followed up to date to get a PCP and CSW encouraged them to reach out to providers. CSW suggested Family Medicine at [facility] as they have CSW on staff as well. They plan to follow up as needed and are aware of how to seek out assistance.  Clinical Social Work interventions: Resource allocation  Loren Racer, [place] Worker [place] Phone: [phone] Fax: [phone]

## 2014-10-23 ENCOUNTER — Encounter (HOSPITAL_COMMUNITY): Payer: Self-pay | Admitting: *Deleted

## 2014-10-23 ENCOUNTER — Emergency Department (HOSPITAL_COMMUNITY): Payer: Medicaid Other

## 2014-10-23 ENCOUNTER — Emergency Department (HOSPITAL_COMMUNITY)
Admission: EM | Admit: 2014-10-23 | Discharge: 2014-10-24 | Disposition: A | Discharge status: Home / Self Care | Payer: Medicaid Other | Attending: Emergency Medicine | Admitting: Emergency Medicine

## 2014-10-23 DIAGNOSIS — Z87891 Personal history of nicotine dependence: Secondary | ICD-10-CM | POA: Diagnosis not present

## 2014-10-23 DIAGNOSIS — R569 Unspecified convulsions: Secondary | ICD-10-CM | POA: Diagnosis present

## 2014-10-23 DIAGNOSIS — Z880 Allergy status to penicillin: Secondary | ICD-10-CM | POA: Diagnosis not present

## 2014-10-23 DIAGNOSIS — G40909 Epilepsy, unspecified, not intractable, without status epilepticus: Secondary | ICD-10-CM | POA: Diagnosis not present

## 2014-10-23 DIAGNOSIS — Z79899 Other long term (current) drug therapy: Secondary | ICD-10-CM | POA: Insufficient documentation

## 2014-10-23 DIAGNOSIS — Z8669 Personal history of other diseases of the nervous system and sense organs: Secondary | ICD-10-CM | POA: Insufficient documentation

## 2014-10-23 DIAGNOSIS — R404 Transient alteration of awareness: Secondary | ICD-10-CM

## 2014-10-23 DIAGNOSIS — Z85118 Personal history of other malignant neoplasm of bronchus and lung: Secondary | ICD-10-CM | POA: Insufficient documentation

## 2014-10-23 DIAGNOSIS — C719 Malignant neoplasm of brain, unspecified: Secondary | ICD-10-CM | POA: Insufficient documentation

## 2014-10-23 DIAGNOSIS — D496 Neoplasm of unspecified behavior of brain: Secondary | ICD-10-CM

## 2014-10-23 HISTORY — DX: Unspecified convulsions: R56.9

## 2014-10-23 LAB — URINALYSIS, ROUTINE W REFLEX MICROSCOPIC
Bilirubin Urine: NEGATIVE
Glucose, UA: NEGATIVE mg/dL
HGB URINE DIPSTICK: NEGATIVE
Ketones, ur: NEGATIVE mg/dL
Leukocytes, UA: NEGATIVE
Nitrite: NEGATIVE
Protein, ur: NEGATIVE mg/dL
Specific Gravity, Urine: 1.009 (ref 1.005–1.030)
UROBILINOGEN UA: 0.2 mg/dL (ref 0.0–1.0)
pH: 6 (ref 5.0–8.0)

## 2014-10-23 LAB — CBC
HEMATOCRIT: 39.7 % (ref 36.0–46.0)
HEMOGLOBIN: 13.6 g/dL (ref 12.0–15.0)
MCH: 28.2 pg (ref 26.0–34.0)
MCHC: 34.3 g/dL (ref 30.0–36.0)
MCV: 82.2 fL (ref 78.0–100.0)
Platelets: 261 10*3/uL (ref 150–400)
RBC: 4.83 MIL/uL (ref 3.87–5.11)
RDW: 13.6 % (ref 11.5–15.5)
WBC: 7.4 10*3/uL (ref 4.0–10.5)

## 2014-10-23 LAB — I-STAT CHEM 8, ED
BUN: 10 mg/dL (ref 6–23)
Calcium, Ion: 1.15 mmol/L (ref 1.13–1.30)
Chloride: 98 mmol/L (ref 96–112)
Creatinine, Ser: 0.8 mg/dL (ref 0.50–1.10)
Glucose, Bld: 95 mg/dL (ref 70–99)
HEMATOCRIT: 45 % (ref 36.0–46.0)
Hemoglobin: 15.3 g/dL — ABNORMAL HIGH (ref 12.0–15.0)
POTASSIUM: 3.9 mmol/L (ref 3.5–5.1)
Sodium: 136 mmol/L (ref 135–145)
TCO2: 22 mmol/L (ref 0–100)

## 2014-10-23 LAB — PROTIME-INR
INR: 1.85 — AB (ref 0.00–1.49)
Prothrombin Time: 21.5 seconds — ABNORMAL HIGH (ref 11.6–15.2)

## 2014-10-23 LAB — I-STAT TROPONIN, ED: TROPONIN I, POC: 0 ng/mL (ref 0.00–0.08)

## 2014-10-23 LAB — CBG MONITORING, ED: Glucose-Capillary: 84 mg/dL (ref 70–99)

## 2014-10-23 NOTE — ED Provider Notes (Signed)
CSN: [id number]     Arrival date & time 10/23/14  2132 History   First MD Initiated Contact with Patient 10/23/14 2141     Chief Complaint  Patient presents with  . Seizures    An emergency department physician performed an initial assessment on this suspected stroke patient at 2135. (Consider location/radiation/quality/duration/timing/severity/associated sxs/prior Treatment) The history is provided by the patient.    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 Speech difficulty resolved by time of arrival. Last normal 2 hours ago, though pt now back to baseline. On arrival, pt states she "feels fine." Unable to recall her difficulty speaking. Denies any recent head trauma. She does not think she has been taking her meds as prescribed. Denies any focal numbness or weakness. No fevers, no abdominal pain. Patient states she has a h/o similar episodes from her cancer and seizures. No other medical complaints.  Past Medical History  Diagnosis Date  . Bipolar disorder   . Cancer     small call lung cancer  . Radiation 03/05/2007 through 04/21/2007    59.4 gray directed at the left central chest  . Radiation 07/15/2007 through 08/10/2007    prophylactic cranial irradiation totally 32.4 gray   . Brain cancer     lung ca with mets to the brain  . Radiation 07/18/14-08/02/14    whole brain 20 gray  . Seizures    Past Surgical History  Procedure Laterality Date  . Craniotomy Left 06/23/2014    Procedure: Left craniotomy for tumor resection;  Surgeon: Newman Pies, MD;  Location: [facility];  Service: Neurosurgery;  Laterality: Left;  Left craniotomy for tumor resection   Family History  Problem Relation Age of Onset  . Cancer Brother     unknown type   History  Substance Use Topics  .  Smoking status: Former Smoker -- 1.50 packs/day  . Smokeless tobacco: Never Used  . Alcohol Use: No   OB History    No data available     Review of Systems  Constitutional: Negative for fever, chills and fatigue.  HENT: Negative for congestion, rhinorrhea and sore throat.   Eyes: Negative for visual disturbance.  Respiratory: Negative for cough, shortness of breath and wheezing.   Cardiovascular: Negative for chest pain and leg swelling.  Gastrointestinal: Negative for nausea, vomiting, abdominal pain and diarrhea.  Genitourinary: Negative for flank pain.  Musculoskeletal: Negative for neck pain and neck stiffness.  Skin: Negative for rash.  Allergic/Immunologic: Negative for immunocompromised state.  Neurological: Positive for speech difficulty (transient, resolved). Negative for dizziness, weakness, light-headedness, numbness and headaches.  Psychiatric/Behavioral: Positive for confusion (transient, resolved).      Allergies  Penicillins  Home Medications   Prior to Admission medications   Medication Sig Start Date End Date Taking? Authorizing Provider  levETIRAcetam (KEPPRA) 500 MG tablet Take 1 tablet (500 mg total) by mouth 2 (two) times daily. 06/28/14  Yes Newman Pies, MD  dexamethasone (DECADRON) 2 MG tablet Take 1 tablet (2 mg total) by mouth every 12 (twelve) hours. Patient not taking: Reported on 08/02/2014 06/28/14   Newman Pies, MD  docusate sodium 100 MG CAPS Take 100 mg by mouth 2 (two) times daily. Patient not taking: Reported on 08/02/2014 06/28/14   Newman Pies, MD  HYDROcodone-acetaminophen (NORCO/VICODIN) 5-325 MG per tablet Take  1 tablet by mouth every 4 (four) hours as needed for moderate pain. Patient not taking: Reported on 10/06/2014 06/28/14   Newman Pies, MD  pantoprazole (PROTONIX) 40 MG tablet Take 1 tablet (40 mg total) by mouth daily at 12 noon. Patient not taking: Reported on 10/06/2014 06/28/14   Newman Pies, MD   BP 135/89  mmHg  Pulse 86  Temp(Src) 98.6 F (37 C) (Oral)  Resp 10  Ht 5\' 3"  (1.6 m)  Wt 116 lb (52.617 kg)  BMI 20.55 kg/m2  SpO2 100% Physical Exam  Constitutional: She is oriented to person, place, and time. She appears well-developed and well-nourished. No distress.  HENT:  Head: Normocephalic and atraumatic.  Mouth/Throat: Oropharynx is clear and moist. No oropharyngeal exudate.  Eyes: Conjunctivae are normal. Pupils are equal, round, and reactive to light.  Neck: Normal range of motion. Neck supple.  Cardiovascular: Normal rate, regular rhythm, normal heart sounds and intact distal pulses.  Exam reveals no friction rub.   No murmur heard. Pulmonary/Chest: Effort normal and breath sounds normal. No respiratory distress. She has no wheezes. She has no rales.  Abdominal: Soft. She exhibits no distension. There is no tenderness.  Neurological: She is oriented to person, place, and time. She has normal strength. She displays normal reflexes. No cranial nerve deficit or sensory deficit. She exhibits normal muscle tone. Coordination normal. GCS eye subscore is 4. GCS verbal subscore is 5. GCS motor subscore is 6.  Skin: No rash noted.  Nursing note and vitals reviewed.   ED Course  Procedures (including critical care time) Labs Review Labs Reviewed  PROTIME-INR - Abnormal; Notable for the following:    Prothrombin Time 21.5 (*)    INR 1.85 (*)    All other components within normal limits  I-STAT CHEM 8, ED - Abnormal; Notable for the following:    Hemoglobin 15.3 (*)    All other components within normal limits  CBC  URINALYSIS, ROUTINE W REFLEX MICROSCOPIC  CBG MONITORING, ED  I-STAT TROPOININ, ED  I-STAT CHEM 8, ED  CBG MONITORING, ED    Imaging Review Ct Head (brain) Wo Contrast  10/23/2014   CLINICAL DATA:  Acute onset of the inability to speak this evening.  EXAM: CT HEAD WITHOUT CONTRAST  TECHNIQUE: Contiguous axial images were obtained from the base of the skull through the  vertex without intravenous contrast.  COMPARISON:  Brain MRI 06/24/2014.  Head CT scan 09/12/2014.  FINDINGS: There is cortical atrophy and chronic microvascular ischemic change. Left frontal and parietal craniotomy defects are again seen. No fracture is identified. There is no evidence of acute intracranial abnormality including hemorrhage, infarct, mass lesion, mass effect, midline shift or abnormal extra-axial fluid collection. Atrophy and chronic microvascular ischemic change are noted.  IMPRESSION: No acute intracranial abnormality.  Atrophy and chronic microvascular ischemic change.  Left frontal and parietal craniotomy defects for tumor resection.   Electronically Signed   By: Inge Rise M.D.   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 On arrival, T 98.73F, HR 86, RR 14, BP 133/78, satting 100% on RA. Exam as above, pt overall well-appearing and in NAD, with  normal phonation, non-focal neuro exam. Remainder as above.  Pt's presentation is most c/w possible seizure with mild post-ictal state, now completely resolved. Pt has h/o similar episodes since her brain irradiation and tumor resection. DDx includes new metastases, metastatic hemorrhage/bleed, and will send for stat CT head though pt now neuro intact and at baseline. Of note, this occurred after an argument with a family member and there may be a behavioral component as well. No evidence of CVA on exam. Will also check basic labs, urine, CXR to eval for alternative etiologies such as occult infection or metabolic derangement.  Labs reviewed as above. CBC with no leukocytosis or anemia. INR 1.85, but pt not on blood  thinners - suspect 2/2 lab error but will send LFTs. Troponin negative and EKG shows no acute ischemia or infarct. POC glucose WNL and Chem 8 unremarkable. UA pending. CT Head shows no acute abnormality - specifically, no evidence of new mass, mass effect, hemorrhage, or edema. Family is now at bedside and states pt has not been adherent with her medications. Primary suspicion at this time is seizure 2/2 non-adherence to medication regimen. Will load with keppra here and f/u UA.  UA negative. CXR with no signs of infection. Pt remains HDS and at baseline here. LFTs normal with no evidence of hepatitis, and albumin 3.3 but increased from prior. Discussed labs/imaging with family. Given normal labs, no infectious etiology, return to baseline, negative CT Head, and non-focal neuro exam, with known trigger of non-adherence, believe pt is at baseline and safe for d/c home. Discussed management options with pt and family and they prefer and elect to return home. They confirm pt is at baseline and admit this has happened multiple times, but today was just "a little worse." I reiterated the importance of adherence as well as close f/u with pt's oncologist as well as PCP. Per review of records, pt has not had PCP f/u and will refer to Wellness center. Pt and family in agreement. Will refill Keppra, PPI and d/c home.  Clinical Impression: 1. Brain tumor   2. Transient alteration of awareness     Disposition: Discharge  Condition: Good  I have discussed the results, Dx and Tx plan with the pt(& family if present). He/she/they expressed understanding and agree(s) with the plan. Discharge instructions discussed at great length. Strict return precautions discussed and pt &/or family have verbalized understanding of the instructions. No further questions at time of discharge.    Follow Up: [place] [address] [phone]  It is  VERY important that you set up an appointment with a PCP for management of your/Gracey's many medications. Call this number to set up an appointment ASAP.  [place] [address] [id number] [place] [phone]  As needed, If symptoms worsen   Pt seen in conjunction with Dr. Marlynn Perking, MD 10/24/14 1317  Duffy Bruce, MD 10/24/14 [place], MD 10/25/14 [phone]

## 2014-10-23 NOTE — ED Notes (Signed)
Pt. Was arguing with daughter at 57 and an hour later pt. Was unable to communicate. EMS called code stroke initiated. Pt. Has hx of seizures and cancer.

## 2014-10-23 NOTE — ED Notes (Signed)
CBG 84

## 2014-10-24 ENCOUNTER — Emergency Department (HOSPITAL_COMMUNITY): Payer: Medicaid Other

## 2014-10-24 LAB — HEPATIC FUNCTION PANEL
ALBUMIN: 3.3 g/dL — AB (ref 3.5–5.2)
ALK PHOS: 75 U/L (ref 39–117)
ALT: 10 U/L (ref 0–35)
AST: 11 U/L (ref 0–37)
Bilirubin, Direct: 0.1 mg/dL (ref 0.0–0.5)
Indirect Bilirubin: 0.3 mg/dL (ref 0.3–0.9)
TOTAL PROTEIN: 6.4 g/dL (ref 6.0–8.3)
Total Bilirubin: 0.4 mg/dL (ref 0.3–1.2)

## 2014-10-24 MED ORDER — LEVETIRACETAM 500 MG PO TABS: 500.0000 mg | ORAL_TABLET | Freq: Two times a day (BID) | ORAL | Status: DC

## 2014-10-24 MED ORDER — OMEPRAZOLE 20 MG PO CPDR: 20.0000 mg | DELAYED_RELEASE_CAPSULE | Freq: Every day | ORAL | Status: AC

## 2014-10-24 MED ORDER — LEVETIRACETAM 500 MG PO TABS
1000.0000 mg | ORAL_TABLET | Freq: Once | ORAL | Status: AC
  Administered 2014-10-24: 1000 mg via ORAL
  Filled 2014-10-24: qty 2

## 2014-10-24 NOTE — Progress Notes (Signed)
Error

## 2014-10-24 NOTE — Discharge Instructions (Signed)

## 2014-10-26 ENCOUNTER — Telehealth: Payer: Self-pay | Admitting: *Deleted

## 2014-10-26 ENCOUNTER — Ambulatory Visit: Payer: Medicaid Other | Attending: Physician Assistant | Admitting: Physician Assistant

## 2014-10-26 VITALS — BP 110/80 | HR 101 | Temp 98.8°F | Resp 16 | Ht 60.0 in | Wt 106.6 lb

## 2014-10-26 DIAGNOSIS — Z923 Personal history of irradiation: Secondary | ICD-10-CM | POA: Insufficient documentation

## 2014-10-26 DIAGNOSIS — F319 Bipolar disorder, unspecified: Secondary | ICD-10-CM | POA: Insufficient documentation

## 2014-10-26 DIAGNOSIS — Z85841 Personal history of malignant neoplasm of brain: Secondary | ICD-10-CM | POA: Insufficient documentation

## 2014-10-26 DIAGNOSIS — R569 Unspecified convulsions: Secondary | ICD-10-CM | POA: Diagnosis not present

## 2014-10-26 DIAGNOSIS — Z85118 Personal history of other malignant neoplasm of bronchus and lung: Secondary | ICD-10-CM | POA: Insufficient documentation

## 2014-10-26 DIAGNOSIS — Z79899 Other long term (current) drug therapy: Secondary | ICD-10-CM | POA: Insufficient documentation

## 2014-10-26 NOTE — Telephone Encounter (Signed)
Pharmacy called for DEA number of Dr. Duffy Bruce.  NCM explained that Dr. Ellender Hose is resident and does not have [place] number.  NCM provided Dr Hildred Alamin DEA number.

## 2014-10-26 NOTE — Progress Notes (Addendum)
[name]  GMW:[id number]  YQI:[id number]  DOB - 03-Sep-1951  Chief Complaint  Patient presents with  . Follow-up  . Cancer       Subjective:   [name] is a 64 y.o. female here today for establishing care. She was in the emergency department on February 14. At that time she presented with confusion and inability to speak. They suspected a CVA. Her workup was negative. It was declared that she likely had a post ictal state from a recent seizure. CT of the head, chest x-ray, labs and EKG were within normal limits. She has a lot of social issues. She is presenting here with a friend.  Since discharge she has not had any dizziness. She's not had any headaches. She's had no seizure. She does need her medications refilled..    ROS: GEN: denies fever or chills, denies change in weight Skin: denies lesions or rashes HEENT: denies headache, earache, epistaxis, sore throat, or neck pain LUNGS: denies SHOB, dyspnea, PND, orthopnea CV: denies CP or palpitations ABD: denies abd pain, N or V EXT: denies muscle spasms or swelling; no pain in lower ext, no weakness NEURO: denies numbness or tingling, denies sz, stroke or TIA  ALLERGIES: Allergies  Allergen Reactions  . Penicillins Hives and Rash    PAST MEDICAL HISTORY: Past Medical History  Diagnosis Date  . Bipolar disorder   . Cancer     small call lung cancer  . Radiation 03/05/2007 through 04/21/2007    59.4 gray directed at the left central chest  . Radiation 07/15/2007 through 08/10/2007    prophylactic cranial irradiation totally 32.4 gray   . Brain cancer     lung ca with mets to the brain  . Radiation 07/18/14-08/02/14    whole brain 20 gray  . Seizures     PAST SURGICAL HISTORY: Past Surgical History  Procedure Laterality Date  . Craniotomy Left 06/23/2014    Procedure: Left craniotomy for tumor resection;  Surgeon: Newman Pies, MD;  Location: [facility];  Service: Neurosurgery;  Laterality: Left;  Left  craniotomy for tumor resection    MEDICATIONS AT HOME: Prior to Admission medications   Medication Sig Start Date End Date Taking? Authorizing Provider  dexamethasone (DECADRON) 2 MG tablet Take 1 tablet (2 mg total) by mouth every 12 (twelve) hours. Patient not taking: Reported on 08/02/2014 06/28/14   Newman Pies, MD  docusate sodium 100 MG CAPS Take 100 mg by mouth 2 (two) times daily. Patient not taking: Reported on 08/02/2014 06/28/14   Newman Pies, MD  HYDROcodone-acetaminophen (NORCO/VICODIN) 5-325 MG per tablet Take 1 tablet by mouth every 4 (four) hours as needed for moderate pain. Patient not taking: Reported on 10/06/2014 06/28/14   Newman Pies, MD  levETIRAcetam (KEPPRA) 500 MG tablet Take 1 tablet (500 mg total) by mouth 2 (two) times daily. 10/24/14   Duffy Bruce, MD  omeprazole (PRILOSEC) 20 MG capsule Take 1 capsule (20 mg total) by mouth daily. 10/24/14   Duffy Bruce, MD  pantoprazole (PROTONIX) 40 MG tablet Take 1 tablet (40 mg total) by mouth daily at 12 noon. Patient not taking: Reported on 10/06/2014 06/28/14   Newman Pies, MD     Objective:   Filed Vitals:   10/26/14 0921  BP: 110/80  Pulse: 101  Temp: 98.8 F (37.1 C)  TempSrc: Oral  Resp: 16  Height: 5' (1.524 m)  Weight: 106 lb 9.6 oz (48.353 kg)  SpO2: 99%    Exam General appearance :  Awake, alert, not in any distress. Speech Clear. Not toxic looking HEENT: Atraumatic and Normocephalic, pupils equally reactive to light and accomodation Neck: supple, no JVD. No cervical lymphadenopathy.  Chest:Good air entry bilaterally, no added sounds  CVS: S1 S2 regular, no murmurs.  Abdomen: Bowel sounds present, Non tender and not distended with no gaurding, rigidity or rebound. Extremities: B/L Lower Ext shows no edema, both legs are warm to touch Neurology: Awake alert, and oriented X 3, CN II-XII intact, Non focal Skin:No Rash Wounds:N/A  Data Review No results found for:  HGBA1C   Assessment & Plan  1. Seizures  -Continue current meds  -Home health referral  -Refill medications 2. History of metastatic lung cancer  -Keep him oncology and radiation oncology appointments  -Smoking cessation canceled  -Physical therapy 3. Bipolar disorder  -Continue current meds -      Return in about 3 weeks (around 11/16/2014).  The patient was given clear instructions to go to ER or return to medical center if symptoms don't improve, worsen or new problems develop. The patient verbalized understanding. The patient was told to call to get lab results if they haven't heard anything in the next week.   This note has been created with Surveyor, quantity. Any transcriptional errors are unintentional.    Zettie Pho, PA-C [facility] and [place], [place]   10/26/2014, 12:56 PM   Evaluation and management procedures were performed by the Advanced Practitioner under my supervision and collaboration. I have reviewed the Advanced Practitioner's note and chart, and I agree with the management and plan.   Angelica Chessman, MD, [place], [place], [place] and [place], [place]   11/15/2014, 11:33 AM

## 2014-10-26 NOTE — Progress Notes (Signed)
Pt presents to clinic for follow up, pt states she needs home healthcare.

## 2014-11-04 ENCOUNTER — Encounter: Payer: Self-pay | Admitting: *Deleted

## 2014-11-04 ENCOUNTER — Telehealth: Payer: Self-pay | Admitting: General Practice

## 2014-11-04 NOTE — Telephone Encounter (Addendum)
Error

## 2014-11-04 NOTE — Progress Notes (Signed)
[place] Clinical Social Work  Clinical Social Work received follow up call from [employer], case worker with Brett Fairy. Adult Protective Services regarding their findings and recommendations based on report made on  10/06/14. Per DSS worker, Ms. Portillo was found in need of their services, yet she declined them. Per DSS, [name] has current right to make her own decisions and she declined their resources/assistance at this time. If other concerns arise, they advise another report be made.   CSW aware [name] has followed up and found PCP. CSW will attempt to be available at future appointments at [facility].    Clinical Social Work interventions: Human resources officer.   Loren Racer, [place] Worker [place]  [place] Phone: [phone] Fax: [phone]

## 2014-11-04 NOTE — Telephone Encounter (Signed)
[name] from [facility] came into facility to speak to nurse about patients orders. Please f/u.

## 2014-11-14 ENCOUNTER — Telehealth: Payer: Self-pay | Admitting: Oncology

## 2014-11-14 NOTE — Telephone Encounter (Signed)
Ivin Booty called and said she had to move [name] and her brother to a motel due to not being able to remember to pay their bills.  She said that she met with the Social Security office (Ms. Simona Huh) and is needing a letter stating that [name] is not mentally capable of managing her finances.  She said that [name] is receiving home care 2 times per week.  Advised her that Dr. Sondra Come is out of the office today but will be notified when he returns.  Ivin Booty verbalized agreement.
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
## 2014-11-16 ENCOUNTER — Other Ambulatory Visit: Payer: Self-pay | Admitting: Oncology

## 2014-11-16 ENCOUNTER — Encounter: Payer: Self-pay | Admitting: *Deleted

## 2014-11-16 DIAGNOSIS — D496 Neoplasm of unspecified behavior of brain: Secondary | ICD-10-CM
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
## 2014-11-18 ENCOUNTER — Encounter: Payer: Self-pay | Admitting: *Deleted

## 2014-11-18 ENCOUNTER — Telehealth: Payer: Self-pay | Admitting: Oncology

## 2014-11-18 ENCOUNTER — Ambulatory Visit: Payer: No Typology Code available for payment source | Attending: Internal Medicine

## 2014-11-18 NOTE — Progress Notes (Signed)
[place] Work  Clinical Social Work was referred by nurse for assessment of psychosocial needs due to caregiver requesting capacity letter.  Clinical Social Worker left message for caregiver and provided suggestions for this situation. APS has closed their case currently and it sounds as pt is safe, but may need support attending to her finances currently. CSW has given caregiver suggestions in the past on how to possibly get assistance with this process. CSW shared those again via vm. CSW can continue to be available and assist as needed, but determining capacity of patient will need to occur through other community agencies and possibly the court system at this point.    Loren Racer, LCSW Clinical Social Worker [place]  [place] Phone: [phone] Fax: [phone] '

## 2014-11-18 NOTE — Telephone Encounter (Signed)
Left a message for Ivin Booty advising her to contact the [facility] and [facility] regarding the letter she had requested.  Also informed her that a social work consult has been entered for [employer].

## 2014-11-18 NOTE — Progress Notes (Signed)
[place] Clinical Social Work  Clinical Social Work received return call from [place], caregiver and reviewed options regarding payee process for pt's ss check. CSW explained requested letter may seem like "just a letter", but the process to get said letter is quite involved process. She stated understanding and plans to also contact APS about guardianship issues.   Loren Racer, [place] Worker [place]  [place] Phone: [phone] Fax: [phone]

## 2014-11-28 ENCOUNTER — Ambulatory Visit: Payer: Medicaid Other | Attending: Family Medicine | Admitting: Family Medicine

## 2014-11-28 ENCOUNTER — Telehealth (HOSPITAL_COMMUNITY): Payer: Self-pay | Admitting: *Deleted

## 2014-11-28 ENCOUNTER — Encounter: Payer: Self-pay | Admitting: Family Medicine

## 2014-11-28 VITALS — BP 120/84 | HR 104 | Temp 98.1°F | Resp 16 | Ht 60.0 in | Wt 96.0 lb

## 2014-11-28 DIAGNOSIS — Z720 Tobacco use: Secondary | ICD-10-CM | POA: Insufficient documentation

## 2014-11-28 DIAGNOSIS — Z0279 Encounter for issue of other medical certificate: Secondary | ICD-10-CM | POA: Insufficient documentation

## 2014-11-28 DIAGNOSIS — R569 Unspecified convulsions: Secondary | ICD-10-CM | POA: Diagnosis present

## 2014-11-28 DIAGNOSIS — Z0189 Encounter for other specified special examinations: Secondary | ICD-10-CM | POA: Insufficient documentation

## 2014-11-28 MED ORDER — LEVETIRACETAM 500 MG PO TABS: 500.0000 mg | ORAL_TABLET | Freq: Two times a day (BID) | ORAL | Status: AC

## 2014-11-28 NOTE — Patient Instructions (Signed)
I have placed a referral into [facility] for mental capacity evaluation. Please call into [facility] to f/u referral:  [phone]  Also I have called Ms. [place] social worker at the cancer center to help coordinate the process.  This may be a slow process as it will involve coordination with mental health and involving the court system to process the financial power of attorney paperwork.   I have refilled Keppra and checking level today.   F/u in 6 weeks for seizures and mental capacity   Dr. Adrian Blackwater

## 2014-11-28 NOTE — Assessment & Plan Note (Signed)
A: patient with questionable mental capacity to make financially and medically competent decisions. She is at risk for diversion of social security benefits. She has a trusted friend, Dorian Pod, who is willing to act as her financial power of attorney. She has 3 sons that she is estranged from. She has a brother age 63 who lives with her with reported mental health issues.  P: Psychiatric evaluation of mental capacity

## 2014-11-28 NOTE — Progress Notes (Signed)
Establish Care Pt not sure of medication  Hx Sz

## 2014-11-28 NOTE — Assessment & Plan Note (Signed)
A: stable with no seizures P: keppra level Refilled keppra Neurology referral

## 2014-11-28 NOTE — Telephone Encounter (Signed)
Dr. Adrian Blackwater called from [facility] and Wellness, left message on voice mail for patient to be scheduled for an appointment.  Tried to contact patient and Dr. Adrian Blackwater office at [phone]. Patient has Medicaid according to records, we are booked with Medicaid patient's until June.  Was calling to see if patient had Medicare because we are able to bill Medicare. Left message for patient to call back.  Left message on registration voice mail---hit option for doctor and pharmacist line--told to leave a message.
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
## 2014-12-01 LAB — LEVETIRACETAM LEVEL: KEPPRA (LEVETIRACETAM): 23.1 ug/mL

## 2014-12-09 ENCOUNTER — Telehealth: Payer: Self-pay | Admitting: *Deleted

## 2014-12-09 NOTE — Telephone Encounter (Signed)
-----   Message from Boykin Nearing, MD sent at 12/01/2014  4:49 PM EDT ----- Normal keppra level

## 2014-12-09 NOTE — Telephone Encounter (Signed)
Left message with Husband,normal labs

## 2014-12-14 ENCOUNTER — Telehealth: Payer: Self-pay | Admitting: *Deleted

## 2014-12-14 ENCOUNTER — Telehealth: Payer: Self-pay

## 2014-12-14 NOTE — Telephone Encounter (Signed)
Baldo Ash called from [facility].Patient  found by brother whom called EMS.Dr.Kinard agreed to sign death certificate.

## 2014-12-14 NOTE — Telephone Encounter (Signed)
EMS called to let us know that they were called to patient home and discovered patient was deceased.  They were wanting Dr. Adrian Blackwater to sign the death certificate but Dr. Adrian Blackwater only saw the patient one time and would prefer the case go to the medical examiner.

## 2015-01-02 ENCOUNTER — Other Ambulatory Visit: Payer: Self-pay | Admitting: Internal Medicine

## 2015-01-05 ENCOUNTER — Ambulatory Visit: Payer: No Typology Code available for payment source | Admitting: Radiation Oncology

## 2015-01-08 DEATH — deceased
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
